# Patient Record
Sex: Female | Born: 1970 | Race: Black or African American | Hispanic: No | Marital: Married | Smoking: Current every day smoker
Health system: Southern US, Community
[De-identification: ages and names within clinical notes are randomized; demographics above are authoritative.]

## PROBLEM LIST (undated history)

## (undated) DIAGNOSIS — D219 Benign neoplasm of connective and other soft tissue, unspecified: Secondary | ICD-10-CM

## (undated) DIAGNOSIS — F419 Anxiety disorder, unspecified: Secondary | ICD-10-CM

## (undated) DIAGNOSIS — D573 Sickle-cell trait: Secondary | ICD-10-CM

## (undated) DIAGNOSIS — D649 Anemia, unspecified: Secondary | ICD-10-CM

## (undated) DIAGNOSIS — J45909 Unspecified asthma, uncomplicated: Secondary | ICD-10-CM

## (undated) DIAGNOSIS — I10 Essential (primary) hypertension: Secondary | ICD-10-CM

## (undated) DIAGNOSIS — E119 Type 2 diabetes mellitus without complications: Secondary | ICD-10-CM

## (undated) HISTORY — DX: Anemia, unspecified: D64.9

## (undated) HISTORY — PX: APPENDECTOMY: SHX54

## (undated) HISTORY — DX: Benign neoplasm of connective and other soft tissue, unspecified: D21.9

## (undated) HISTORY — DX: Sickle-cell trait: D57.3

## (undated) HISTORY — PX: TUBAL LIGATION: SHX77

---

## 2004-12-01 ENCOUNTER — Emergency Department (HOSPITAL_COMMUNITY): Admission: EM | Admit: 2004-12-01 | Discharge: 2004-12-01 | Payer: Self-pay | Admitting: Emergency Medicine

## 2005-04-18 ENCOUNTER — Ambulatory Visit (HOSPITAL_COMMUNITY): Admission: RE | Admit: 2005-04-18 | Discharge: 2005-04-18 | Payer: Self-pay | Admitting: Ophthalmology

## 2005-06-14 ENCOUNTER — Ambulatory Visit (HOSPITAL_COMMUNITY): Admission: RE | Admit: 2005-06-14 | Discharge: 2005-06-14 | Payer: Self-pay | Admitting: Ophthalmology

## 2005-10-19 ENCOUNTER — Ambulatory Visit: Payer: Self-pay | Admitting: Family Medicine

## 2006-01-19 ENCOUNTER — Emergency Department (HOSPITAL_COMMUNITY): Admission: EM | Admit: 2006-01-19 | Discharge: 2006-01-19 | Payer: Self-pay | Admitting: Emergency Medicine

## 2007-09-18 ENCOUNTER — Encounter: Payer: Self-pay | Admitting: Family Medicine

## 2008-01-01 ENCOUNTER — Other Ambulatory Visit: Admission: RE | Admit: 2008-01-01 | Discharge: 2008-01-01 | Payer: Self-pay | Admitting: Family Medicine

## 2008-01-01 ENCOUNTER — Encounter: Payer: Self-pay | Admitting: Family Medicine

## 2008-01-01 ENCOUNTER — Ambulatory Visit: Payer: Self-pay | Admitting: Family Medicine

## 2008-01-07 DIAGNOSIS — Z720 Tobacco use: Secondary | ICD-10-CM

## 2008-01-07 DIAGNOSIS — E669 Obesity, unspecified: Secondary | ICD-10-CM

## 2010-10-11 ENCOUNTER — Emergency Department (HOSPITAL_COMMUNITY)
Admission: EM | Admit: 2010-10-11 | Discharge: 2010-10-12 | Payer: Self-pay | Source: Home / Self Care | Admitting: Emergency Medicine

## 2010-10-12 LAB — HEPATITIS C ANTIBODY: HCV Ab: NEGATIVE

## 2010-10-12 LAB — HIV RAPID SCREEN (BLD OR BODY FLD EXPOSURE): Rapid HIV Screen: NONREACTIVE

## 2010-10-17 NOTE — Letter (Signed)
Summary: Historic Patient File  Historic Patient File   Imported By: Lind Guest 06/16/2010 16:11:50  _____________________________________________________________________  External Attachment:    Type:   Image     Comment:   External Document

## 2010-10-17 NOTE — Letter (Signed)
Summary: rpc chart  rpc chart   Imported By: Curtis Sites 04/17/2010 13:57:11  _____________________________________________________________________  External Attachment:    Type:   Image     Comment:   External Document

## 2011-02-02 NOTE — Op Note (Signed)
[name], [name] NO.:  [id number]   MEDICAL RECORD NO.:  [id number]          PATIENT TYPE:  AMB   LOCATION:  DAY                           FACILITY:  [facility]   PHYSICIAN:  Trish Fountain, MD    DATE OF BIRTH:  12-30-1970   DATE OF PROCEDURE:  04/18/2005  DATE OF DISCHARGE:  04/18/2005                                 OPERATIVE REPORT   /PREOPERATIVE DIAGNOSIS:  Cataract, left eye.   POSTOPERATIVE DIAGNOSIS:  Cataract, left eye.   SURGERY:  Kelman phacoemulsification, left eye, with posterior chamber  intraocular lens, left eye.   ANESTHESIA:  MAC with topical anesthesia of the left eye.   SURGEON:  Trish Fountain, MD   SPECIMENS:  None.   COMPLICATIONS:  None.   HISTORY:  This is a 40 year old female who has progressively decreased  vision in the left eye.   Lens model is AMO CLRFLXC 20.0 diopter lens, serial # [id number].   DESCRIPTION OF PROCEDURE:  In the preoperative area, the patient had  Cyclogyl and Neo-Synephrine drops in the left eye in order to dilate the eye  along with Tetracaine to help anesthetize the eye.  Once the patient's left  eye was dilated, the patient was taken to the operating room and prepped.  The left eye was prepped and draped in the usual sterile manner.  A lid  speculum was placed in the left eye, and 2% Xylocaine jelly was placed in  the left eye as well.  A paracentesis was made through clear cornea at the  limbus at approximately the 5 o'clock position of the left eye.  Nonpreserved Xylocaine 1% 1 cc was placed into the anterior chamber for one  minute.  Viscoat was then used to fill the anterior chamber.  Using a 2.75  mm blade at the 3 o'clock position, an incision into the anterior chamber  was made through clear cornea near the limbus.  Viscoat was again used to  reform the anterior chamber.  A 25 gauge bent capsulotomy needle was used to  begin the capsulorrhexis through the anterior capsule of the lens.   Utrata  forceps were used to make a 360 degree anterior capsulorrhexis.  A Chang 27  gauge irrigating cannula was used to hydrodissect and hydrodelineate the  nucleus.  Once hydrodissection and hydrodelineation was carried out, [facility]  phacoemulsification was used to make a deep groove in the lens nucleus.  The  nucleus was very soft and minimal phaco power and suction were used to  remove the remainder of the lens nucleus.  The irrigation/aspiration was  then used to remove the remainder of the cortex.  An olive-tipped capsule  polisher was used to clean the posterior capsule. Most of the posterior  capsular opacity was adherent to the posterior capsule and was unable to be  removed. The anterior chamber and posterior capsule was filled with Provisc,  and the 3 o'clock position incision was slightly widened, using the same  2.75 mm blade that was initially used to make the incision.  An intraocular  lens was placed in  the shooter, and this was placed in the eye, followed by  placement of the trailing haptic into the posterior capsule, using the  Kugelan.  Irrigation/aspiration was then used to remove Provisc from the  anterior chamber and the posterior capsule.  BSS on a syringe was then used  to hydrate the cornea at the 3 o'clock incision site.  The incision site was  then checked for water tightness, using a Weck-cel.  Half-strength Betadine  solution was placed, 1 drop, in the inner canthus, and 1 drop in the outer  canthus.  After one minute, this was rinsed from the eye.  Drops were placed  in the eye, Vigamox, followed by Nevanac followed by Econopred.  A shield  was placed over the patient's left eye, and the patient was sent to the  recovery room in satisfactory condition.       PVK/MEDQ  D:  04/19/2005  T:  04/20/2005  Job:  [id number]

## 2011-02-02 NOTE — Op Note (Signed)
[name], [name]   MEDICAL RECORD NO.:  [name]          PATIENT TYPE:  AMB   LOCATION:  DAY                           FACILITY:  [facility]   PHYSICIAN:  Trish Fountain, MD    DATE OF BIRTH:  Mar 28, 1971   DATE OF PROCEDURE:  06/14/2005  DATE OF DISCHARGE:                                 OPERATIVE REPORT   PREOPERATIVE DIAGNOSIS:  Cataract, right eye.   POSTOPERATIVE DIAGNOSIS:  Cataract, right eye.   SURGERY:  Kelman phacoemulsification, right eye, with posterior chamber  intraocular lens, right eye.   ANESTHESIA:  MAC with topical anesthesia of the right eye.   SURGEON:  Trish Fountain, MD   SPECIMENS:  None.   COMPLICATIONS:  None.   HISTORY:  This is a 40 year old female who has slowly progressive decrease  in vision in the right eye.   LENS MODEL:  AMO CLRFLXC 20.0 diopter lens, serial #[id number].   DESCRIPTION OF PROCEDURE:  In the preoperative area, the patient had  Cyclogyl and Neo-Synephrine drops in the right eye in order to dilate the  eye along with Tetracaine to help anesthetize the eye.  Once the patient's  right eye was dilated, the patient was taken to the operating room and  prepped.  The right eye was prepped and draped in the usual sterile manner.  A lid speculum was placed in the right eye, and 2% Xylocaine jelly was  placed in the right eye as well.  A paracentesis was made through clear  cornea at the limbus at approximately the 11 o'clock position of the right  eye.  Nonpreserved Xylocaine 1% 1 cc was placed into the anterior chamber  for one minute.  Viscoat was then used to fill the anterior chamber.  Using  a 2.75 mm blade at the 9 o'clock position, an incision into the anterior  chamber was made through clear cornea near the limbus.  Viscoat was again  used to reform the anterior chamber.  A 25 gauge bent capsulotomy needle was  used to begin the capsulorrhexis through the anterior capsule of the  lens.  Utrata forceps were used to make a 360 degree anterior capsulorrhexis.  A  Chang 27 gauge irrigating cannula was used to hydrodissect and  hydrodelineate the nucleus.  Once hydrodissection and hydrodelineation was  carried out, [facility] phacoemulsification was used to make a deep groove in  the lens nucleus.  The lens was rotated 360 degrees and divided into four  quadrants using deep grooves made by phacoemulsification with the [facility]  phacoemulsification tip.  The nucleus was then divided using the phaco tip  and the nucleus manipulator.  The nuclear quadrants were then removed using  phacoemulsification.  The irrigation aspiration was then used to remove the  remainder of the cortex.  A olive-tipped capsule polisher was used to clean  the posterior capsule.  Just as with her left eye, the posterior capsular  opacity remained and will have to be removed at a later date using the YAG  laser capsulotomy.  The anterior chamber and posterior capsule  was filled  with Provisc, and the 9 o'clock position incision was slightly widened,  using the same 2.75 mm blade that was initially used to make the incision.  An intraocular lens was placed in the shooter, and this was placed in the  eye, followed by placement of the trailing haptic into the posterior  capsule, using the Kugelan.  Irrigation/aspiration was then used to remove  Provisc from the anterior chamber and the posterior capsule.  BSS on a  syringe was then used to hydrate the cornea at the 9 o'clock incision site.  The incision site was then checked for water tightness, using a Weck-cel.  Half-strength Betadine solution was placed, 1 drop, in the inner canthus,  and 1 drop in the outer canthus.  After one minute, this was rinsed from the  eye.  Drops were placed in the eye, Vigamox, followed by Nevanac followed by  Econopred.  A shield was placed over the patient's right eye, and the  patient was sent to the recovery room in  satisfactory condition.      Trish Fountain, MD  Electronically Signed     PVK/MEDQ  D:  06/15/2005  T:  06/15/2005  Job:  [id number]

## 2013-05-12 ENCOUNTER — Emergency Department (HOSPITAL_COMMUNITY)
Admission: EM | Admit: 2013-05-12 | Discharge: 2013-05-12 | Disposition: A | Discharge status: Home / Self Care | Payer: Self-pay | Attending: Emergency Medicine | Admitting: Emergency Medicine

## 2013-05-12 ENCOUNTER — Emergency Department (HOSPITAL_COMMUNITY): Payer: Self-pay

## 2013-05-12 ENCOUNTER — Encounter (HOSPITAL_COMMUNITY): Payer: Self-pay | Admitting: *Deleted

## 2013-05-12 DIAGNOSIS — R509 Fever, unspecified: Secondary | ICD-10-CM | POA: Insufficient documentation

## 2013-05-12 DIAGNOSIS — I10 Essential (primary) hypertension: Secondary | ICD-10-CM | POA: Insufficient documentation

## 2013-05-12 DIAGNOSIS — F17200 Nicotine dependence, unspecified, uncomplicated: Secondary | ICD-10-CM | POA: Insufficient documentation

## 2013-05-12 DIAGNOSIS — R319 Hematuria, unspecified: Secondary | ICD-10-CM | POA: Insufficient documentation

## 2013-05-12 DIAGNOSIS — N938 Other specified abnormal uterine and vaginal bleeding: Secondary | ICD-10-CM

## 2013-05-12 DIAGNOSIS — Z7982 Long term (current) use of aspirin: Secondary | ICD-10-CM | POA: Insufficient documentation

## 2013-05-12 DIAGNOSIS — R42 Dizziness and giddiness: Secondary | ICD-10-CM | POA: Insufficient documentation

## 2013-05-12 DIAGNOSIS — N898 Other specified noninflammatory disorders of vagina: Secondary | ICD-10-CM | POA: Insufficient documentation

## 2013-05-12 DIAGNOSIS — N760 Acute vaginitis: Secondary | ICD-10-CM | POA: Insufficient documentation

## 2013-05-12 DIAGNOSIS — Z79899 Other long term (current) drug therapy: Secondary | ICD-10-CM | POA: Insufficient documentation

## 2013-05-12 DIAGNOSIS — A499 Bacterial infection, unspecified: Secondary | ICD-10-CM | POA: Insufficient documentation

## 2013-05-12 DIAGNOSIS — R0602 Shortness of breath: Secondary | ICD-10-CM | POA: Insufficient documentation

## 2013-05-12 DIAGNOSIS — B9689 Other specified bacterial agents as the cause of diseases classified elsewhere: Secondary | ICD-10-CM

## 2013-05-12 DIAGNOSIS — A599 Trichomoniasis, unspecified: Secondary | ICD-10-CM

## 2013-05-12 DIAGNOSIS — D259 Leiomyoma of uterus, unspecified: Secondary | ICD-10-CM

## 2013-05-12 DIAGNOSIS — D649 Anemia, unspecified: Secondary | ICD-10-CM

## 2013-05-12 DIAGNOSIS — R51 Headache: Secondary | ICD-10-CM | POA: Insufficient documentation

## 2013-05-12 DIAGNOSIS — N949 Unspecified condition associated with female genital organs and menstrual cycle: Secondary | ICD-10-CM | POA: Insufficient documentation

## 2013-05-12 HISTORY — DX: Essential (primary) hypertension: I10

## 2013-05-12 LAB — COMPREHENSIVE METABOLIC PANEL
Albumin: 3.4 g/dL — ABNORMAL LOW (ref 3.5–5.2)
Alkaline Phosphatase: 50 U/L (ref 39–117)
BUN: 10 mg/dL (ref 6–23)
Potassium: 3.9 mEq/L (ref 3.5–5.1)
Sodium: 141 mEq/L (ref 135–145)
Total Protein: 7.3 g/dL (ref 6.0–8.3)

## 2013-05-12 LAB — URINALYSIS, ROUTINE W REFLEX MICROSCOPIC
Bilirubin Urine: NEGATIVE
Ketones, ur: NEGATIVE mg/dL
Nitrite: NEGATIVE
Urobilinogen, UA: 0.2 mg/dL (ref 0.0–1.0)

## 2013-05-12 LAB — CBC WITH DIFFERENTIAL/PLATELET
Basophils Absolute: 0.1 10*3/uL (ref 0.0–0.1)
Basophils Relative: 1 % (ref 0–1)
Eosinophils Absolute: 0.3 10*3/uL (ref 0.0–0.7)
MCH: 20 pg — ABNORMAL LOW (ref 26.0–34.0)
MCHC: 31.4 g/dL (ref 30.0–36.0)
Monocytes Relative: 11 % (ref 3–12)
Neutrophils Relative %: 47 % (ref 43–77)
Platelets: 291 10*3/uL (ref 150–400)
RDW: 21.2 % — ABNORMAL HIGH (ref 11.5–15.5)

## 2013-05-12 LAB — WET PREP, GENITAL: Yeast Wet Prep HPF POC: NONE SEEN

## 2013-05-12 LAB — URINE MICROSCOPIC-ADD ON

## 2013-05-12 MED ORDER — FERROUS SULFATE 325 (65 FE) MG PO TABS: 325.0000 mg | ORAL_TABLET | Freq: Three times a day (TID) | ORAL | Status: DC

## 2013-05-12 MED ORDER — METRONIDAZOLE 500 MG PO TABS: 500.0000 mg | ORAL_TABLET | Freq: Three times a day (TID) | ORAL | Status: DC

## 2013-05-12 MED ORDER — KETOROLAC TROMETHAMINE 60 MG/2ML IM SOLN
60.0000 mg | Freq: Once | INTRAMUSCULAR | Status: AC
  Administered 2013-05-12: 60 mg via INTRAMUSCULAR
  Filled 2013-05-12: qty 2

## 2013-05-12 NOTE — ED Provider Notes (Addendum)
CSN: [id number]     Arrival date & time 05/12/13  0932 History  This chart was scribed for Ward Givens, MD,  by Ashley Jacobs, ED Scribe. The patient was seen in room APA04/APA04 and the patient's care was started at 10:07 AM    Chief Complaint  Patient presents with  . Abdominal Pain  . Hypertension   (Consider location/radiation/quality/duration/timing/severity/associated sxs/prior Treatment) Patient is a 41 y.o. female presenting with abdominal pain and hypertension. The history is provided by the patient and medical records. No language interpreter was used.  Abdominal Pain Pain quality: pressure and sharp   Onset quality:  Sudden Duration:  3 days Timing:  Constant Progression:  Worsening Chronicity:  New Relieved by:  Nothing Worsened by:  Urination, movement and position changes Associated symptoms: chills, fever, hematuria, shortness of breath and vaginal bleeding   Associated symptoms: no diarrhea, no dysuria, no nausea and no vomiting   Hypertension Associated symptoms include abdominal pain, headaches and shortness of breath.   HPI Comments: [name] is a 42 y.o. female who presents to the Emergency Department complaining of moderate RLQ abdominal pain and hypertension with onset of three days PTA . Pt describes the pain as pressure in intermittent intervals of 5 minutes and she explains that the pain is exacerbated upon lifting 10lbs spools of yarn at work or by anything she picks up or lifts. She reports associated symptoms of urgency, frequency, heavy vaginal bleeding but not dysuria. She had some hematuria yesterday.   She had ? Fever last night with chills. Pt has headaches for the past 3d that is described as pressure in left side of forehead in intermittent episodes that last only a few minutes and she states trying OTC pain medication with no improvement. Pt also reports yesterday feeling SOB and dizziness with sudden onset and nothing seems to relieve it. She  denies flank pain, nausea and vomiting. She denies hx of ovarian cysts. Pt's last menstrual cycles was one week ago. She does have a hx of tubal ligation and G4/P4/ Ab0. She has had some mild blood/discharge recently   She currently smokes 0 .5 pack of cigarettes a day and denies drinking alcohol.    Pt's goes to [facility].   Past Medical History  Diagnosis Date  . Hypertension    Past Surgical History  Procedure Laterality Date  . Tubal ligation     No family history on file. History  Substance Use Topics  . Smoking status: Current Some Day Smoker  . Smokeless tobacco: Not on file  . Alcohol Use: No  employed Lives at home Smokes 1/2 ppd   OB History   Grav Para Term Preterm Abortions TAB SAB Ect Mult Living                 Review of Systems  Constitutional: Positive for fever and chills.  Eyes: Negative for visual disturbance.  Respiratory: Positive for shortness of breath.   Gastrointestinal: Positive for abdominal pain. Negative for nausea, vomiting and diarrhea.  Genitourinary: Positive for urgency, hematuria and vaginal bleeding. Negative for dysuria and flank pain.  Neurological: Positive for dizziness and headaches. Negative for numbness.  All other systems reviewed and are negative.    Allergies  Review of patient's allergies indicates no known allergies.  Home Medications   Current Outpatient Rx  Name  Route  Sig  Dispense  Refill  . aspirin EC 81 MG tablet   Oral   Take 81  mg by mouth daily as needed for pain.         . Aspirin-Acetaminophen-Caffeine (GOODY HEADACHE PO)   Oral   Take 1 packet by mouth every 12 (twelve) hours as needed. Patient took yesterday for cramping         . Hyprom-Naphaz-Polysorb-Zn Sulf (CLEAR EYES COMPLETE) SOLN   Ophthalmic   Apply 1 drop to eye daily.         . ferrous sulfate 325 (65 FE) MG tablet   Oral   Take 1 tablet (325 mg total) by mouth 3 (three) times daily with meals.   100  tablet   0   . metroNIDAZOLE (FLAGYL) 500 MG tablet   Oral   Take 1 tablet (500 mg total) by mouth 3 (three) times daily.   21 tablet   0    BP 146/77  Pulse 63  Temp(Src) 98.5 F (36.9 C) (Oral)  Resp 20  Ht 5\' 3"  (1.6 m)  Wt 188 lb 2 oz (85.333 kg)  BMI 33.33 kg/m2  SpO2 100%  LMP 05/03/2013  Vital signs normal   Physical Exam  Nursing note and vitals reviewed. Constitutional: She is oriented to person, place, and time. She appears well-developed and well-nourished.  Non-toxic appearance. She does not appear ill. No distress.  HENT:  Head: Normocephalic and atraumatic.  Right Ear: External ear normal.  Left Ear: External ear normal.  Nose: Nose normal. No mucosal edema or rhinorrhea.  Mouth/Throat: Oropharynx is clear and moist and mucous membranes are normal. No dental abscesses or edematous.  Eyes: Conjunctivae and EOM are normal. Pupils are equal, round, and reactive to light.  Neck: Normal range of motion and full passive range of motion without pain. Neck supple.  Cardiovascular: Normal rate, regular rhythm and normal heart sounds.  Exam reveals no gallop and no friction rub.   No murmur heard. Pulmonary/Chest: Effort normal and breath sounds normal. No respiratory distress. She has no wheezes. She has no rhonchi. She has no rales. She exhibits no tenderness and no crepitus.  Abdominal: Soft. Normal appearance and bowel sounds are normal. She exhibits no distension. There is tenderness (RLQ). There is no rebound and no guarding.  Genitourinary:  Normal external genitalia, patient's uterus feels enlarged and is slightly knobby i to palpation, she is most tender over her uterus. She has mild discomfort to palpation over the right ovary, she is nontender over the left ovary, the ovaries are not easily felt. There is some white discharge.  Musculoskeletal: Normal range of motion. She exhibits no edema and no tenderness.  Moves all extremities well.  No CVA T bilaterally    Neurological: She is alert and oriented to person, place, and time. She has normal strength. No cranial nerve deficit.  Skin: Skin is warm, dry and intact. No rash noted. No erythema. No pallor.  Psychiatric: She has a normal mood and affect. Her speech is normal and behavior is normal. Her mood appears not anxious.    ED Course  Procedures (including critical care time) Medications  ketorolac (TORADOL) injection 60 mg (60 mg Intramuscular Given 05/12/13 1034)    DIAGNOSTIC STUDIES: Oxygen Saturation is 100% on room air, normal by my interpretation.    COORDINATION OF CARE: 10:16 AM Discussed course of care with pt which includes CT scan, EKG and ultrasound . Pt understands and agrees.  We discussed her test results, she reports heavy vaginal bleeding. She reports she bleeds through a tampon and 3 pads at one  time. She has BTL and states she has grandchildren and doesn't want any more children.   We discussed her anemia and her bleeding and lower abdominal discomfort most likely from her fibroids. She needs to discuss with GYN if she would benefit from hysterectomy.     Labs Review Results for orders placed during the hospital encounter of 05/12/13  WET PREP, GENITAL      Result Value Range   Yeast Wet Prep HPF POC NONE SEEN  NONE SEEN   Trich, Wet Prep MANY (*) NONE SEEN   Clue Cells Wet Prep HPF POC FEW (*) NONE SEEN   WBC, Wet Prep HPF POC FEW (*) NONE SEEN  URINALYSIS, ROUTINE W REFLEX MICROSCOPIC      Result Value Range   Color, Urine YELLOW  YELLOW   APPearance CLEAR  CLEAR   Specific Gravity, Urine 1.015  1.005 - 1.030   pH 7.0  5.0 - 8.0   Glucose, UA NEGATIVE  NEGATIVE mg/dL   Hgb urine dipstick NEGATIVE  NEGATIVE   Bilirubin Urine NEGATIVE  NEGATIVE   Ketones, ur NEGATIVE  NEGATIVE mg/dL   Protein, ur TRACE (*) NEGATIVE mg/dL   Urobilinogen, UA 0.2  0.0 - 1.0 mg/dL   Nitrite NEGATIVE  NEGATIVE   Leukocytes, UA NEGATIVE  NEGATIVE  CBC WITH DIFFERENTIAL       Result Value Range   WBC 6.1  4.0 - 10.5 K/uL   RBC 4.61  3.87 - 5.11 MIL/uL   Hemoglobin 9.2 (*) 12.0 - 15.0 g/dL   HCT 29.5 (*) 62.1 - 30.8 %   MCV 63.6 (*) 78.0 - 100.0 fL   MCH 20.0 (*) 26.0 - 34.0 pg   MCHC 31.4  30.0 - 36.0 g/dL   RDW 65.7 (*) 84.6 - 96.2 %   Platelets 291  150 - 400 K/uL   Neutrophils Relative % 47  43 - 77 %   Neutro Abs 2.9  1.7 - 7.7 K/uL   Lymphocytes Relative 36  12 - 46 %   Lymphs Abs 2.2  0.7 - 4.0 K/uL   Monocytes Relative 11  3 - 12 %   Monocytes Absolute 0.7  0.1 - 1.0 K/uL   Eosinophils Relative 6 (*) 0 - 5 %   Eosinophils Absolute 0.3  0.0 - 0.7 K/uL   Basophils Relative 1  0 - 1 %   Basophils Absolute 0.1  0.0 - 0.1 K/uL  COMPREHENSIVE METABOLIC PANEL      Result Value Range   Sodium 141  135 - 145 mEq/L   Potassium 3.9  3.5 - 5.1 mEq/L   Chloride 107  96 - 112 mEq/L   CO2 27  19 - 32 mEq/L   Glucose, Bld 93  70 - 99 mg/dL   BUN 10  6 - 23 mg/dL   Creatinine, Ser 9.52  0.50 - 1.10 mg/dL   Calcium 9.2  8.4 - 84.1 mg/dL   Total Protein 7.3  6.0 - 8.3 g/dL   Albumin 3.4 (*) 3.5 - 5.2 g/dL   AST 16  0 - 37 U/L   ALT 10  0 - 35 U/L   Alkaline Phosphatase 50  39 - 117 U/L   Total Bilirubin 0.2 (*) 0.3 - 1.2 mg/dL   GFR calc non Af Amer >90  >90 mL/min   GFR calc Af Amer >90  >90 mL/min  URINE MICROSCOPIC-ADD ON      Result Value Range   Squamous Epithelial /  LPF MANY (*) RARE   WBC, UA 0-2  <3 WBC/hpf   Bacteria, UA MANY (*) RARE   Laboratory interpretation all normal except Bacterial vaginosis, trichomoniasis, anemia   US Transvaginal Non-ob US Pelvis Complete  05/12/2013   *RADIOLOGY REPORT*  Clinical Data: History of right-sided pain. LMP 05/03/2013.  No hormonal therapy.  TRANSABDOMINAL AND TRANSVAGINAL ULTRASOUND OF PELVIS Technique:  Both transabdominal and transvaginal ultrasound examinations of the pelvis were performed. Transabdominal technique was performed for global imaging of the pelvis including uterus, ovaries, adnexal  regions, and pelvic cul-de-sac.  It was necessary to proceed with endovaginal exam following the transabdominal exam to visualize the  details of the endometrium and myometrium and to attempt to evaluate the ovaries.  Comparison:  None  Findings:  Uterus: There is mild uterine anteversion. The uterus measures 12.9 x 8.8 x 8.0 cm in diameter.  Multiple myometrial nodular areas are seen consistent with uterine leiomyomas which appear intramural and measure 5.7 x 5.5 x 4.9 cm, 5.2 x 5.0 x 4.2 cm, and 3.4 x 3.1 x 2.5 cm in diameter respectively.  Endometrium: Endometrial thickness measures 7.4 mm.  No endometrial mass or fluid collection is evident.  Right ovary:  Right ovary cannot be visualized.  No right adnexal lesion was identified.  Left ovary: Left ovary cannot be visualized.  No left adnexal lesion was identified.  Other findings: No free fluid was seen in the pelvis.  IMPRESSION: Uterus measures 12.9 x 8.8 x 8.0 cm.  Multiple myometrial nodular areas are seen consistent with uterine leiomyomas.  See above for measurements.  Ovaries cannot be visualized.  No adnexal masses are identified.   Original Report Authenticated By: Onalee Hua Call     Date: 05/12/2013  Rate: 51  Rhythm: sinus bradycardia  QRS Axis: normal  Intervals: normal  ST/T Wave abnormalities: normal  Conduction Disutrbances:none  Narrative Interpretation: PRWP  Old EKG Reviewed: none available      MDM   1. DUB (dysfunctional uterine bleeding)   2. Fibroid, uterine   3. BV (bacterial vaginosis)   4. Trichomoniasis   5. Anemia    Discharge Medication List as of 05/12/2013  2:04 PM    START taking these medications   Details  ferrous sulfate 325 (65 FE) MG tablet Take 1 tablet (325 mg total) by mouth 3 (three) times daily with meals., Starting 05/12/2013, Until Discontinued, Print    metroNIDAZOLE (FLAGYL) 500 MG tablet Take 1 tablet (500 mg total) by mouth 3 (three) times daily., Starting 05/12/2013, Until Discontinued,  Print        Plan discharge   Devoria Albe, MD, FACEP   I personally performed the services described in this documentation, which was scribed in my presence. The recorded information has been reviewed and considered.  Devoria Albe, MD, FACEP    Ward Givens, MD 05/12/13 1417  Ward Givens, MD 05/12/13 5409

## 2013-05-12 NOTE — ED Notes (Signed)
Pt given discharge instructions and verbalized understanding. NAD at this time.  

## 2013-05-12 NOTE — ED Notes (Addendum)
Pt presents to er with c/o dizzy spells for the past three days, right lower quad abd pain, blood in urine, increase in urine frequency for the past three days, nausea. Admits to having headache three days ago, denies any headache at present,

## 2013-05-14 LAB — GC/CHLAMYDIA PROBE AMP: CT Probe RNA: NEGATIVE

## 2013-06-09 ENCOUNTER — Ambulatory Visit (INDEPENDENT_AMBULATORY_CARE_PROVIDER_SITE_OTHER): Payer: BC Managed Care – PPO | Admitting: Obstetrics and Gynecology

## 2013-06-09 ENCOUNTER — Encounter: Payer: Self-pay | Admitting: Obstetrics and Gynecology

## 2013-06-09 VITALS — BP 150/80 | Ht 63.0 in | Wt 188.8 lb

## 2013-06-09 DIAGNOSIS — N938 Other specified abnormal uterine and vaginal bleeding: Secondary | ICD-10-CM

## 2013-06-09 DIAGNOSIS — N949 Unspecified condition associated with female genital organs and menstrual cycle: Secondary | ICD-10-CM

## 2013-06-09 DIAGNOSIS — D259 Leiomyoma of uterus, unspecified: Secondary | ICD-10-CM

## 2013-06-09 MED ORDER — MEGESTROL ACETATE 40 MG PO TABS: 40.0000 mg | ORAL_TABLET | Freq: Three times a day (TID) | ORAL | Status: DC | PRN

## 2013-06-09 NOTE — Patient Instructions (Signed)
Fibroids Fibroids are lumps (tumors) that can occur any place in a woman's body. These lumps are not cancerous. Fibroids vary in size, weight, and where they grow. HOME CARE  Do not take aspirin.  Write down the number of pads or tampons you use during your period. Tell your doctor. This can help determine the best treatment for you. GET HELP RIGHT AWAY IF:  You have pain in your lower belly (abdomen) that is not helped with medicine.  You have cramps that are not helped with medicine.  You have more bleeding between or during your period.  You feel lightheaded or pass out (faint).  Your lower belly pain gets worse. MAKE SURE YOU:  Understand these instructions.  Will watch your condition.  Will get help right away if you are not doing well or get worse. Document Released: 10/06/2010 Document Revised: 11/26/2011 Document Reviewed: 10/06/2010 [employer] Patient Information 2014 [employer].  

## 2013-06-17 DIAGNOSIS — D259 Leiomyoma of uterus, unspecified: Secondary | ICD-10-CM | POA: Insufficient documentation

## 2013-06-18 ENCOUNTER — Telehealth: Payer: Self-pay | Admitting: *Deleted

## 2013-07-08 NOTE — Progress Notes (Addendum)
Late entry: Patient was seen 06/09/2013 for dysfunctional uterine bleeding and uterine fibroids. Review of ultrasound shows approximately 400 g uterus, making medical efforts to control the irregular bleeding unlikely to be successful similarly endometrial ablation considered low success rate. Fibroids and ultrasound results reviewed with patient. She is return in 4 weeks after consideration of options  Patient was called and message left on her cell phone for patiet call the office. Do not see the 4 week visit scheduled as per discharge instructions at her last visit. The patient has called once earlier this month, and encouraged to call back

## 2013-07-09 ENCOUNTER — Telehealth: Payer: Self-pay

## 2013-07-09 NOTE — Telephone Encounter (Signed)
Per Dr. Emelda Fear pt need appt to be evaluated for symptoms. Call transferred to front staff for an appt to be made.

## 2013-07-14 ENCOUNTER — Ambulatory Visit: Payer: Self-pay | Admitting: Obstetrics and Gynecology

## 2013-07-20 ENCOUNTER — Encounter: Payer: Self-pay | Admitting: Obstetrics & Gynecology

## 2013-07-20 ENCOUNTER — Encounter (INDEPENDENT_AMBULATORY_CARE_PROVIDER_SITE_OTHER): Payer: Self-pay

## 2013-07-20 ENCOUNTER — Ambulatory Visit (INDEPENDENT_AMBULATORY_CARE_PROVIDER_SITE_OTHER): Payer: Self-pay | Admitting: Obstetrics & Gynecology

## 2013-07-20 VITALS — BP 160/90 | Ht 63.0 in | Wt 187.0 lb

## 2013-07-20 DIAGNOSIS — N946 Dysmenorrhea, unspecified: Secondary | ICD-10-CM

## 2013-07-20 DIAGNOSIS — N920 Excessive and frequent menstruation with regular cycle: Secondary | ICD-10-CM | POA: Insufficient documentation

## 2013-07-20 DIAGNOSIS — D649 Anemia, unspecified: Secondary | ICD-10-CM

## 2013-07-20 LAB — POCT HEMOGLOBIN: Hemoglobin: 9.1 g/dL — AB (ref 12.2–16.2)

## 2013-07-20 MED ORDER — HYDROCODONE-ACETAMINOPHEN 5-325 MG PO TABS: 1.0000 | ORAL_TABLET | Freq: Four times a day (QID) | ORAL | Status: DC | PRN

## 2013-07-20 NOTE — Progress Notes (Signed)
Patient ID: [name], female   DOB: 1970/10/02, 42 y.o.   MRN: [id number] I reviewed the sonogram images and none of the myomas are impinging or altering the endometrial cavity Pt would like to try and ablation even if it ultimately fails as an intermediate step in therapy Stay on megace Scheduled for 08/07/2013

## 2013-07-24 ENCOUNTER — Ambulatory Visit: Payer: Self-pay | Admitting: Obstetrics and Gynecology

## 2013-07-30 NOTE — Patient Instructions (Signed)
[name]  07/30/2013   Your procedure is scheduled on: 08/07/2013  Report to [facility] at  615  AM.  Call this number if you have problems the morning of surgery: [phone]   Remember:   Do not eat food or drink liquids after midnight.   Take these medicines the morning of surgery with A SIP OF WATER:  norco   Do not wear jewelry, make-up or nail polish.  Do not wear lotions, powders, or perfumes.   Do not shave 48 hours prior to surgery. Men may shave face and neck.  Do not bring valuables to the hospital.  [facility] is not responsible for any belongings or valuables.               Contacts, dentures or bridgework may not be worn into surgery.  Leave suitcase in the car. After surgery it may be brought to your room.  For patients admitted to the hospital, discharge time is determined by your treatment team.               Patients discharged the day of surgery will not be allowed to drive home.  Name and phone number of your driver: family  Special Instructions: Shower using CHG 2 nights before surgery and the night before surgery.  If you shower the day of surgery use CHG.  Use special wash - you have one bottle of CHG for all showers.  You should use approximately 1/3 of the bottle for each shower.   Please read over the following fact sheets that you were given: Pain Booklet, Coughing and Deep Breathing, Surgical Site Infection Prevention, Anesthesia Post-op Instructions and Care and Recovery After Surgery Hysteroscopy Hysteroscopy is a procedure used for looking inside the womb (uterus). It may be done for many different reasons, including:  To evaluate abnormal bleeding, fibroid (benign, noncancerous) tumors, polyps, scar tissue (adhesions), and possibly cancer of the uterus.  To look for lumps (tumors) and other uterine growths.  To look for causes of why a woman cannot get pregnant (infertility), causes of recurrent loss of pregnancy  (miscarriages), or a lost intrauterine device (IUD).  To perform a sterilization by blocking the fallopian tubes from inside the uterus. A hysteroscopy should be done right after a menstrual period to be sure you are not pregnant. LET YOUR CAREGIVER KNOW ABOUT:   Allergies.  Medicines taken, including herbs, eyedrops, over-the-counter medicines, and creams.  Use of steroids (by mouth or creams).  Previous problems with anesthetics or numbing medicines.  History of bleeding or blood problems.  History of blood clots.  Possibility of pregnancy, if this applies.  Previous surgery.  Other health problems. RISKS AND COMPLICATIONS   Putting a hole in the uterus.  Excessive bleeding.  Infection.  Damage to the cervix.  Injury to other organs.  Allergic reaction to medicines.  Too much fluid used in the uterus for the procedure. BEFORE THE PROCEDURE   Do not take aspirin or blood thinners for a week before the procedure, or as directed. It can cause bleeding.  Arrive at least 60 minutes before the procedure or as directed to read and sign the necessary forms.  Arrange for someone to take you home after the procedure.  If you smoke, do not smoke for 2 weeks before the procedure. PROCEDURE   Your caregiver may give you medicine to relax you. He or she may also give you a medicine that  numbs the area around the cervix (local anesthetic) or a medicine that makes you sleep (general anesthesia).  Sometimes, a medicine is placed in the cervix the day before the procedure. This medicine makes the cervix have a larger opening (dilate). This makes it easier for the instrument to be inserted into the uterus.  A small instrument (hysteroscope) is inserted through the vagina into the uterus. This instrument is similar to a pencil-sized telescope with a light.  During the procedure, air or a liquid is put into the uterus, which allows the surgeon to see better.  Sometimes, tissue  is gently scraped from inside the uterus. These tissue samples are sent to a specialist who looks at tissue samples (pathologist). The pathologist will give a report to your caregiver. This will help your caregiver decide if further treatment is necessary. The report will also help your caregiver decide on the best treatment if the test comes back abnormal. AFTER THE PROCEDURE   If you had a general anesthetic, you may be groggy for a couple hours after the procedure.  If you had a local anesthetic, you will be advised to rest at the surgical center or caregiver's office until you are stable and feel ready to go home.  You may have some cramping for a couple days.  You may have bleeding, which varies from light spotting for a few days to menstrual-like bleeding for up to 3 to 7 days. This is normal.  Have someone take you home. FINDING OUT THE RESULTS OF YOUR TEST Not all test results are available during your visit. If your test results are not back during the visit, make an appointment with your caregiver to find out the results. Do not assume everything is normal if you have not heard from your caregiver or the medical facility. It is important for you to follow up on all of your test results. HOME CARE INSTRUCTIONS   Do not drive for 24 hours or as instructed.  Only take over-the-counter or prescription medicines for pain, discomfort, or fever as directed by your caregiver.  Do not take aspirin. It can cause or aggravate bleeding.  Do not drive or drink alcohol while taking pain medicine.  You may resume your usual diet.  Do not use tampons, douche, or have sexual intercourse for 2 weeks, or as advised by your caregiver.  Rest and sleep for the first 24 to 48 hours.  Take your temperature twice a day for 4 to 5 days. Write it down. Give these temperatures to your caregiver if they are abnormal (above 98.6 F or 37.0 C).  Take medicines your caregiver has ordered as  directed.  Follow your caregiver's advice regarding diet, exercise, lifting, driving, and general activities.  Take showers instead of baths for 2 weeks, or as recommended by your caregiver.  If you develop constipation:  Take a mild laxative with the advice of your caregiver.  Eat bran foods.  Drink enough water and fluids to keep your urine clear or pale yellow.  Try to have someone with you or available to you for the first 24 to 48 hours, especially if you had a general anesthetic.  Make sure you and your family understand everything about your operation and recovery.  Follow your caregiver's advice regarding follow-up appointments and Pap smears. SEEK MEDICAL CARE IF:   You feel dizzy or lightheaded.  You feel sick to your stomach (nauseous).  You develop abnormal vaginal discharge.  You develop a rash.  You  have an abnormal reaction or allergy to your medicine.  You need stronger pain medicine. SEEK IMMEDIATE MEDICAL CARE IF:   Bleeding is heavier than a normal menstrual period or you have blood clots.  You have an oral temperature above 102 F (38.9 C), not controlled by medicine.  You have increasing cramps or pains not relieved with medicine.  You develop belly (abdominal) pain that does not seem to be related to the same area of earlier cramping and pain.  You pass out.  You develop pain in the tops of your shoulders (shoulder strap areas).  You develop shortness of breath. MAKE SURE YOU:   Understand these instructions.  Will watch your condition.  Will get help right away if you are not doing well or get worse. Document Released: 12/10/2000 Document Revised: 11/26/2011 Document Reviewed: 04/02/2013 [facility] Patient Information 2014 [place], [place]. Endometrial Ablation Endometrial ablation removes the lining of the uterus (endometrium). It is usually a same-day, outpatient treatment. Ablation helps avoid major surgery, such as surgery to remove  the cervix and uterus (hysterectomy). After endometrial ablation, you will have little or no menstrual bleeding and may not be able to have children. However, if you are premenopausal, you will need to use a reliable method of birth control following the procedure because of the small chance that pregnancy can occur. There are different reasons to have this procedure, which include:  Heavy periods.  Bleeding that is causing anemia.  Irregular bleeding.  Bleeding fibroids on the lining inside the uterus if they are smaller than 3 centimeters. This procedure should not be done if:  You want children in the future.  You have severe cramps with your menstrual period.  You have precancerous or cancerous cells in your uterus.  You were recently pregnant.  You have gone through menopause.  You have had major surgery on the uterus, such as a cesarean delivery. LET [facility] CARE PROVIDER KNOW ABOUT:  Any allergies you have.  All medicines you are taking, including vitamins, herbs, eye drops, creams, and over-the-counter medicines.  Previous problems you or members of your family have had with the use of anesthetics.  Any blood disorders you have.  Previous surgeries you have had.  Medical conditions you have. RISKS AND COMPLICATIONS  Generally, this is a safe procedure. However, as with any procedure, complications can occur. Possible complications include:  Perforation of the uterus.  Bleeding.  Infection of the uterus, bladder, or vagina.  Injury to surrounding organs.  An air bubble to the lung (air embolus).  Pregnancy following the procedure.  Failure of the procedure to help the problem, requiring hysterectomy.  Decreased ability to diagnose cancer in the lining of the uterus. BEFORE THE PROCEDURE  The lining of the uterus must be tested to make sure there is no pre-cancerous or cancer cells present.  An ultrasound may be performed to look at the size of the  uterus and to check for abnormalities.  Medicines may be given to thin the lining of the uterus. PROCEDURE  During the procedure, your health care provider will use a tool called a resectoscope to help see inside your uterus. There are different ways to remove the lining of your uterus.   Radiofrequency  This method uses a radiofrequency-alternating electric current to remove the lining of the uterus.  Cryotherapy This method uses extreme cold to freeze the lining of the uterus.  Heated-Free Liquid  This method uses heated salt (saline) solution to remove the lining of  the uterus.  Microwave This method uses high-energy microwaves to heat up the lining of the uterus to remove it.  Thermal balloon  This method involves inserting a catheter with a balloon tip into the uterus. The balloon tip is filled with heated fluid to remove the lining of the uterus. AFTER THE PROCEDURE  After your procedure, do not have sexual intercourse or insert anything into your vagina until permitted by your health care provider. After the procedure, you may experience:  Cramps.  Vaginal discharge.  Frequent urination. Document Released: 07/13/2004 Document Revised: 05/06/2013 Document Reviewed: 02/04/2013 [facility] Patient Information 2014 [place], [place]. Dilation and Curettage or Vacuum Curettage Dilation and curettage (D&C) and vacuum curettage are minor procedures. A D&C involves stretching (dilation) the cervix and scraping (curettage) the inside lining of the womb (uterus). During a D&C, tissue is gently scraped from the inside lining of the uterus. During a vacuum curettage, the lining and tissue in the uterus are removed with the use of gentle suction.  Curettage may be performed to either diagnose or treat a problem. As a diagnostic procedure, curettage is performed to examine tissues from the uterus. A diagnostic curettage may be performed for the following symptoms:   Irregular bleeding in the uterus.    Bleeding with the development of clots.   Spotting between menstrual periods.   Prolonged menstrual periods.   Bleeding after menopause.   No menstrual period (amenorrhea).   A change in size and shape of the uterus.  As a treatment procedure, curettage may be performed for the following reasons:   Removal of an IUD (intrauterine device).   Removal of retained placenta after giving birth. Retained placenta can cause an infection or bleeding severe enough to require transfusions.   Abortion.   Miscarriage.   Removal of polyps inside the uterus.   Removal of uncommon types of noncancerous lumps (fibroids).  LET [facility] CARE PROVIDER KNOW ABOUT:   Any allergies you have.   All medicines you are taking, including vitamins, herbs, eye drops, creams, and over-the-counter medicines.   Previous problems you or members of your family have had with the use of anesthetics.   Any blood disorders you have.   Previous surgeries you have had.   Medical conditions you have. RISKS AND COMPLICATIONS  Generally, this is a safe procedure. However, as with any procedure, complications can occur. Possible complications include:  Excessive bleeding.   Infection of the uterus.   Damage to the cervix.   Development of scar tissue (adhesions) inside the uterus, later causing abnormal amounts of menstrual bleeding.   Complications from the general anesthetic, if a general anesthetic is used.   Putting a hole (perforation) in the uterus. This is rare.  BEFORE THE PROCEDURE   Eat and drink before the procedure only as directed by your health care provider.   Arrange for someone to take you home.  PROCEDURE  This procedure usually takes about 15 30 minutes.  You will be given one of the following:  A medicine that numbs the area in and around the cervix (local anesthetic).   A medicine to make you sleep through the procedure (general  anesthetic).  You will lie on your back with your legs in stirrups.   A warm metal or plastic instrument (speculum) will be placed in your vagina to keep it open and to allow the health care provider to see the cervix.  There are two ways in which your cervix can be softened and  dilated. These include:   Taking a medicine.   Having thin rods (laminaria) inserted into your cervix.   A curved tool (curette) will be used to scrape cells from the inside lining of the uterus. In some cases, gentle suction is applied with the curette. The curette will then be removed.  AFTER THE PROCEDURE   You will rest in the recovery area until you are stable and are ready to go home.   You may feel sick to your stomach (nauseous) or throw up (vomit) if you were given a general anesthetic.   You may have a sore throat if a tube was placed in your throat during general anesthesia.   You may have light cramping and bleeding. This may last for 2 days to 2 weeks after the procedure.   Your uterus needs to make a new lining after the procedure. This may make your next period late. Document Released: 09/03/2005 Document Revised: 05/06/2013 Document Reviewed: 04/02/2013 [facility] Patient Information 2014 [place], [place]. PATIENT INSTRUCTIONS POST-ANESTHESIA  IMMEDIATELY FOLLOWING SURGERY:  Do not drive or operate machinery for the first twenty four hours after surgery.  Do not make any important decisions for twenty four hours after surgery or while taking narcotic pain medications or sedatives.  If you develop intractable nausea and vomiting or a severe headache please notify your doctor immediately.  FOLLOW-UP:  Please make an appointment with your surgeon as instructed. You do not need to follow up with anesthesia unless specifically instructed to do so.  WOUND CARE INSTRUCTIONS (if applicable):  Keep a dry clean dressing on the anesthesia/puncture wound site if there is drainage.  Once the wound  has quit draining you may leave it open to air.  Generally you should leave the bandage intact for twenty four hours unless there is drainage.  If the epidural site drains for more than 36-48 hours please call the anesthesia department.  QUESTIONS?:  Please feel free to call your physician or the hospital operator if you have any questions, and they will be happy to assist you.

## 2013-07-31 ENCOUNTER — Encounter (HOSPITAL_COMMUNITY)
Admission: RE | Admit: 2013-07-31 | Discharge: 2013-07-31 | Disposition: A | Discharge status: Home / Self Care | Payer: BC Managed Care – PPO | Source: Ambulatory Visit

## 2013-08-07 ENCOUNTER — Ambulatory Visit (HOSPITAL_COMMUNITY): Admission: RE | Admit: 2013-08-07 | Payer: Self-pay | Source: Ambulatory Visit | Admitting: Obstetrics & Gynecology

## 2013-08-07 ENCOUNTER — Encounter (HOSPITAL_COMMUNITY): Admission: RE | Payer: Self-pay | Source: Ambulatory Visit

## 2013-08-07 SURGERY — DILATATION & CURETTAGE/HYSTEROSCOPY WITH THERMACHOICE ABLATION
Anesthesia: General

## 2013-08-12 ENCOUNTER — Telehealth: Payer: Self-pay | Admitting: Obstetrics and Gynecology

## 2013-08-12 NOTE — Telephone Encounter (Signed)
Voice mailbox not set up @ 12:40. JSY

## 2013-08-17 ENCOUNTER — Telehealth: Payer: Self-pay | Admitting: Obstetrics & Gynecology

## 2013-08-17 ENCOUNTER — Encounter: Payer: BC Managed Care – PPO | Admitting: Obstetrics & Gynecology

## 2013-08-17 NOTE — Telephone Encounter (Signed)
Pt requesting pain medication and Megace. Pt states scheduled for surgery but had to cancel due to work. Pt states pain not as severe but still feels she needs hydrocodone. Pt informed will need an appt to discuss pain management with Dr. Emelda Fear or Dr. Despina Hidden. Call transferred to front staff for an appt to be made this week.

## 2013-08-18 ENCOUNTER — Other Ambulatory Visit: Payer: Self-pay | Admitting: *Deleted

## 2013-08-19 ENCOUNTER — Other Ambulatory Visit: Payer: Self-pay | Admitting: *Deleted

## 2013-08-19 MED ORDER — MEGESTROL ACETATE 40 MG PO TABS: 40.0000 mg | ORAL_TABLET | Freq: Three times a day (TID) | ORAL | Status: DC | PRN

## 2013-08-19 NOTE — Telephone Encounter (Signed)
Left message x 1. JSY 

## 2013-08-20 ENCOUNTER — Ambulatory Visit: Payer: Self-pay | Admitting: Adult Health

## 2013-08-20 ENCOUNTER — Encounter (INDEPENDENT_AMBULATORY_CARE_PROVIDER_SITE_OTHER): Payer: Self-pay

## 2013-08-21 NOTE — Telephone Encounter (Signed)
Left message at both numbers. JSY

## 2013-08-25 NOTE — Telephone Encounter (Signed)
Pt requesting refill on Hydrocodone for side pain.

## 2013-08-25 NOTE — Telephone Encounter (Signed)
Left message. JSY 

## 2013-08-26 MED ORDER — HYDROCODONE-ACETAMINOPHEN 5-325 MG PO TABS: 1.0000 | ORAL_TABLET | Freq: Four times a day (QID) | ORAL | Status: DC | PRN

## 2013-08-27 NOTE — Telephone Encounter (Signed)
Pt informed RX for Hydrocodone left at front desk for pt to pick up.

## 2014-01-27 ENCOUNTER — Emergency Department (HOSPITAL_COMMUNITY): Payer: BC Managed Care – PPO

## 2014-01-27 ENCOUNTER — Emergency Department (HOSPITAL_COMMUNITY)
Admission: EM | Admit: 2014-01-27 | Discharge: 2014-01-27 | Disposition: A | Discharge status: Home / Self Care | Payer: BC Managed Care – PPO | Attending: Emergency Medicine | Admitting: Emergency Medicine

## 2014-01-27 ENCOUNTER — Encounter (HOSPITAL_COMMUNITY): Payer: Self-pay | Admitting: Emergency Medicine

## 2014-01-27 DIAGNOSIS — F17200 Nicotine dependence, unspecified, uncomplicated: Secondary | ICD-10-CM | POA: Insufficient documentation

## 2014-01-27 DIAGNOSIS — Z79899 Other long term (current) drug therapy: Secondary | ICD-10-CM | POA: Insufficient documentation

## 2014-01-27 DIAGNOSIS — M7989 Other specified soft tissue disorders: Secondary | ICD-10-CM

## 2014-01-27 DIAGNOSIS — L03116 Cellulitis of left lower limb: Secondary | ICD-10-CM

## 2014-01-27 DIAGNOSIS — L02419 Cutaneous abscess of limb, unspecified: Secondary | ICD-10-CM | POA: Insufficient documentation

## 2014-01-27 DIAGNOSIS — D649 Anemia, unspecified: Secondary | ICD-10-CM | POA: Insufficient documentation

## 2014-01-27 DIAGNOSIS — Z792 Long term (current) use of antibiotics: Secondary | ICD-10-CM | POA: Insufficient documentation

## 2014-01-27 DIAGNOSIS — L03119 Cellulitis of unspecified part of limb: Principal | ICD-10-CM

## 2014-01-27 DIAGNOSIS — I10 Essential (primary) hypertension: Secondary | ICD-10-CM | POA: Insufficient documentation

## 2014-01-27 MED ORDER — DOXYCYCLINE HYCLATE 100 MG PO TABS: 100.0000 mg | ORAL_TABLET | Freq: Two times a day (BID) | ORAL | Status: DC

## 2014-01-27 NOTE — ED Provider Notes (Signed)
CSN: [id number]     Arrival date & time 01/27/14  0730 History   First MD Initiated Contact with Patient 01/27/14 [phone]     Chief Complaint  Patient presents with  . Leg Swelling      HPI Pt was seen at 0740. Per pt, c/o gradual onset and persistence of constant LLE "swelling" and "soreness" since yesterday. Pt states her job entails "working on my feet," but her RLE is not swollen. Denies injury, no joints pain, no fevers, no SOB, no abd pain, no back pain.    Past Medical History  Diagnosis Date  . Hypertension   . Anemia   . Sickle cell trait    Past Surgical History  Procedure Laterality Date  . Tubal ligation     Family History  Problem Relation Age of Onset  . Diabetes Father    History  Substance Use Topics  . Smoking status: Current Some Day Smoker -- 4.00 packs/day    Types: Cigarettes  . Smokeless tobacco: Not on file  . Alcohol Use: No    Review of Systems ROS: Statement: All systems negative except as marked or noted in the HPI; Constitutional: Negative for fever and chills. ; ; Eyes: Negative for eye pain, redness and discharge. ; ; ENMT: Negative for ear pain, hoarseness, nasal congestion, sinus pressure and sore throat. ; ; Cardiovascular: Negative for chest pain, palpitations, diaphoresis, dyspnea and peripheral edema. ; ; Respiratory: Negative for cough, wheezing and stridor. ; ; Gastrointestinal: Negative for nausea, vomiting, diarrhea, abdominal pain, blood in stool, hematemesis, jaundice and rectal bleeding. . ; ; Genitourinary: Negative for dysuria, flank pain and hematuria. ; ; Musculoskeletal: +LLE edema. Negative for back pain and neck pain. Negative for trauma.; ; Skin: Negative for pruritus, rash, abrasions, blisters, bruising and skin lesion.; ; Neuro: Negative for headache, lightheadedness and neck stiffness. Negative for weakness, altered level of consciousness , altered mental status, extremity weakness, paresthesias, involuntary movement, seizure and  syncope.      Allergies  Review of patient's allergies indicates no known allergies.  Home Medications   Prior to Admission medications   Medication Sig Start Date End Date Taking? Authorizing Provider  Aspirin-Acetaminophen-Caffeine (GOODY HEADACHE PO) Take 1 packet by mouth every 12 (twelve) hours as needed. Patient took yesterday for cramping    Historical Provider, MD  ferrous sulfate 325 (65 FE) MG tablet Take 1 tablet (325 mg total) by mouth 3 (three) times daily with meals. 05/12/13   Janice Norrie, MD  HYDROcodone-acetaminophen (NORCO/VICODIN) 5-325 MG per tablet Take 1 tablet by mouth every 6 (six) hours as needed. 08/26/13   Florian Buff, MD  Hyprom-Naphaz-Polysorb-Zn Sulf (CLEAR EYES COMPLETE) SOLN Apply 1 drop to eye daily.    Historical Provider, MD  megestrol (MEGACE) 40 MG tablet Take 1 tablet (40 mg total) by mouth 3 (three) times daily as needed. Take three times daily til bleeding stops then once daily until return visit 08/18/13   Jonnie Kind, MD  metroNIDAZOLE (FLAGYL) 500 MG tablet Take 1 tablet (500 mg total) by mouth 3 (three) times daily. 05/12/13   Janice Norrie, MD   BP 162/89  Pulse 74  Temp(Src) 97.5 F (36.4 C) (Oral)  Resp 16  Ht 5\' 4"  (1.626 m)  Wt 210 lb (95.255 kg)  BMI 36.03 kg/m2  SpO2 100%  LMP 01/22/2014 Physical Exam 0745: Physical examination:  Nursing notes reviewed; Vital signs and O2 SAT reviewed;  Constitutional: Well developed, Well nourished, Well  hydrated, In no acute distress; Head:  Normocephalic, atraumatic; Eyes: EOMI, PERRL, No scleral icterus; ENMT: Mouth and pharynx normal, Mucous membranes moist; Neck: Supple, Full range of motion, No lymphadenopathy; Cardiovascular: Regular rate and rhythm, No murmur, rub, or gallop; Respiratory: Breath sounds clear & equal bilaterally, No rales, rhonchi, wheezes.  Speaking full sentences with ease, Normal respiratory effort/excursion; Chest: Nontender, Movement normal; Abdomen: Soft, Nontender,  Nondistended, Normal bowel sounds; Genitourinary: No CVA tenderness; Extremities: Pulses normal, No tenderness, No deformity, no rash. NT left hip/knee/ankle/foot. +1 pedal edema left foot to knee. +left calf edema with asymmetry compared to right. +small area of mild erythema to left lower medial leg just proximal to ankle, no soft tissue crepitus, no ecchymosis, no fluctuance.; Neuro: AA&Ox3, Major CN grossly intact.  Speech clear. No gross focal motor or sensory deficits in extremities. Climbs on and off stretcher easily by herself. Gait steady.; Skin: Color normal, Warm, Dry.; Psych:  Anxious.    ED Course  Procedures     EKG Interpretation None      MDM  MDM Reviewed: previous chart, nursing note and vitals Interpretation: x-ray and ultrasound     Dg Ankle Complete Left 01/27/2014   CLINICAL DATA:  Left foot and ankle pain and swelling and erythema for 24 hr without known trauma  EXAM: LEFT ANKLE COMPLETE - 3+ VIEW  COMPARISON:  DG FOOT COMPLETE*L* dated 01/27/2014  FINDINGS: There is moderate diffuse soft tissue swelling of the ankle. The ankle joint mortise is preserved. The talar dome is intact. No acute fracture or dislocation is demonstrated. No soft tissue gas collections are demonstrated. No radiopaque foreign bodies are present. There is a plantar calcaneal spur.  IMPRESSION: No acute bony abnormality is demonstrated. No soft tissue foreign bodies are demonstrated. Soft tissue swelling may reflect cellulitis in the appropriate clinical setting.   Electronically Signed   By: David  Martinique   On: 01/27/2014 08:13   US Venous Img Lower Unilateral Left 01/27/2014   CLINICAL DATA:  Left lower extremity pain and swelling and erythema  EXAM: Left LOWER EXTREMITY VENOUS DOPPLER ULTRASOUND  TECHNIQUE: Gray-scale sonography with graded compression, as well as color Doppler and duplex ultrasound were performed to evaluate the lower extremity deep venous systems from the level of the common  femoral vein and including the common femoral, femoral, profunda femoral, popliteal and calf veins including the posterior tibial, peroneal and gastrocnemius veins when visible. The superficial great saphenous vein was also interrogated. Spectral Doppler was utilized to evaluate flow at rest and with distal augmentation maneuvers in the common femoral, femoral and popliteal veins.  COMPARISON:  None.  FINDINGS: Common Femoral Vein: No evidence of thrombus. Normal compressibility, respiratory phasicity and response to augmentation.  Saphenofemoral Junction: No evidence of thrombus. Normal compressibility and flow on color Doppler imaging.  Profunda Femoral Vein: No evidence of thrombus. Normal compressibility and flow on color Doppler imaging.  Femoral Vein: No evidence of thrombus. Normal compressibility, respiratory phasicity and response to augmentation.  Popliteal Vein: No evidence of thrombus. Normal compressibility, respiratory phasicity and response to augmentation.  Calf Veins: No evidence of thrombus. Normal compressibility and flow on color Doppler imaging.  Superficial Great Saphenous Vein: No evidence of thrombus. Normal compressibility and flow on color Doppler imaging.  Venous Reflux:  None.  Other Findings:  There is edema in the soft tissues of the calf.  IMPRESSION: No evidence of superficial or deep venous thrombosis is demonstrated on today's study.   Electronically Signed  By: David  Martinique   On: 01/27/2014 08:43   Dg Foot Complete Left 01/27/2014   CLINICAL DATA:  Left foot and ankle pain and swelling with cutaneous erythema for 24 hr without known injury.  EXAM: LEFT FOOT - COMPLETE 3+ VIEW  COMPARISON:  None.  FINDINGS: Three views of the left foot reveal the bones to be adequately mineralized. There is no evidence of an acute or old fracture. No significant degenerative changes are demonstrated. There is a plantar calcaneal spur. The soft tissues demonstrate no significant swelling. No soft  tissue gas or radiopaque foreign bodies are demonstrated.  IMPRESSION: There is no acute bony abnormality of the left foot. No soft tissue gas collections or foreign bodies are demonstrated either.   Electronically Signed   By: David  [place]ique   On: 01/27/2014 08:12    1000:  Pt has had her LLE elevated for most of her ED visit. LLE edema starting to improve on re-eval. Workup reassuring. Will tx for mild cellulitis. Dx and testing d/w pt and family.  Questions answered.  Verb understanding, agreeable to d/c home with outpt f/u.    Alfonzo Feller, DO 01/29/14 2153

## 2014-01-27 NOTE — Discharge Instructions (Signed)
°Emergency Department Resource Guide °1) Find a Doctor and Pay Out of Pocket °Although you won't have to find out who is covered by your insurance plan, it is a good idea to ask around and get recommendations. You will then need to call the office and see if the doctor you have chosen will accept you as a new patient and what types of options they offer for patients who are self-pay. Some doctors offer discounts or will set up payment plans for their patients who do not have insurance, but you will need to ask so you aren't surprised when you get to your appointment. ° °2) Contact Your Local Health Department °Not all health departments have doctors that can see patients for sick visits, but many do, so it is worth a call to see if yours does. If you don't know where your local health department is, you can check in your phone book. The CDC also has a tool to help you locate your state's health department, and many state websites also have listings of all of their local health departments. ° °3) Find a Walk-in Clinic °If your illness is not likely to be very severe or complicated, you may want to try a walk in clinic. These are popping up all over the country in pharmacies, drugstores, and shopping centers. They're usually staffed by nurse practitioners or physician assistants that have been trained to treat common illnesses and complaints. They're usually fairly quick and inexpensive. However, if you have serious medical issues or chronic medical problems, these are probably not your best option. ° °No Primary Care Doctor: °- Call [facility] at  832-8000 - they can help you locate a primary care doctor that  accepts your insurance, provides certain services, etc. °- Physician Referral Service- 1-800-533-3463 ° °Chronic Pain Problems: °Organization         Address  Phone   Notes  °[facility]  ([phone]) [phone] Patients need to be referred by their primary care doctor.  ° °Medication  Assistance: °Organization         Address  Phone   Notes  °[place] [facility] [address] °[place], [place] [address] (336) 641-8030 --Must be a resident of [place] °-- Must have NO insurance coverage whatsoever (no Medicaid/ Medicare, etc.) °-- The pt. MUST have a primary care doctor that directs their care regularly and follows them in the community °  °[facility]  (866) 331-1348   °[employer]  (888) 892-1162   ° °Agencies that provide inexpensive medical care: °Organization         Address  Phone   Notes  °[facility]  (336) 832-8035   °[facility]    (336) 832-7272   °[facility] [address] °[place], [place] [address] (336) 832-4777   °[facility] [address], °[place] (336) 271-4999   °[facility]    (336) 373-0678   °[facility]    (336) 272-1050   °[facility] ° [address], [place] Phone:  (336) 832-4444, Fax:  (336) 832-4440 Hours of Operation:  9 am - 6 pm, M-F.  Also accepts Medicaid/Medicare and self-pay.  °[facility] ° [address], [place] Phone: (336) 832-3150, Fax: (336) [phone]. Hours of Operation:  8:30 am - 5:30 pm, M-F.  Also accepts Medicaid and self-pay.  °[facility] [place] [address], [place] Phone: (336) 878-6027   °[facility] [address], [place], [place] (336)723-1848, Ext. 123 Mondays & Thursdays: 7-9 AM.  First 15 patients are seen on a first come, first serve basis. °  ° °Medicaid-accepting [place] Providers: ° °Organization         Address  Phone   Notes  °[facility] [address], [place] (336) 641-2100 Also accepts self-pay patients.  °[facility] [address], [place] ° (336) [phone]   °[facility] [address], [place]  (336) 288-8857   °[facility] [address] [place] [address], [place] (336) 299-7000   °Veita Bland [address], [place]  ° (336) [phone] Only accepts [place] Access Medicaid patients after they have their name applied to their card.  ° °Self-Pay (no insurance) in [place]: ° °Organization         Address  Phone   Notes  °Sickle Cell Patients, [facility] [address], [place] (336) 832-1970   °[facility] [address], [place] (336) 832-4400   °[facility] ° [address], [place] (336) 992-4800   °[facility]/Dr. Osei-Bonsu ° [address] [place] [address], [place] or [address], [place] (336) [phone] Phone number for both [place] and [place] locations is the same.  °[facility] [address], [place] (336) 299-0000   °[facility] [address] [place] [address], [place] or [address] (336) 852-7530 °(336) 878-2260   °[facility] [address], [place] (336) 350-1642, phone; (336) 294-5005, fax Sees patients 1st and 3rd Saturday of every month.  Must not qualify for public or private insurance (i.e. Medicaid, Medicare, [place] Health Choice, Veterans' Benefits) • Household income should be no more than 200% of the poverty level •The clinic cannot treat you if you are pregnant or think you are pregnant • Sexually transmitted diseases are not treated at the clinic.  ° ° °Dental Care: °Organization         Address  Phone  Notes  °[place] [facility] [address], [place] (336) 641-6152 Accepts children up to age 21 who are enrolled in Medicaid or [place] Health Choice; pregnant women with a Medicaid card; and children who have applied for Medicaid or [place] Health Choice, but were declined, whose parents can pay a reduced fee at time of service.  °[place]  [facility] [place]  [address], [place] (336) 641-7733 Accepts children up to age 21 who are enrolled in Medicaid or [place] Health Choice; pregnant women with a Medicaid card; and children who have applied for Medicaid or  Health Choice, but were declined, whose parents can pay a reduced fee at time of service.  °[facility] ° [address], [place] (336) 641-4533 Patients are seen by appointment only. Walk-ins are not accepted. [facility] will see patients 18 years of age and older. °Monday - Tuesday (8am-5pm) °Most Wednesdays (8:30-5pm) °$30 per visit, cash only  °[facility] ° [address], [place] (336) [phone] Patients are seen by appointment only. Walk-ins are not accepted. [facility] will see patients 18 years of age and older. °One   Wednesday Evening (Monthly: Volunteer Based).  $30 per visit, cash only  °[facility]  (919) 537-3737 for adults; Children under age 4, call Graduate Pediatric Dentistry at ([phone]) [phone]. Children aged 4-14, please call ([phone]) [phone] to request a pediatric application. ° Dental services are provided in all areas of dental care including fillings, crowns and bridges, complete and partial dentures, implants, gum treatment, root canals, and extractions. Preventive care is also provided. Treatment is provided to both adults and children. °Patients are selected via a lottery and there is often a waiting list. °  °[facility] [address], °[place] ° (336) 763-8833 [email or web address] °  °[facility] [address], [place], [place] (336)723-1848, Ext. 123 Second and Fourth Thursday of each month, opens at 6:30 AM; Clinic ends at 9 AM.  Patients are seen on a first-come first-served basis, and a limited number are seen during each clinic.  ° °[facility] ° [address], [place], [place] (336) [phone]    Eligibility Requirements °You must have lived in [place], [place], or [place] counties for at least the last three months. °  You cannot be eligible for state or federal sponsored healthcare insurance, including Veterans Administration, Medicaid, or Medicare. °  You generally cannot be eligible for healthcare insurance through your employer.  °  How to apply: °Eligibility screenings are held every Tuesday and Wednesday afternoon from 1:00 pm until 4:00 pm. You do not need an appointment for the interview!  °[facility] [address], [place], [place] 336-631-2330   °[facility]  336-342-8273   °[place]  336-703-3100   °[facility]  336-570-6415   ° °Behavioral Health Resources in the Community: °Intensive Outpatient Programs °Organization         Address  Phone  Notes  °[facility] [address], [place], [place] 336-878-6098   °[facility] [address], [place], [place] 336-832-9800   °[facility] [address], [place], [place] ° 336-882-2125   °[facility] [address],  °[place], [place] 1-800-853-5163 or 336-641-4981   °Substance Abuse Resources °Organization         Address  Phone  Notes  °[facility]  336-882-2125   °[facility]  336-784-9470   °[facility]  336-285-9073   °[facility]  336-845-3988   °Residential & Outpatient Substance Abuse Program  1-800-659-3381   °Psychological Services °Organization         Address  Phone  Notes  ° Health  336- 832-9600   °[facility]  336- 378-7881   °[facility] [address], [place] 1-800-853-5163 or 336-641-4981   ° °Mobile Crisis Teams °Organization         Address  Phone  Notes  °[facility], Mobile Crisis Care Unit  1-877-626-1772   °Assertive °[facility] ° [address]  [place], [place] 336-834-9664   °Sharon DeEsch [address] °[place] 336-554-5454   ° °Self-Help/Support Groups °Organization         Address  Phone             Notes  °[facility] [place] - variety of support groups  336- 373-1402 Call for more information  °Narcotics Anonymous (NA), [facility] [address], °[place]  2 meetings at this location  ° °  Residential Treatment Programs Organization         Address  Phone  Notes  [facility] [address],    [place]  1-(707)611-7499   [facility]  [address] [place] [address], [place], [place]   [place], [place] (631) 778-3902 Admissions: 8am-3pm M-F  [facility] [address]    [place], [place] 621-308-6578   [facility] [address] [place], [place], [place]   [facility] [address],  [place], [place]   [facility] Intensive Outpatient [address], [place], [place]   [facility] [address]  [place], [place] 1-949-490-7543 or 470-079-1905   [facility] [address] [place], [place] Accepts Medicaid  [facility] [address],  [place] 1-312-158-1981 Substance Abuse/Addiction Treatment   [facility]         Address  Phone  Notes  [facility]  805-573-0999   Domenic Schwab, PhD [address] [place], [place]   216-837-9407 or 4700766687   [facility] [place], [place] [phone]   [facility] [address] [place] [address], [place], [place] 934-694-2786 Insurance/Medicaid/sponsorship through [facility] [address] [place]                                    [place], [place] [phone] [place] [address], [place] (228) [phone]    Dr. Adele Schilder  (438)[phone]   [facility] Dept. 1) [address] [place] 2) [place] 3)  [address] [address], [place] [phone] [phone]  [phone]   [place] [phone] or [phone] (After Hours)       Take the prescription as directed. Elevate your legs as much as possible. Call your regular medical doctor today to schedule a follow up appointment within the next 2 days.  Return to the Emergency Department immediately sooner if worsening.

## 2014-01-27 NOTE — ED Notes (Signed)
Swelling to left lower leg first noticed yesterday.

## 2014-03-23 ENCOUNTER — Ambulatory Visit: Payer: BC Managed Care – PPO | Admitting: Family Medicine

## 2014-04-12 ENCOUNTER — Other Ambulatory Visit (HOSPITAL_COMMUNITY): Payer: Self-pay | Admitting: Family Medicine

## 2014-04-12 DIAGNOSIS — Z139 Encounter for screening, unspecified: Secondary | ICD-10-CM

## 2014-04-16 ENCOUNTER — Ambulatory Visit (HOSPITAL_COMMUNITY): Payer: BC Managed Care – PPO

## 2015-01-07 ENCOUNTER — Emergency Department (HOSPITAL_COMMUNITY)
Admission: EM | Admit: 2015-01-07 | Discharge: 2015-01-07 | Disposition: A | Discharge status: Home / Self Care | Payer: Self-pay | Attending: Emergency Medicine | Admitting: Emergency Medicine

## 2015-01-07 ENCOUNTER — Encounter (HOSPITAL_COMMUNITY): Payer: Self-pay | Admitting: Emergency Medicine

## 2015-01-07 ENCOUNTER — Emergency Department (HOSPITAL_COMMUNITY): Payer: Self-pay

## 2015-01-07 DIAGNOSIS — Z79899 Other long term (current) drug therapy: Secondary | ICD-10-CM | POA: Insufficient documentation

## 2015-01-07 DIAGNOSIS — R079 Chest pain, unspecified: Secondary | ICD-10-CM | POA: Insufficient documentation

## 2015-01-07 DIAGNOSIS — D573 Sickle-cell trait: Secondary | ICD-10-CM | POA: Insufficient documentation

## 2015-01-07 DIAGNOSIS — Z720 Tobacco use: Secondary | ICD-10-CM | POA: Insufficient documentation

## 2015-01-07 DIAGNOSIS — R200 Anesthesia of skin: Secondary | ICD-10-CM | POA: Insufficient documentation

## 2015-01-07 DIAGNOSIS — I10 Essential (primary) hypertension: Secondary | ICD-10-CM | POA: Insufficient documentation

## 2015-01-07 DIAGNOSIS — E119 Type 2 diabetes mellitus without complications: Secondary | ICD-10-CM | POA: Insufficient documentation

## 2015-01-07 HISTORY — DX: Type 2 diabetes mellitus without complications: E11.9

## 2015-01-07 LAB — COMPREHENSIVE METABOLIC PANEL
ALT: 10 U/L (ref 0–35)
AST: 15 U/L (ref 0–37)
Albumin: 4 g/dL (ref 3.5–5.2)
Alkaline Phosphatase: 51 U/L (ref 39–117)
Anion gap: 8 (ref 5–15)
BUN: 17 mg/dL (ref 6–23)
CO2: 26 mmol/L (ref 19–32)
Calcium: 9.2 mg/dL (ref 8.4–10.5)
Chloride: 102 mmol/L (ref 96–112)
Creatinine, Ser: 0.66 mg/dL (ref 0.50–1.10)
GFR calc Af Amer: >90 mL/min (ref >90)
GLUCOSE: 100 mg/dL — AB (ref 70–99)
Potassium: 3.3 mmol/L — ABNORMAL LOW (ref 3.5–5.1)
Sodium: 136 mmol/L (ref 135–145)
TOTAL PROTEIN: 7.7 g/dL (ref 6.0–8.3)
Total Bilirubin: 0.3 mg/dL (ref 0.3–1.2)

## 2015-01-07 LAB — CBC WITH DIFFERENTIAL/PLATELET
BASOS PCT: 1 % (ref 0–1)
Basophils Absolute: 0.1 10*3/uL (ref 0.0–0.1)
EOS ABS: 0.3 10*3/uL (ref 0.0–0.7)
Eosinophils Relative: 4 % (ref 0–5)
HCT: 31.7 % — ABNORMAL LOW (ref 36.0–46.0)
HEMOGLOBIN: 9.9 g/dL — AB (ref 12.0–15.0)
Lymphocytes Relative: 31 % (ref 12–46)
Lymphs Abs: 2.4 10*3/uL (ref 0.7–4.0)
MCH: 20.6 pg — ABNORMAL LOW (ref 26.0–34.0)
MCHC: 31.2 g/dL (ref 30.0–36.0)
MCV: 65.9 fL — AB (ref 78.0–100.0)
MONO ABS: 1.2 10*3/uL — AB (ref 0.1–1.0)
MONOS PCT: 15 % — AB (ref 3–12)
NEUTROS ABS: 3.8 10*3/uL (ref 1.7–7.7)
Neutrophils Relative %: 49 % (ref 43–77)
PLATELETS: 343 10*3/uL (ref 150–400)
RBC: 4.81 MIL/uL (ref 3.87–5.11)
RDW: 21.4 % — ABNORMAL HIGH (ref 11.5–15.5)
WBC: 7.7 10*3/uL (ref 4.0–10.5)

## 2015-01-07 LAB — TROPONIN I

## 2015-01-07 MED ORDER — TRAMADOL HCL 50 MG PO TABS: 50.0000 mg | ORAL_TABLET | Freq: Four times a day (QID) | ORAL | Status: DC | PRN

## 2015-01-07 NOTE — Discharge Instructions (Signed)
Follow up with a family md or [facility] in 1-2 weeks

## 2015-01-07 NOTE — ED Notes (Signed)
Patient reports chest pain x 2 months worsened today. States sharp pain in left chest that radiates across chest and into left arm. Also reports numbness and tingling to left arm and shortness of breath.

## 2015-01-07 NOTE — ED Provider Notes (Signed)
CSN: [id number]     Arrival date & time 01/07/15  1921 History  This chart was scribed for Marie Ferguson, MD by Irene Pap, ED Scribe. This patient was seen in room APA14/APA14 and patient care was started at 10:33 PM.   Chief Complaint  Patient presents with  . Chest Pain   Patient is a 44 y.o. female presenting with chest pain.  Chest Pain Pain location:  L chest Pain quality: stabbing   Pain radiates to:  Does not radiate Pain radiates to the back: no   Pain severity:  Moderate Onset quality:  Gradual Duration:  1 day Timing:  Constant Progression:  Waxing and waning Chronicity:  New Associated symptoms: numbness   Associated symptoms: no abdominal pain, no back pain, no cough, no fatigue, no headache and no nausea     HPI Comments: [name] is a 44 y.o. female who presents to the Emergency Department complaining of stabbing chest pain onset earlier today. She reports that she has had this problem in the past, but has worsened today. She reports the pain stays centralized under the left breast. She states that each episode lasts about 5-10 minutes. She states that she feels numbness in her left index finger. She denies nausea. [facility].    Past Medical History  Diagnosis Date  . Hypertension   . Anemia   . Sickle cell trait   . Diabetes mellitus without complication    Past Surgical History  Procedure Laterality Date  . Tubal ligation     Family History  Problem Relation Age of Onset  . Diabetes Father    History  Substance Use Topics  . Smoking status: Current Some Day Smoker -- 4.00 packs/day    Types: Cigarettes  . Smokeless tobacco: Not on file  . Alcohol Use: No   OB History    No data available     Review of Systems  Constitutional: Negative for appetite change and fatigue.  HENT: Negative for congestion, ear discharge and sinus pressure.   Eyes: Negative for discharge.   Respiratory: Negative for cough.   Cardiovascular: Negative for chest pain.  Gastrointestinal: Negative for nausea, abdominal pain and diarrhea.  Genitourinary: Negative for frequency and hematuria.  Musculoskeletal: Negative for back pain.  Skin: Negative for rash.  Neurological: Positive for numbness. Negative for seizures and headaches.  Psychiatric/Behavioral: Negative for hallucinations.   Allergies  Review of patient's allergies indicates no known allergies.  Home Medications   Prior to Admission medications   Medication Sig Start Date End Date Taking? Authorizing Provider  ferrous sulfate 325 (65 FE) MG tablet Take 325 mg by mouth daily with breakfast. 05/12/13  Yes Rolland Porter, MD  lisinopril-hydrochlorothiazide (PRINZIDE,ZESTORETIC) 10-12.5 MG per tablet Take 1 tablet by mouth daily.   Yes Historical Provider, MD  doxycycline (VIBRA-TABS) 100 MG tablet Take 1 tablet (100 mg total) by mouth 2 (two) times daily. Patient not taking: Reported on 01/07/2015 01/27/14   Francine Graven, DO   BP 133/92 mmHg  Pulse 70  Temp(Src) 98.7 F (37.1 C) (Oral)  Resp 20  Ht 5\' 3"  (1.6 m)  Wt 210 lb (95.255 kg)  BMI 37.21 kg/m2  SpO2 100%  LMP 12/27/2014   Physical Exam  Constitutional: She is oriented to person, place, and time. She appears well-developed.  HENT:  Head: Normocephalic.  Eyes: Conjunctivae and EOM are normal. No scleral icterus.  Neck: Neck  supple. No thyromegaly present.  Cardiovascular: Normal rate and regular rhythm.  Exam reveals no gallop and no friction rub.   No murmur heard. Pulmonary/Chest: No stridor. She has no wheezes. She has no rales. She exhibits no tenderness.  Abdominal: She exhibits no distension. There is no tenderness. There is no rebound.  Musculoskeletal: Normal range of motion. She exhibits no edema.  Lymphadenopathy:    She has no cervical adenopathy.  Neurological: She is oriented to person, place, and time. She exhibits normal muscle tone.  Coordination normal.  Skin: No rash noted. No erythema.  Psychiatric: She has a normal mood and affect. Her behavior is normal.    ED Course  Procedures (including critical care time) DIAGNOSTIC STUDIES: Oxygen Saturation is 100% on room air, normal by my interpretation.    COORDINATION OF CARE: 10:37 PM-Discussed treatment plan which includes labs and x-ray with pt at bedside and pt agreed to plan.   Labs Review Labs Reviewed  CBC WITH DIFFERENTIAL/PLATELET - Abnormal; Notable for the following:    Hemoglobin 9.9 (*)    HCT 31.7 (*)    MCV 65.9 (*)    MCH 20.6 (*)    RDW 21.4 (*)    Monocytes Relative 15 (*)    Monocytes Absolute 1.2 (*)    All other components within normal limits  COMPREHENSIVE METABOLIC PANEL - Abnormal; Notable for the following:    Potassium 3.3 (*)    Glucose, Bld 100 (*)    All other components within normal limits  TROPONIN I    Imaging Review Dg Chest 2 View  01/07/2015   CLINICAL DATA:  Centralized chest pain radiating to the upper abdomen for 1 month.  EXAM: CHEST  2 VIEW  COMPARISON:  None.  FINDINGS: The heart size is at upper limits of normal with central vascular prominence. Both lungs are clear but volumes are low with crowding of the bronchovascular markings. The visualized skeletal structures are unremarkable.  IMPRESSION: Borderline cardiomegaly without focal acute finding.   Electronically Signed   By: Conchita Paris M.D.   On: 01/07/2015 20:14     EKG Interpretation   Date/Time:  Friday January 07 2015 19:30:58 EDT Ventricular Rate:  68 PR Interval:  158 QRS Duration: 90 QT Interval:  392 QTC Calculation: 416 R Axis:   65 Text Interpretation:  Normal sinus rhythm Normal ECG No significant change  was found Confirmed by Wyvonnia Dusky  MD, STEPHEN [phone]) on 01/07/2015 7:53:52  PM Also confirmed by Shauni Henner  MD, Broadus John [phone])  on 01/07/2015 10:32:18 PM      MDM   Final diagnoses:  None   Non cardiac chest pain with htn.  Pt given  ultram and will follow up with pcp or cards   The chart was scribed for me under my direct supervision.  I personally performed the history, physical, and medical decision making and all procedures in the evaluation of this patient.Marie Ferguson, MD 01/07/15 [phone]

## 2015-05-16 ENCOUNTER — Other Ambulatory Visit (HOSPITAL_COMMUNITY): Payer: Self-pay | Admitting: Family Medicine

## 2015-05-16 DIAGNOSIS — Z139 Encounter for screening, unspecified: Secondary | ICD-10-CM

## 2015-05-25 ENCOUNTER — Ambulatory Visit (HOSPITAL_COMMUNITY): Payer: Self-pay

## 2015-06-16 ENCOUNTER — Other Ambulatory Visit: Payer: Self-pay | Admitting: Obstetrics and Gynecology

## 2015-06-16 DIAGNOSIS — R928 Other abnormal and inconclusive findings on diagnostic imaging of breast: Secondary | ICD-10-CM

## 2015-06-17 ENCOUNTER — Other Ambulatory Visit: Payer: Self-pay | Admitting: Obstetrics and Gynecology

## 2015-06-17 DIAGNOSIS — R928 Other abnormal and inconclusive findings on diagnostic imaging of breast: Secondary | ICD-10-CM

## 2015-06-23 ENCOUNTER — Other Ambulatory Visit (HOSPITAL_COMMUNITY)
Admission: RE | Admit: 2015-06-23 | Discharge: 2015-06-23 | Disposition: A | Discharge status: Home / Self Care | Payer: BLUE CROSS/BLUE SHIELD | Source: Ambulatory Visit | Attending: Obstetrics and Gynecology | Admitting: Obstetrics and Gynecology

## 2015-06-23 ENCOUNTER — Ambulatory Visit (INDEPENDENT_AMBULATORY_CARE_PROVIDER_SITE_OTHER): Payer: BLUE CROSS/BLUE SHIELD | Admitting: Obstetrics and Gynecology

## 2015-06-23 ENCOUNTER — Encounter: Payer: Self-pay | Admitting: Obstetrics and Gynecology

## 2015-06-23 VITALS — BP 140/80 | Ht 64.0 in | Wt 210.5 lb

## 2015-06-23 DIAGNOSIS — D259 Leiomyoma of uterus, unspecified: Secondary | ICD-10-CM

## 2015-06-23 DIAGNOSIS — Z1151 Encounter for screening for human papillomavirus (HPV): Secondary | ICD-10-CM | POA: Diagnosis not present

## 2015-06-23 DIAGNOSIS — Z113 Encounter for screening for infections with a predominantly sexual mode of transmission: Secondary | ICD-10-CM | POA: Insufficient documentation

## 2015-06-23 DIAGNOSIS — Z124 Encounter for screening for malignant neoplasm of cervix: Secondary | ICD-10-CM

## 2015-06-23 DIAGNOSIS — Z01419 Encounter for gynecological examination (general) (routine) without abnormal findings: Secondary | ICD-10-CM | POA: Insufficient documentation

## 2015-06-23 MED ORDER — LEUPROLIDE ACETATE (3 MONTH) 11.25 MG IM KIT: 11.2500 mg | PACK | INTRAMUSCULAR | Status: DC

## 2015-06-23 NOTE — Progress Notes (Signed)
Patient ID: [name], female   DOB: April 09, 1971, 44 y.o.   MRN: [id number] Pt here today for follow up on fibroids. Pt states that she has been having heavy bleeding for about 4-5 years. Pt states that she has pain also.

## 2015-06-23 NOTE — Progress Notes (Signed)
   [facility] Visit  Patient name: [name] MRN [id number]  Date of birth: [birth year]  CC & HPI:  [name] is a 44 y.o. female presenting today for follow up on uterine fibroids that began 4 years ago. She states she has been trying to stave off surgery for as long as possible. Patient reports daily vaginal bleeding and states that in a day, she goes through several tampons and pads.  Patient states she has been taking iron supplements, but she states still feels intermittently light-headed and near-syncopal. [facility] [place] that has suggested that she will need surgery rather than ablative therapy for the fibroids  ROS:  A complete review of systems was obtained and all systems are negative except as noted in the HPI and PMH.    Pertinent History Reviewed:   Reviewed: Significant for tubal ligation, fibroids Medical         Past Medical History  Diagnosis Date  . Hypertension   . Anemia   . Sickle cell trait ([place])   . Diabetes mellitus without complication ([place])   . Fibroids                               Surgical Hx:    Past Surgical History  Procedure Laterality Date  . Tubal ligation     Medications: Reviewed & Updated - see associated section                       Current outpatient prescriptions:  .  Aspirin-Acetaminophen-Caffeine (GOODYS EXTRA STRENGTH) 520-260-32.5 MG PACK, Take 1 packet by mouth as needed., Disp: , Rfl:  .  ferrous sulfate 325 (65 FE) MG tablet, Take 325 mg by mouth daily with breakfast., Disp: , Rfl:    Social History: Reviewed -  reports that she has been smoking Cigarettes.  She has a 28 pack-year smoking history. She has never used smokeless tobacco.  Objective Findings:  Vitals: Blood pressure 140/80, height 5\' 4"  (1.626 m), weight 210 lb 8 oz (95.482 kg), last menstrual period 06/16/2015.  Physical Examination: General appearance - alert, well appearing, and in no distress, oriented to  person, place, and time and overweight Pelvic - normal external genitalia, vulva, vagina, cervix, uterus and adnexa, VULVA: normal appearing vulva with no masses, tenderness or lesions,  VAGINA: normal appearing vagina with normal color and discharge, no lesions,  CERVIX: normal appearing cervix without discharge or lesions, nonpurulent  UTERUS: 10-12 wk size fibroid, good support ADNEXA: normal adnexa in size, nontender and no masses   Assessment & Plan:   A:  1. Uterine fibroids. 2. GC/CHL and pap smear collected.   P:  1. Will perform endometrial bx in 1 week. 2. Subsequent Lupron treatment ordered, to be injected if bx results are negative.  3 cbc at recheck 3. Will decide on hysterectomyin December, surgery 3 months after Lupron treatment.    By signing my name below, I, Stephania Fragmin, attest that this documentation has been prepared under the direction and in the presence of Jonnie Kind, MD. Electronically Signed: Stephania Fragmin, ED Scribe. 06/23/2015. 10:01 AM.   I personally performed the services described in this documentation, which was SCRIBED in my presence. The recorded information has been reviewed and considered accurate. It has been edited as necessary during review. Jonnie Kind, MD

## 2015-06-24 ENCOUNTER — Other Ambulatory Visit: Payer: Self-pay

## 2015-06-24 ENCOUNTER — Inpatient Hospital Stay: Admission: RE | Admit: 2015-06-24 | Payer: Self-pay | Source: Ambulatory Visit

## 2015-06-24 LAB — CYTOLOGY - PAP

## 2015-06-29 ENCOUNTER — Other Ambulatory Visit (HOSPITAL_COMMUNITY): Payer: Self-pay | Admitting: Obstetrics and Gynecology

## 2015-06-29 DIAGNOSIS — R928 Other abnormal and inconclusive findings on diagnostic imaging of breast: Secondary | ICD-10-CM

## 2015-07-01 ENCOUNTER — Ambulatory Visit (INDEPENDENT_AMBULATORY_CARE_PROVIDER_SITE_OTHER): Payer: BLUE CROSS/BLUE SHIELD | Admitting: Obstetrics and Gynecology

## 2015-07-01 ENCOUNTER — Encounter: Payer: Self-pay | Admitting: Obstetrics and Gynecology

## 2015-07-01 ENCOUNTER — Other Ambulatory Visit: Payer: Self-pay | Admitting: Obstetrics and Gynecology

## 2015-07-01 VITALS — BP 150/96 | Ht 63.0 in | Wt 210.0 lb

## 2015-07-01 DIAGNOSIS — Z01818 Encounter for other preprocedural examination: Secondary | ICD-10-CM

## 2015-07-01 DIAGNOSIS — D259 Leiomyoma of uterus, unspecified: Secondary | ICD-10-CM | POA: Diagnosis not present

## 2015-07-01 DIAGNOSIS — Z3200 Encounter for pregnancy test, result unknown: Secondary | ICD-10-CM

## 2015-07-01 DIAGNOSIS — N938 Other specified abnormal uterine and vaginal bleeding: Secondary | ICD-10-CM | POA: Diagnosis not present

## 2015-07-01 DIAGNOSIS — N946 Dysmenorrhea, unspecified: Secondary | ICD-10-CM

## 2015-07-01 DIAGNOSIS — D251 Intramural leiomyoma of uterus: Secondary | ICD-10-CM

## 2015-07-01 LAB — POCT URINE PREGNANCY: Preg Test, Ur: NEGATIVE

## 2015-07-01 NOTE — Progress Notes (Signed)
Patient ID: [name], female   DOB: 05-24-1971, 44 y.o.   MRN: [id number] Pt here today for endometrial biopsy.

## 2015-07-01 NOTE — Progress Notes (Signed)
Patient ID: [name], female   DOB: 1971-01-20, 44 y.o.   MRN: [id number]  She is here for endometrial biopsy due to vaginal bleeding from her uterine fibroids  Endometrial Biopsy: Patient given informed consent, signed copy in the chart, time out was performed. Time out taken. The patient was placed in the lithotomy position and the cervix brought into view with sterile speculum.  Portio of cervix cleansed x 2 with betadine swabs.  A tenaculum was placed in the anterior lip of the cervix. The uterus was sounded for depth of 9 cm Milex uterine Explora 3 mm was introduced to into the uterus, suction created,  and an endometrial sample was obtained. All equipment was removed and accounted for.   The patient tolerated the procedure well.    Patient given post procedure instructions.  Followup: in 1 week for results and Lupron shot  By signing my name below, I, Erling Conte, attest that this documentation has been prepared under the direction and in the presence of Jonnie Kind, MD. Electronically Signed: Erling Conte, ED Scribe. 07/01/2015. 12:46 PM.  I personally performed the services described in this documentation, which was SCRIBED in my presence. The recorded information has been reviewed and considered accurate. It has been edited as necessary during review. Jonnie Kind, MD

## 2015-07-04 ENCOUNTER — Telehealth: Payer: Self-pay | Admitting: Obstetrics and Gynecology

## 2015-07-04 NOTE — Telephone Encounter (Signed)
[name] called requesting the name of the injection medication that pt will be getting for insurance purposes. Explained to [name], he was not listed on the pt HIPAA and was unable to release any information to him. He verbalized understanding and stated he would get the pt to call our office back.

## 2015-07-04 NOTE — Telephone Encounter (Signed)
Pt states that we have to call the insurance company and discuss the injection with them. Pt states that the normal pharmacies can't fill the Rx for the Lupron. Pt states that her husband called and the insurance company told him that we had to call them and order the medication. [phone].

## 2015-07-04 NOTE — Telephone Encounter (Signed)
Apothecary, i suspect

## 2015-07-05 MED ORDER — LEUPROLIDE ACETATE (3 MONTH) 11.25 MG IM KIT: 11.2500 mg | PACK | INTRAMUSCULAR | Status: DC

## 2015-07-05 NOTE — Telephone Encounter (Signed)
Pt aware that we have sent the Rx to [facility] and they will be able to order her medication. Pt is also aware that her insurance may require a prior authorization for this medication and that we would get that from the pharmacy once they received the Rx. Pt verbalized understanding.

## 2015-07-08 ENCOUNTER — Telehealth: Payer: Self-pay | Admitting: Obstetrics and Gynecology

## 2015-07-08 ENCOUNTER — Ambulatory Visit: Payer: BLUE CROSS/BLUE SHIELD | Admitting: Obstetrics and Gynecology

## 2015-07-08 MED ORDER — LEUPROLIDE ACETATE (3 MONTH) 11.25 MG IM KIT: 11.2500 mg | PACK | INTRAMUSCULAR | Status: DC

## 2015-07-08 NOTE — Telephone Encounter (Signed)
Pt gave a different number to a new pharmacy to try to get the Lupron injection covered. Phone [phone], fax [phone]  I advised the pt that this injection can be pretty costly even with insurance. I advised the pt that I would send the pharmacy the Rx and see what they say.

## 2015-07-12 ENCOUNTER — Other Ambulatory Visit: Payer: Self-pay

## 2015-07-12 ENCOUNTER — Ambulatory Visit (HOSPITAL_COMMUNITY)
Admission: RE | Admit: 2015-07-12 | Discharge: 2015-07-12 | Disposition: A | Discharge status: Home / Self Care | Payer: BLUE CROSS/BLUE SHIELD | Source: Ambulatory Visit | Attending: Obstetrics and Gynecology | Admitting: Obstetrics and Gynecology

## 2015-07-12 DIAGNOSIS — R928 Other abnormal and inconclusive findings on diagnostic imaging of breast: Secondary | ICD-10-CM | POA: Diagnosis present

## 2015-07-12 DIAGNOSIS — N6489 Other specified disorders of breast: Secondary | ICD-10-CM | POA: Insufficient documentation

## 2015-07-12 DIAGNOSIS — N63 Unspecified lump in breast: Secondary | ICD-10-CM | POA: Diagnosis not present

## 2015-07-15 ENCOUNTER — Ambulatory Visit: Payer: BLUE CROSS/BLUE SHIELD | Admitting: Obstetrics and Gynecology

## 2015-07-16 ENCOUNTER — Other Ambulatory Visit: Payer: Self-pay | Admitting: Obstetrics and Gynecology

## 2015-07-16 MED ORDER — MEDROXYPROGESTERONE ACETATE 2.5 MG PO TABS: 2.5000 mg | ORAL_TABLET | Freq: Every day | ORAL | Status: DC

## 2015-07-16 MED ORDER — MEDROXYPROGESTERONE ACETATE 10 MG PO TABS: 10.0000 mg | ORAL_TABLET | Freq: Every day | ORAL | Status: DC

## 2015-07-18 ENCOUNTER — Telehealth: Payer: Self-pay | Admitting: *Deleted

## 2015-07-18 MED ORDER — MEDROXYPROGESTERONE ACETATE 2.5 MG PO TABS: 2.5000 mg | ORAL_TABLET | Freq: Every day | ORAL | Status: DC

## 2015-07-18 MED ORDER — MEDROXYPROGESTERONE ACETATE 10 MG PO TABS: 10.0000 mg | ORAL_TABLET | Freq: Every day | ORAL | Status: DC

## 2015-07-18 NOTE — Telephone Encounter (Signed)
Per Dr. Glo Herring explain things and discuss why Lupron is taking so long, see if pt wants to proceed with surgery. LMOM x 1 AMT

## 2015-07-18 NOTE — Telephone Encounter (Signed)
Pt aware of change in plans and aware that Rx's will be at her pharmacy. Pt verbalized understanding. Pt is aware how to take medications as well.

## 2015-07-18 NOTE — Progress Notes (Signed)
Pt aware of changes and aware how to take medications.

## 2015-07-21 ENCOUNTER — Telehealth: Payer: Self-pay | Admitting: Obstetrics and Gynecology

## 2015-07-21 NOTE — Telephone Encounter (Signed)
Pt called wanting to know how she was supposed to take the medication that Dr.Ferguson prescribed her. I advised the pt of how Dr. Glo Herring wanted her to take the medication and she verbalized understanding.

## 2015-07-25 ENCOUNTER — Telehealth: Payer: Self-pay | Admitting: Obstetrics and Gynecology

## 2015-07-26 NOTE — Telephone Encounter (Signed)
Pt questions anwered earlier by Rn.

## 2015-08-08 ENCOUNTER — Telehealth: Payer: Self-pay | Admitting: Obstetrics and Gynecology

## 2015-08-08 NOTE — Telephone Encounter (Signed)
Pt states that she is bleeding bad. Pt states that she bleeds through her clothes. Pt states that she has been taking the 2.5mg  for about 4-5 days and it has made things worse. Pt is going to bring by FMLA papers to be filled out. Pt states that she needs a note for work stating that she can not pick up heavy things. Pt advised that I would send this message to Dr. Glo Herring so he could give her a note and that i could switch the call up front and get her scheduled for her pre op appointment. Pt verbalized understanding and appointment was made.

## 2015-08-08 NOTE — Telephone Encounter (Addendum)
Will d/c provera, and add megace 40 mg tid Telephone call to pt who needs to be seen . Left message on pt phone

## 2015-08-09 ENCOUNTER — Other Ambulatory Visit: Payer: Self-pay | Admitting: Obstetrics and Gynecology

## 2015-08-09 DIAGNOSIS — N920 Excessive and frequent menstruation with regular cycle: Secondary | ICD-10-CM

## 2015-08-09 DIAGNOSIS — D250 Submucous leiomyoma of uterus: Secondary | ICD-10-CM

## 2015-08-09 MED ORDER — MEGESTROL ACETATE 40 MG PO TABS: 40.0000 mg | ORAL_TABLET | Freq: Three times a day (TID) | ORAL | Status: DC

## 2015-08-09 NOTE — Telephone Encounter (Signed)
Prescription for Megace called in patient is aware and will pick up

## 2015-08-10 DIAGNOSIS — Z029 Encounter for administrative examinations, unspecified: Secondary | ICD-10-CM

## 2015-08-17 ENCOUNTER — Ambulatory Visit (INDEPENDENT_AMBULATORY_CARE_PROVIDER_SITE_OTHER): Payer: BLUE CROSS/BLUE SHIELD | Admitting: Obstetrics and Gynecology

## 2015-08-17 ENCOUNTER — Encounter: Payer: Self-pay | Admitting: Obstetrics and Gynecology

## 2015-08-17 VITALS — BP 128/82 | Ht 63.0 in | Wt 213.0 lb

## 2015-08-17 DIAGNOSIS — D500 Iron deficiency anemia secondary to blood loss (chronic): Secondary | ICD-10-CM

## 2015-08-17 DIAGNOSIS — N938 Other specified abnormal uterine and vaginal bleeding: Secondary | ICD-10-CM

## 2015-08-17 DIAGNOSIS — Z01818 Encounter for other preprocedural examination: Secondary | ICD-10-CM

## 2015-08-17 DIAGNOSIS — D259 Leiomyoma of uterus, unspecified: Secondary | ICD-10-CM

## 2015-08-17 LAB — POCT HEMOGLOBIN: Hemoglobin: 6.9 g/dL — AB (ref 12.2–16.2)

## 2015-08-17 NOTE — Progress Notes (Signed)
Patient ID: [name], female   DOB: [birth year], 44 y.o.   MRN: [id number]  Preoperative History and Physical  [name] is a 44 y.o. No obstetric history on file. here for surgical management of uterine fibroids   No significant preoperative concerns. She has been taking Provera. She has been feeling fatigued and has concerns for anemia. She is NOT taking iron as she dislikes taking it (?). Proposed surgery: Abdominal hysterectomy and b/l salpingectomy   Past Medical History  Diagnosis Date  . Hypertension   . Anemia   . Sickle cell trait ([place])   . Diabetes mellitus without complication ([place])   . Fibroids    Past Surgical History  Procedure Laterality Date  . Tubal ligation     OB History  No data available  Patient denies any other pertinent gynecologic issues.   Current Outpatient Prescriptions on File Prior to Visit  Medication Sig Dispense Refill  . medroxyPROGESTERone (PROVERA) 2.5 MG tablet Take 1 tablet (2.5 mg total) by mouth daily. beginning5 days after completing provera 10 mg Rx. 30 tablet 1  . megestrol (MEGACE) 40 MG tablet Take 1 tablet (40 mg total) by mouth 3 (three) times daily. (Patient not taking: Reported on 08/17/2015) 45 tablet 2   No current facility-administered medications on file prior to visit.   No Known Allergies  Social History:   reports that she has been smoking Cigarettes.  She has a 28 pack-year smoking history. She has never used smokeless tobacco. She reports that she does not drink alcohol or use illicit drugs.  Family History  Problem Relation Age of Onset  . Diabetes Father     Review of Systems: Noncontributory  PHYSICAL EXAM: Blood pressure 128/82, height 5\' 3"  (1.6 m), weight 213 lb (96.616 kg). General appearance - alert, well appearing, and in no distress Chest - clear to auscultation, no wheezes, rales or rhonchi, symmetric air entry Heart - normal rate and regular rhythm Abdomen - soft, nontender, nondistended, no  masses or organomegaly Pelvic - good support; anterior uterus about 12-14 weeks size Extremities - peripheral pulses normal, no pedal edema, no clubbing or cyanosis  Labs: Results for orders placed or performed in visit on 08/17/15 (from the past 336 hour(s))  POCT hemoglobin   Collection Time: 08/17/15  4:17 PM  Result Value Ref Range   Hemoglobin 6.9 (A) 12.2 - 16.2 g/dL    Imaging Studies: No results found.  Assessment: Patient Active Problem List   Diagnosis Date Noted  . Excessive or frequent menstruation 07/20/2013  . Dysmenorrhea 07/20/2013  . Fibroid, uterine 06/17/2013  . OBESITY 01/07/2008  . 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 Schedule for IV Iron to be scheduled by [facility] ASAP 2. Resubmit application for Lupron 3. Will f/u in 3 weeks, earlier if Lupron approved 4. If supression effective, pt hopes to avoid surgery til January.  .mec 08/17/2015 4:16 PM   By signing my name below, I, Erling Conte, attest that this documentation has been prepared under the direction and in the presence of Jonnie Kind, MD. Electronically Signed: Erling Conte, ED Scribe. 08/17/2015. 4:16 PM.  I personally performed the services described in this documentation, which was SCRIBED in my presence. The recorded information has been reviewed and considered accurate. It has been edited as necessary during review. Jonnie Kind, MD

## 2015-08-17 NOTE — Progress Notes (Signed)
Patient ID: [name], female   DOB: [birth year], 44 y.o.   MRN: [id number] Pre op exam

## 2015-08-18 LAB — GC/CHLAMYDIA PROBE AMP
Chlamydia trachomatis, NAA: NEGATIVE
NEISSERIA GONORRHOEAE BY PCR: NEGATIVE

## 2015-08-22 ENCOUNTER — Telehealth: Payer: Self-pay | Admitting: Obstetrics and Gynecology

## 2015-08-22 ENCOUNTER — Other Ambulatory Visit: Payer: Self-pay | Admitting: Obstetrics and Gynecology

## 2015-08-22 MED ORDER — FERUMOXYTOL INJECTION 510 MG/17 ML: 510.0000 mg | Freq: Once | INTRAVENOUS | Status: DC

## 2015-08-24 ENCOUNTER — Other Ambulatory Visit: Payer: Self-pay | Admitting: Obstetrics and Gynecology

## 2015-08-24 NOTE — Telephone Encounter (Signed)
Will arrange Feraheme infusion and address prior auth for Lupron tomorrow.

## 2015-08-31 ENCOUNTER — Telehealth: Payer: Self-pay | Admitting: *Deleted

## 2015-08-31 NOTE — Telephone Encounter (Signed)
Pt states she still has received a call back in regards to Fe injection or Lupron PA. Is needing to know when she will be having surgery due to employee making staffing arrangements. Please advise.

## 2015-09-01 NOTE — Telephone Encounter (Signed)
Pt states that she needs to know something about her surgery before next week, and she needs to know when iron infusions were scheduled. Pt states that she is still waiting on approval for Lupron injection.   I advised the pt that I would need to discuss these things with Dr. Glo Herring and find out exactly what all the plans are and then call her back.

## 2015-09-06 ENCOUNTER — Telehealth: Payer: Self-pay | Admitting: Obstetrics and Gynecology

## 2015-09-06 ENCOUNTER — Other Ambulatory Visit: Payer: Self-pay | Admitting: Obstetrics and Gynecology

## 2015-09-06 MED ORDER — LEUPROLIDE ACETATE 3.75 MG IM KIT: 3.7500 mg | PACK | Freq: Once | INTRAMUSCULAR | Status: DC

## 2015-09-06 NOTE — Telephone Encounter (Signed)
Pt to receive fera heme tomorrow 12/22 at [facility].

## 2015-09-06 NOTE — Telephone Encounter (Signed)
Pt was given Rx for Provera for continuous supression since Lupron not approved.  Message left for pt to inform me if provera is controlling menses.

## 2015-09-06 NOTE — Progress Notes (Signed)
I will reorder the Lupron and once denial received, try to achieve prior authorization

## 2015-09-07 ENCOUNTER — Ambulatory Visit: Payer: BLUE CROSS/BLUE SHIELD | Admitting: Obstetrics and Gynecology

## 2015-09-08 ENCOUNTER — Encounter (HOSPITAL_COMMUNITY)
Admission: RE | Admit: 2015-09-08 | Discharge: 2015-09-08 | Disposition: A | Discharge status: Home / Self Care | Payer: BLUE CROSS/BLUE SHIELD | Source: Ambulatory Visit | Attending: Cardiology | Admitting: Cardiology

## 2015-09-08 DIAGNOSIS — D500 Iron deficiency anemia secondary to blood loss (chronic): Secondary | ICD-10-CM | POA: Diagnosis present

## 2015-09-08 DIAGNOSIS — N925 Other specified irregular menstruation: Secondary | ICD-10-CM | POA: Diagnosis not present

## 2015-09-08 MED ORDER — SODIUM CHLORIDE 0.9 % IV SOLN
Freq: Once | INTRAVENOUS | Status: AC
  Administered 2015-09-08: 250 mL via INTRAVENOUS

## 2015-09-08 MED ORDER — SODIUM CHLORIDE 0.9 % IV SOLN
510.0000 mg | Freq: Once | INTRAVENOUS | Status: AC
  Administered 2015-09-08: 510 mg via INTRAVENOUS
  Filled 2015-09-08: qty 17

## 2015-09-08 NOTE — Progress Notes (Signed)
BP 184/104 post feraheme infusion. Pt states will follow up with primary care physician.

## 2015-09-09 ENCOUNTER — Telehealth (HOSPITAL_COMMUNITY): Payer: Self-pay | Admitting: *Deleted

## 2015-09-09 ENCOUNTER — Telehealth: Payer: Self-pay | Admitting: *Deleted

## 2015-09-09 NOTE — Telephone Encounter (Signed)
[name] [facility], states pt had an appt yesterday and received IV iron x 1. Pt states Dr. Glo Herring told her she would need iron infusion x 3. Pt Hgb 6.9 on 08/17/2015. Does the pt need to receive more IV iron?  Becky informed Dr. Glo Herring was on vacation would route message to Dr. Elonda Husky.

## 2015-09-09 NOTE — Progress Notes (Signed)
09/09/15-F/U from 09/08/15 appt. Pt states that Dr Glo Herring stated that she would need 3 doses of feraheme. Order is for only one dose. Talked with Chrystal at [facility] and she checked with Dr Elonda Husky, Dr Glo Herring on vacation, and he stated that she would need additional doses 2 weeks apart. She will check with Dr Glo Herring when he returns next week. #1 dose given 09/08/15. chrystal will notify us next week with the order.

## 2015-09-09 NOTE — Telephone Encounter (Signed)
She will probably need additional but they have to be at least 2 weeks apart

## 2015-09-13 ENCOUNTER — Telehealth: Payer: Self-pay | Admitting: Obstetrics and Gynecology

## 2015-09-13 NOTE — Telephone Encounter (Signed)
Pt informed per Dr. Elonda Husky IV iron usually scheduled 2 weeks apart. Will send message to Dr. Glo Herring and see if he wants to scheduled more IV iron infusion since pt HGB so low. Pt also informed have not received the Lupron injection. Pt will be contacted about the Lupron and IV infusion follow up appt.

## 2015-09-13 NOTE — Telephone Encounter (Signed)
Duplicate message. 

## 2015-09-15 ENCOUNTER — Telehealth: Payer: Self-pay | Admitting: *Deleted

## 2015-09-15 NOTE — Telephone Encounter (Signed)
Pt states the Megace is not helping with her vaginal bleeding. Pt states waiting on Lupron injection and having iron infusion due to anemia. Please advise.

## 2015-09-18 NOTE — Telephone Encounter (Signed)
Pt called , advised to see me by appt asap for scheduling of surgery. Pt seems ready.

## 2015-09-18 NOTE — Telephone Encounter (Signed)
Pt has appt offered.

## 2015-09-20 ENCOUNTER — Ambulatory Visit: Payer: BLUE CROSS/BLUE SHIELD | Admitting: Allergy and Immunology

## 2015-09-20 ENCOUNTER — Telehealth: Payer: Self-pay | Admitting: Obstetrics and Gynecology

## 2015-09-20 NOTE — Telephone Encounter (Signed)
Pt states that the medication Dr. Glo Herring called in for her will need a prior auth. Please contact pt

## 2015-09-20 NOTE — Telephone Encounter (Signed)
Pt has an appt for 09/21/2015.

## 2015-09-21 ENCOUNTER — Encounter: Payer: Self-pay | Admitting: Obstetrics and Gynecology

## 2015-09-21 ENCOUNTER — Ambulatory Visit (INDEPENDENT_AMBULATORY_CARE_PROVIDER_SITE_OTHER): Payer: BLUE CROSS/BLUE SHIELD | Admitting: Obstetrics and Gynecology

## 2015-09-21 VITALS — BP 156/100 | Ht 63.0 in | Wt 216.0 lb

## 2015-09-21 DIAGNOSIS — D251 Intramural leiomyoma of uterus: Secondary | ICD-10-CM

## 2015-09-21 DIAGNOSIS — N920 Excessive and frequent menstruation with regular cycle: Secondary | ICD-10-CM

## 2015-09-21 DIAGNOSIS — N946 Dysmenorrhea, unspecified: Secondary | ICD-10-CM | POA: Diagnosis not present

## 2015-09-21 DIAGNOSIS — D6489 Other specified anemias: Secondary | ICD-10-CM | POA: Diagnosis not present

## 2015-09-21 LAB — POCT HEMOGLOBIN: Hemoglobin: 11.1 g/dL — AB (ref 12.2–16.2)

## 2015-09-21 NOTE — Progress Notes (Signed)
Patient ID: [name], female   DOB: 02/26/1971, 45 y.o.   MRN: [id number] Pt here today to discuss whether surgery or lupron injection is the next step.

## 2015-09-21 NOTE — Progress Notes (Signed)
Patient ID: [name], female   DOB: 1970/12/27, 45 y.o.   MRN: [id number]  Preoperative History and Physical  [name] is a 45 y.o. No obstetric history on file. here for surgical management of uterine fibroids, anemia, DUB due to fibroids..   No significant preoperative concerns.  Proposed surgery : total Abdominal Hysterectomy , bilateral salpingectomy,  Past Medical History  Diagnosis Date  . Hypertension   . Anemia   . Sickle cell trait ([place])   . Diabetes mellitus without complication ([place])   . Fibroids    Past Surgical History  Procedure Laterality Date  . Tubal ligation     OB History  No data available  Patient denies any other pertinent gynecologic issues.   Current Outpatient Prescriptions on File Prior to Visit  Medication Sig Dispense Refill  . megestrol (MEGACE) 40 MG tablet Take 1 tablet (40 mg total) by mouth 3 (three) times daily. 45 tablet 2  . ferumoxytol (FERAHEME) 510 MG/17ML SOLN injection Inject 17 mLs (510 mg total) into the vein once. (Patient not taking: Reported on 09/21/2015) 15.08 mL 0  . leuprolide (LUPRON) 3.75 MG injection Inject 3.75 mg into the muscle once. (Patient not taking: Reported on 09/21/2015) 1 kit 1   No current facility-administered medications on file prior to visit.   No Known Allergies  Social History:   reports that she has been smoking Cigarettes.  She has a 28 pack-year smoking history. She has never used smokeless tobacco. She reports that she does not drink alcohol or use illicit drugs.  Family History  Problem Relation Age of Onset  . Diabetes Father     Review of Systems: Noncontributory  PHYSICAL EXAM: Blood pressure 156/100, height '5\' 3"'$  (1.6 m), weight 216 lb (97.977 kg). General appearance - alert, well appearing, and in no distress Chest - clear to auscultation, no wheezes, rales or rhonchi, symmetric air entry Heart - normal rate and regular rhythm Abdomen - soft, nontender, nondistended, no masses or  organomegaly Pelvic well supported cervix and uterus, with uterine size 14-16 wk. Extremities - peripheral pulses normal, no pedal edema, no clubbing or cyanosis  Labs: Results for orders placed or performed in visit on 09/21/15 (from the past 336 hour(s))  POCT hemoglobin   Collection Time: 09/21/15  2:44 PM  Result Value Ref Range   Hemoglobin 11.1 (A) 12.2 - 16.2 g/dL   Hemoglobin   Endometrial biopsy: benign secretory, 06/2015   Imaging Studies: No results found.  Assessment:  Patient Active Problem List   Diagnosis Date Noted  . Excessive or frequent menstruation 07/20/2013  . Dysmenorrhea 07/20/2013  . Fibroid, uterine 06/17/2013  . OBESITY 01/07/2008  . NICOTINE ADDICTION 01/07/2008    Plan: Patient will undergo surgical management with abdominal hysterectomy, with removal of cervix and fallopian tubes.  Ovaries to be preserved .     .mec 09/21/2015 4:03 PM

## 2015-09-21 NOTE — Patient Instructions (Signed)
To be scheduled for hysterectomy  Abdominal Hysterectomy, Care After Refer to this sheet in the next few weeks. These instructions provide you with information on caring for yourself after your procedure. Your health care provider may also give you more specific instructions. Your treatment has been planned according to current medical practices, but problems sometimes occur. Call your health care provider if you have any problems or questions after your procedure.  WHAT TO EXPECT AFTER THE PROCEDURE After your procedure, it is typical to have the following:  Pain.  Feeling tired.  Poor appetite.  Less interest in sex. It takes 4-6 weeks to recover from this surgery.  HOME CARE INSTRUCTIONS   Take pain medicines only as directed by your health care provider. Do not take over-the-counter pain medicines without checking with your health care provider first.  Change your bandage as directed by your health care provider.  Return to your health care provider to have your sutures taken out.  Take showers instead of baths for 2-3 weeks. Ask your health care provider when it is safe to start showering.  Do not douche, use tampons, or have sexual intercourse for at least 6 weeks or until your health care provider says you can.   Follow your health care provider's advice about exercise, lifting, driving, and general activities.  Get plenty of rest and sleep.   Do not lift anything heavier than a gallon of milk (about 10 lb [4.5 kg]) for the first month after surgery.  You can resume your normal diet if your health care provider says it is okay.   Do not drink alcohol until your health care provider says you can.   If you are constipated, ask your health care provider if you can take a mild laxative.  Eating foods high in fiber may also help with constipation. Eat plenty of raw fruits and vegetables, whole grains, and beans.  Drink enough fluids to keep your urine clear or pale  yellow.   Try to have someone at home with you for the first 1-2 weeks to help around the house.  Keep all follow-up appointments. SEEK MEDICAL CARE IF:   You have chills or fever.  You have swelling, redness, or pain in the area of your incision that is getting worse.   You have pus coming from the incision.   You notice a bad smell coming from the incision or bandage.   Your incision breaks open.   You feel dizzy or light-headed.   You have pain or bleeding when you urinate.   You have persistent diarrhea.   You have persistent nausea and vomiting.   You have abnormal vaginal discharge.   You have a rash.   You have any type of abnormal reaction or develop an allergy to your medicine.   Your pain medicine is not helping.  SEEK IMMEDIATE MEDICAL CARE IF:   You have a fever and your symptoms suddenly get worse.  You have severe abdominal pain.  You have chest pain.  You have shortness of breath.  You faint.  You have pain, swelling, or redness of your leg.  You have heavy vaginal bleeding with blood clots. MAKE SURE YOU:  Understand these instructions.  Will watch your condition.  Will get help right away if you are not doing well or get worse.   This information is not intended to replace advice given to you by your health care provider. Make sure you discuss any questions you have with your  health care provider.   Document Released: 03/23/2005 Document Revised: 09/24/2014 Document Reviewed: 06/26/2013 Elsevier Interactive Patient Education [employer].

## 2015-09-22 NOTE — Patient Instructions (Signed)
[name]  09/22/2015     @PREFPERIOPPHARMACY @   Your procedure is scheduled on 09/27/2015.  Report to [facility] at 9:00 A.M.  Call this number if you have problems the morning of surgery:  [phone]   Remember:  Do not eat food or drink liquids after midnight.  Take these medicines the morning of surgery with A SIP OF WATER NA   Do not wear jewelry, make-up or nail polish.  Do not wear lotions, powders, or perfumes.  You may wear deodorant.  Do not shave 48 hours prior to surgery.  Men may shave face and neck.  Do not bring valuables to the hospital.  [facility] is not responsible for any belongings or valuables.  Contacts, dentures or bridgework may not be worn into surgery.  Leave your suitcase in the car.  After surgery it may be brought to your room.  For patients admitted to the hospital, discharge time will be determined by your treatment team.  Patients discharged the day of surgery will not be allowed to drive home.    Please read over the following fact sheets that you were given. Surgical Site Infection Prevention and Anesthesia Post-op Instructions     PATIENT INSTRUCTIONS POST-ANESTHESIA  IMMEDIATELY FOLLOWING SURGERY:  Do not drive or operate machinery for the first twenty four hours after surgery.  Do not make any important decisions for twenty four hours after surgery or while taking narcotic pain medications or sedatives.  If you develop intractable nausea and vomiting or a severe headache please notify your doctor immediately.  FOLLOW-UP:  Please make an appointment with your surgeon as instructed. You do not need to follow up with anesthesia unless specifically instructed to do so.  WOUND CARE INSTRUCTIONS (if applicable):  Keep a dry clean dressing on the anesthesia/puncture wound site if there is drainage.  Once the wound has quit draining you may leave it open to air.  Generally you should leave the bandage intact for twenty four hours unless  there is drainage.  If the epidural site drains for more than 36-48 hours please call the anesthesia department.  QUESTIONS?:  Please feel free to call your physician or the hospital operator if you have any questions, and they will be happy to assist you.      Salpingectomy Salpingectomy, also called tubectomy, is the surgical removal of one of the fallopian tubes. The fallopian tubes are tubes that are connected to the uterus. These tubes transport the egg from the ovary to the uterus. A salpingectomy may be done for various reasons, including:   A tubal (ectopic) pregnancy. This is especially true if the tube ruptures.  An infected fallopian tube.  The need to remove the fallopian tube when removing an ovary with a cyst or tumor.  The need to remove the fallopian tube when removing the uterus.  Cancer of the fallopian tube or nearby organs. Removing one fallopian tube does not prevent you from becoming pregnant. It also does not cause problems with your menstrual periods.  LET [facility] CARE PROVIDER KNOW ABOUT:  Any allergies you have.  All medicines you are taking, including vitamins, herbs, eye drops, creams, and over-the-counter medicines.  Previous problems you or members of your family have had with the use of anesthetics.  Any blood disorders you have.  Previous surgeries you have had.  Medical conditions you have. RISKS AND COMPLICATIONS  Generally, this is a safe procedure. However, as with any procedure, complications can  occur. Possible complications include:  Injury to surrounding organs.  Bleeding.  Infection.  Problems related to anesthesia. BEFORE THE PROCEDURE  Ask your health care provider about changing or stopping your regular medicines. You may need to stop taking certain medicines, such as aspirin or blood thinners, at least 1 week before the surgery.  Do not eat or drink anything for at least 8 hours before the surgery.  If you smoke, do not  smoke for at least 2 weeks before the surgery.  Make plans to have someone drive you home after the procedure or after your hospital stay. Also arrange for someone to help you with activities during recovery. PROCEDURE   You will be given medicine to help you relax before the procedure (sedative). You will then be given medicine to make you sleep through the procedure (general anesthetic). These medicines will be given through an IV access tube that is put into one of your veins.  Once you are asleep, your lower abdomen will be shaved and cleaned. A thin, flexible tube (catheter) will be placed in your bladder.  The surgeon may use a laparoscopic, robotic, or open technique for this surgery:  In the laparoscopic technique, the surgery is done through two small cuts (incisions) in the abdomen. A thin, lighted tube with a tiny camera on the end (laparoscope) is inserted into one of the incisions. The tools needed for the procedure are put through the other incision.  A robotic technique may be chosen to perform complex surgery in a small space. In the robotic technique, small incisions will be made. A camera and surgical instruments are passed through the incisions. Surgical instruments will be controlled with the help of a robotic arm.  In the open technique, the surgery is done through one large incision in the abdomen.  Using any of these techniques, the surgeon removes the fallopian tube from where it attaches to the uterus. The blood vessels will be clamped and tied.  The surgeon then uses staples or stitches to close the incision or incisions. AFTER THE PROCEDURE   You will be taken to a recovery area where your progress will be monitored for 1-3 hours.  If the laparoscopic technique was used, you may be allowed to go home after several hours. You may have some shoulder pain after the laparoscopic procedure. This is normal and usually goes away in a day or two.  If the open technique  was used, you will be admitted to the hospital for a couple of days.  You will be given pain medicine if needed.  The IV access tube and catheter will be removed before you are discharged.   This information is not intended to replace advice given to you by your health care provider. Make sure you discuss any questions you have with your health care provider.   Document Released: 01/20/2009 Document Revised: 09/24/2014 Document Reviewed: 02/25/2013 Elsevier Interactive Patient Education 2016 [place]. Abdominal Hysterectomy Abdominal hysterectomy is a surgical procedure to remove your womb (uterus). Your uterus is the muscular organ that contains a developing baby. This surgery is done for many reasons. You may need an abdominal hysterectomy if you have cancer, growths (tumors), long-term pain, or bleeding. You may also have this procedure if your uterus has slipped down into your vagina (uterine prolapse). Depending on why you need an abdominal hysterectomy, you may also have other reproductive organs removed. These could include the part of your vagina that connects with your uterus (cervix), the  organs that make eggs (ovaries), and the tubes that connect the ovaries to the uterus (fallopian tubes). LET [facility] CARE PROVIDER KNOW ABOUT:   Any allergies you have.  All medicines you are taking, including vitamins, herbs, eye drops, creams, and over-the-counter medicines.  Previous problems you or members of your family have had with the use of anesthetics.  Any blood disorders you have.  Previous surgeries you have had.  Medical conditions you have. RISKS AND COMPLICATIONS Generally, this is a safe procedure. However, as with any procedure, problems can occur. Infection is the most common problem after an abdominal hysterectomy. Other possible problems include:  Bleeding.  Formation of blood clots that may break free and travel to your lungs.  Injury to other organs near  your uterus.  Nerve injury causing nerve pain.  Decreased interest in sex or pain during sexual intercourse. BEFORE THE PROCEDURE  Abdominal hysterectomy is a major surgical procedure. It can affect the way you feel about yourself. Talk to your health care provider about the physical and emotional changes hysterectomy may cause.  You may need to have blood work and X-rays done before surgery.  Quit smoking if you smoke. Ask your health care provider for help if you are struggling to quit.  Stop taking medicines that thin your blood as directed by your health care provider.  You may be instructed to take antibiotic medicines or laxatives before surgery.  Do not eat or drink anything for 6-8 hours before surgery.  Take your regular medicines with a small sip of water.  Bathe or shower the night or morning before surgery. PROCEDURE  Abdominal hysterectomy is done in the operating room at the hospital.  In most cases, you will be given a medicine that makes you go to sleep (general anesthetic).  The surgeon will make a cut (incision) through the skin in your lower belly.  The incision may be about 5-7 inches long. It may go side-to-side or up-and-down.  The surgeon will move aside the body tissue that covers your uterus. The surgeon will then carefully take out your uterus along with any of your other reproductive organs that need to be removed.  Bleeding will be controlled with clamps or sutures.  The surgeon will close your incision with sutures or metal clips. AFTER THE PROCEDURE  You will have some pain immediately after the procedure.  You will be given pain medicine in the recovery room.  You will be taken to your hospital room when you have recovered from the anesthesia.  You may need to stay in the hospital for 2-5 days.  You will be given instructions for recovery at home.   This information is not intended to replace advice given to you by your health care  provider. Make sure you discuss any questions you have with your health care provider.   Document Released: 09/08/2013 Document Reviewed: 09/08/2013 Elsevier Interactive Patient Education [employer].

## 2015-09-23 ENCOUNTER — Encounter (HOSPITAL_COMMUNITY): Payer: Self-pay

## 2015-09-23 ENCOUNTER — Encounter (HOSPITAL_COMMUNITY)
Admission: RE | Admit: 2015-09-23 | Discharge: 2015-09-23 | Disposition: A | Discharge status: Home / Self Care | Payer: BLUE CROSS/BLUE SHIELD | Source: Ambulatory Visit | Attending: Obstetrics and Gynecology | Admitting: Obstetrics and Gynecology

## 2015-09-23 ENCOUNTER — Other Ambulatory Visit: Payer: Self-pay | Admitting: Obstetrics and Gynecology

## 2015-09-23 DIAGNOSIS — Z01818 Encounter for other preprocedural examination: Secondary | ICD-10-CM | POA: Insufficient documentation

## 2015-09-23 DIAGNOSIS — D259 Leiomyoma of uterus, unspecified: Secondary | ICD-10-CM | POA: Diagnosis not present

## 2015-09-23 LAB — COMPREHENSIVE METABOLIC PANEL
ALK PHOS: 38 U/L (ref 38–126)
ALT: 16 U/L (ref 14–54)
AST: 22 U/L (ref 15–41)
Albumin: 4.3 g/dL (ref 3.5–5.0)
Anion gap: 8 (ref 5–15)
BUN: 13 mg/dL (ref 6–20)
CALCIUM: 9.8 mg/dL (ref 8.9–10.3)
CO2: 28 mmol/L (ref 22–32)
CREATININE: 0.61 mg/dL (ref 0.44–1.00)
Chloride: 104 mmol/L (ref 101–111)
Glucose, Bld: 88 mg/dL (ref 65–99)
Potassium: 4.2 mmol/L (ref 3.5–5.1)
SODIUM: 140 mmol/L (ref 135–145)
Total Bilirubin: 0.6 mg/dL (ref 0.3–1.2)
Total Protein: 7.5 g/dL (ref 6.5–8.1)

## 2015-09-23 LAB — CBC
HCT: 36.9 % (ref 36.0–46.0)
HEMOGLOBIN: 11.7 g/dL — AB (ref 12.0–15.0)
MCH: 23.6 pg — AB (ref 26.0–34.0)
MCHC: 31.7 g/dL (ref 30.0–36.0)
MCV: 74.4 fL — AB (ref 78.0–100.0)
Platelets: 356 10*3/uL (ref 150–400)
RBC: 4.96 MIL/uL (ref 3.87–5.11)
RDW: 29.8 % — ABNORMAL HIGH (ref 11.5–15.5)
WBC: 8.7 10*3/uL (ref 4.0–10.5)

## 2015-09-23 LAB — URINALYSIS, ROUTINE W REFLEX MICROSCOPIC
Bilirubin Urine: NEGATIVE
GLUCOSE, UA: NEGATIVE mg/dL
KETONES UR: NEGATIVE mg/dL
LEUKOCYTES UA: NEGATIVE
NITRITE: NEGATIVE
PROTEIN: 30 mg/dL — AB
Specific Gravity, Urine: 1.02 (ref 1.005–1.030)
pH: 6 (ref 5.0–8.0)

## 2015-09-23 LAB — URINE MICROSCOPIC-ADD ON
Bacteria, UA: NONE SEEN
WBC, UA: NONE SEEN WBC/hpf (ref 0–5)

## 2015-09-23 LAB — HCG, SERUM, QUALITATIVE: PREG SERUM: NEGATIVE

## 2015-09-23 NOTE — Pre-Procedure Instructions (Signed)
Patient given information to sign up for my chart at home. 

## 2015-09-24 LAB — RPR: RPR Ser Ql: NONREACTIVE

## 2015-09-26 NOTE — H&P (Signed)
  Expand All Collapse All   Patient ID: [name], female DOB: 12-Jun-1971, 45 y.o. MRN: [id number]  Preoperative History and Physical  [name] is a 45 y.o. No obstetric history on file. here for surgical management of uterine fibroids, anemia, DUB due to fibroids.. No significant preoperative concerns.  Proposed surgery : total Abdominal Hysterectomy , bilateral salpingectomy,  Past Medical History  Diagnosis Date  . Hypertension   . Anemia   . Sickle cell trait ([place])   . Diabetes mellitus without complication ([place])   . Fibroids    Past Surgical History  Procedure Laterality Date  . Tubal ligation     OB History  No data available  Patient denies any other pertinent gynecologic issues.   Current Outpatient Prescriptions on File Prior to Visit  Medication Sig Dispense Refill  . megestrol (MEGACE) 40 MG tablet Take 1 tablet (40 mg total) by mouth 3 (three) times daily. 45 tablet 2  . ferumoxytol (FERAHEME) 510 MG/17ML SOLN injection Inject 17 mLs (510 mg total) into the vein once. (Patient not taking: Reported on 09/21/2015) 15.08 mL 0  . leuprolide (LUPRON) 3.75 MG injection Inject 3.75 mg into the muscle once. (Patient not taking: Reported on 09/21/2015) 1 kit 1   No current facility-administered medications on file prior to visit.   No Known Allergies  Social History:  reports that she has been smoking Cigarettes. She has a 28 pack-year smoking history. She has never used smokeless tobacco. She reports that she does not drink alcohol or use illicit drugs.  Family History  Problem Relation Age of Onset  . Diabetes Father     Review of Systems: Noncontributory  PHYSICAL EXAM: Blood pressure 156/100, height '5\' 3"'$  (1.6 m), weight 216 lb (97.977 kg). General appearance - alert, well appearing, and in no distress Chest - clear to auscultation, no wheezes, rales or rhonchi, symmetric air  entry Heart - normal rate and regular rhythm Abdomen - soft, nontender, nondistended, no masses or organomegaly Pelvic well supported cervix and uterus, with uterine size 14-16 wk. Extremities - peripheral pulses normal, no pedal edema, no clubbing or cyanosis  Labs: Results for orders placed or performed in visit on 09/21/15 (from the past 336 hour(s))  POCT hemoglobin   Collection Time: 09/21/15 2:44 PM  Result Value Ref Range   Hemoglobin 11.1 (A) 12.2 - 16.2 g/dL   Hemoglobin   Endometrial biopsy: benign secretory, 06/2015   Imaging Studies:  Imaging Results    No results found.    Assessment:  Patient Active Problem List   Diagnosis Date Noted  . Excessive or frequent menstruation 07/20/2013  . Dysmenorrhea 07/20/2013  . Fibroid, uterine 06/17/2013  . OBESITY 01/07/2008  . NICOTINE ADDICTION 01/07/2008    Plan: Patient will undergo surgical management with abdominal hysterectomy, with removal of cervix and fallopian tubes.  Ovaries to be preserved .     .mec 09/21/2015 4:03 PM

## 2015-09-27 ENCOUNTER — Telehealth: Payer: Self-pay | Admitting: Obstetrics and Gynecology

## 2015-09-27 ENCOUNTER — Telehealth: Payer: Self-pay | Admitting: *Deleted

## 2015-09-27 NOTE — Telephone Encounter (Signed)
Pt requesting a note to return to work today. Note given to pt.

## 2015-09-27 NOTE — Telephone Encounter (Signed)
Pt states that she went to the nurse and spoke with [employer] states that she needs a note that states that she does not need to return to work until after surgery and the reason why. [phone] Attention Rosanne.

## 2015-09-27 NOTE — Telephone Encounter (Signed)
Lupron will not be able to be given, Prior Authorization not cleared.  Pt scheduled for surgery as hgb has recovered while on Megace.

## 2015-09-27 NOTE — Telephone Encounter (Signed)
Pt informed will talk with Dr. Glo Herring about date to reschedule surgery and see about getting a work note starting leave today. Pt verbalized understanding.

## 2015-09-27 NOTE — Telephone Encounter (Signed)
Note completed and given to pt for her employer stating pt surgery not performed 09/26/2014 and will be rescheduled on a later date.

## 2015-09-27 NOTE — Telephone Encounter (Signed)
Pt called stating that she would like a call back from Either [place] or [place]. Please contact pt

## 2015-09-27 NOTE — Telephone Encounter (Signed)
Message left for pt to notify her of physician illness this morning preventing surgery. I will work with office to reschedule tomorrow upon return to office.

## 2015-09-28 NOTE — Telephone Encounter (Addendum)
Per Dr. Glo Herring reschedule pt surgery to 10/04/2015 and pt can get an excuse for work starting on 09/27/2015. Note completed and faxed to [phone], Att Rosanne. Pt will need to go to [facility] on 09/30/2015 at 2:15 pm.

## 2015-09-28 NOTE — Telephone Encounter (Signed)
Pt informed surgery rescheduled to 10/04/2015 with Dr. Glo Herring, Pre op [facility] 09/30/2015 @ 2:15 pm. Work noted faxed per her request.

## 2015-09-30 ENCOUNTER — Encounter (HOSPITAL_COMMUNITY): Admission: RE | Admit: 2015-09-30 | Payer: BLUE CROSS/BLUE SHIELD | Source: Ambulatory Visit

## 2015-09-30 ENCOUNTER — Other Ambulatory Visit (HOSPITAL_COMMUNITY): Payer: BLUE CROSS/BLUE SHIELD

## 2015-10-03 NOTE — Progress Notes (Signed)
Verified with lab tech, Ed, that Type and Screen is still good until 10/07/15.

## 2015-10-04 ENCOUNTER — Encounter (HOSPITAL_COMMUNITY): Payer: Self-pay | Admitting: *Deleted

## 2015-10-04 ENCOUNTER — Inpatient Hospital Stay (HOSPITAL_COMMUNITY)
Admission: RE | Admit: 2015-10-04 | Discharge: 2015-10-06 | DRG: 743 | Disposition: A | Discharge status: Home / Self Care | Payer: BLUE CROSS/BLUE SHIELD | Source: Ambulatory Visit | Attending: Obstetrics and Gynecology | Admitting: Obstetrics and Gynecology

## 2015-10-04 ENCOUNTER — Encounter (HOSPITAL_COMMUNITY)
Admission: RE | Disposition: A | Discharge status: Home / Self Care | Payer: Self-pay | Source: Ambulatory Visit | Attending: Obstetrics and Gynecology

## 2015-10-04 ENCOUNTER — Inpatient Hospital Stay (HOSPITAL_COMMUNITY): Payer: BLUE CROSS/BLUE SHIELD | Admitting: Anesthesiology

## 2015-10-04 DIAGNOSIS — Z23 Encounter for immunization: Secondary | ICD-10-CM | POA: Diagnosis not present

## 2015-10-04 DIAGNOSIS — R0789 Other chest pain: Secondary | ICD-10-CM | POA: Diagnosis present

## 2015-10-04 DIAGNOSIS — F419 Anxiety disorder, unspecified: Secondary | ICD-10-CM | POA: Diagnosis present

## 2015-10-04 DIAGNOSIS — D259 Leiomyoma of uterus, unspecified: Secondary | ICD-10-CM | POA: Diagnosis present

## 2015-10-04 DIAGNOSIS — D252 Subserosal leiomyoma of uterus: Secondary | ICD-10-CM | POA: Diagnosis not present

## 2015-10-04 DIAGNOSIS — D573 Sickle-cell trait: Secondary | ICD-10-CM | POA: Diagnosis present

## 2015-10-04 DIAGNOSIS — Z833 Family history of diabetes mellitus: Secondary | ICD-10-CM

## 2015-10-04 DIAGNOSIS — N920 Excessive and frequent menstruation with regular cycle: Secondary | ICD-10-CM

## 2015-10-04 DIAGNOSIS — I10 Essential (primary) hypertension: Secondary | ICD-10-CM | POA: Diagnosis present

## 2015-10-04 DIAGNOSIS — D251 Intramural leiomyoma of uterus: Secondary | ICD-10-CM | POA: Diagnosis not present

## 2015-10-04 DIAGNOSIS — Z90710 Acquired absence of both cervix and uterus: Secondary | ICD-10-CM | POA: Diagnosis not present

## 2015-10-04 DIAGNOSIS — E119 Type 2 diabetes mellitus without complications: Secondary | ICD-10-CM | POA: Diagnosis present

## 2015-10-04 DIAGNOSIS — F411 Generalized anxiety disorder: Secondary | ICD-10-CM | POA: Diagnosis not present

## 2015-10-04 DIAGNOSIS — R079 Chest pain, unspecified: Secondary | ICD-10-CM

## 2015-10-04 DIAGNOSIS — D500 Iron deficiency anemia secondary to blood loss (chronic): Secondary | ICD-10-CM | POA: Diagnosis not present

## 2015-10-04 DIAGNOSIS — F17210 Nicotine dependence, cigarettes, uncomplicated: Secondary | ICD-10-CM | POA: Diagnosis present

## 2015-10-04 DIAGNOSIS — N946 Dysmenorrhea, unspecified: Secondary | ICD-10-CM | POA: Diagnosis present

## 2015-10-04 HISTORY — PX: BILATERAL SALPINGECTOMY: SHX5743

## 2015-10-04 HISTORY — PX: ABDOMINAL HYSTERECTOMY: SHX81

## 2015-10-04 LAB — GLUCOSE, CAPILLARY
Glucose-Capillary: 107 mg/dL — ABNORMAL HIGH (ref 65–99)
Glucose-Capillary: 136 mg/dL — ABNORMAL HIGH (ref 65–99)
Glucose-Capillary: 134 mg/dL — ABNORMAL HIGH (ref 65–99)

## 2015-10-04 LAB — PREPARE RBC (CROSSMATCH)

## 2015-10-04 LAB — ABO/RH: ABO/RH(D): A POS

## 2015-10-04 SURGERY — HYSTERECTOMY, ABDOMINAL
Anesthesia: General | Site: Abdomen

## 2015-10-04 MED ORDER — IBUPROFEN 600 MG PO TABS: 600.0000 mg | ORAL_TABLET | Freq: Four times a day (QID) | ORAL | Status: DC | PRN

## 2015-10-04 MED ORDER — LIDOCAINE HCL (CARDIAC) 20 MG/ML IV SOLN
INTRAVENOUS | Status: DC | PRN
  Administered 2015-10-04: 40 mg via INTRATRACHEAL

## 2015-10-04 MED ORDER — 0.9 % SODIUM CHLORIDE (POUR BTL) OPTIME
TOPICAL | Status: DC | PRN
  Administered 2015-10-04 (×2): 1000 mL

## 2015-10-04 MED ORDER — INFLUENZA VAC SPLIT QUAD 0.5 ML IM SUSY: 0.5000 mL | PREFILLED_SYRINGE | INTRAMUSCULAR | Status: DC

## 2015-10-04 MED ORDER — MIDAZOLAM HCL 2 MG/2ML IJ SOLN
1.0000 mg | INTRAMUSCULAR | Status: DC | PRN
  Administered 2015-10-04: 2 mg via INTRAVENOUS

## 2015-10-04 MED ORDER — LABETALOL HCL 5 MG/ML IV SOLN
INTRAVENOUS | Status: AC
  Filled 2015-10-04: qty 4

## 2015-10-04 MED ORDER — SODIUM CHLORIDE 0.9 % IJ SOLN
INTRAMUSCULAR | Status: AC
  Filled 2015-10-04: qty 10

## 2015-10-04 MED ORDER — ONDANSETRON HCL 4 MG/2ML IJ SOLN
4.0000 mg | Freq: Four times a day (QID) | INTRAMUSCULAR | Status: DC | PRN
  Administered 2015-10-05: 4 mg via INTRAVENOUS
  Filled 2015-10-04: qty 2

## 2015-10-04 MED ORDER — SUCCINYLCHOLINE CHLORIDE 20 MG/ML IJ SOLN
INTRAMUSCULAR | Status: AC
  Filled 2015-10-04: qty 1

## 2015-10-04 MED ORDER — AMLODIPINE BESYLATE 5 MG PO TABS
10.0000 mg | ORAL_TABLET | Freq: Every day | ORAL | Status: DC
  Administered 2015-10-04 – 2015-10-06 (×3): 10 mg via ORAL
  Filled 2015-10-04 (×3): qty 2

## 2015-10-04 MED ORDER — NEOSTIGMINE METHYLSULFATE 10 MG/10ML IV SOLN
INTRAVENOUS | Status: DC | PRN
  Administered 2015-10-04: 3 mg via INTRAVENOUS

## 2015-10-04 MED ORDER — SODIUM CHLORIDE 0.9 % IV SOLN
Freq: Once | INTRAVENOUS | Status: AC
  Administered 2015-10-04: 12:00:00 via INTRAVENOUS

## 2015-10-04 MED ORDER — HYDROMORPHONE HCL 1 MG/ML IJ SOLN
0.5000 mg | INTRAMUSCULAR | Status: AC | PRN
  Administered 2015-10-04 (×4): 0.5 mg via INTRAVENOUS
  Filled 2015-10-04: qty 1

## 2015-10-04 MED ORDER — ONDANSETRON HCL 4 MG/2ML IJ SOLN
4.0000 mg | Freq: Once | INTRAMUSCULAR | Status: AC
  Administered 2015-10-04: 4 mg via INTRAVENOUS

## 2015-10-04 MED ORDER — HYDROMORPHONE 1 MG/ML IV SOLN
INTRAVENOUS | Status: DC
  Administered 2015-10-04: 13:00:00 via INTRAVENOUS
  Administered 2015-10-05: 3.3 mg via INTRAVENOUS
  Administered 2015-10-05: 2.1 mg via INTRAVENOUS
  Administered 2015-10-05: 1.5 mg via INTRAVENOUS
  Administered 2015-10-05: 2.1 mg via INTRAVENOUS
  Administered 2015-10-06: 1.8 mg via INTRAVENOUS
  Administered 2015-10-06: 05:00:00 via INTRAVENOUS
  Filled 2015-10-04 (×2): qty 25

## 2015-10-04 MED ORDER — NALOXONE HCL 0.4 MG/ML IJ SOLN: 0.4000 mg | INTRAMUSCULAR | Status: DC | PRN

## 2015-10-04 MED ORDER — ONDANSETRON HCL 4 MG/2ML IJ SOLN
INTRAMUSCULAR | Status: AC
  Filled 2015-10-04: qty 2

## 2015-10-04 MED ORDER — ONDANSETRON HCL 4 MG/2ML IJ SOLN: 4.0000 mg | Freq: Four times a day (QID) | INTRAMUSCULAR | Status: DC | PRN

## 2015-10-04 MED ORDER — FENTANYL CITRATE (PF) 250 MCG/5ML IJ SOLN
INTRAMUSCULAR | Status: AC
  Filled 2015-10-04: qty 5

## 2015-10-04 MED ORDER — OXYCODONE-ACETAMINOPHEN 5-325 MG PO TABS
1.0000 | ORAL_TABLET | ORAL | Status: DC | PRN
  Administered 2015-10-06: 1 via ORAL
  Filled 2015-10-04: qty 1

## 2015-10-04 MED ORDER — LABETALOL HCL 5 MG/ML IV SOLN
INTRAVENOUS | Status: DC | PRN
  Administered 2015-10-04 (×2): 2.5 mg via INTRAVENOUS
  Administered 2015-10-04: 5 mg via INTRAVENOUS
  Administered 2015-10-04: 2.5 mg via INTRAVENOUS

## 2015-10-04 MED ORDER — GLYCOPYRROLATE 0.2 MG/ML IJ SOLN
INTRAMUSCULAR | Status: AC
  Filled 2015-10-04: qty 3

## 2015-10-04 MED ORDER — LACTATED RINGERS IV SOLN
INTRAVENOUS | Status: DC
  Administered 2015-10-04 (×2): via INTRAVENOUS
  Administered 2015-10-04: 1000 mL via INTRAVENOUS

## 2015-10-04 MED ORDER — SUCCINYLCHOLINE CHLORIDE 20 MG/ML IJ SOLN
INTRAMUSCULAR | Status: DC | PRN
  Administered 2015-10-04: 100 mg via INTRAVENOUS

## 2015-10-04 MED ORDER — PNEUMOCOCCAL VAC POLYVALENT 25 MCG/0.5ML IJ INJ
0.5000 mL | INJECTION | INTRAMUSCULAR | Status: DC
  Filled 2015-10-04: qty 0.5

## 2015-10-04 MED ORDER — MIDAZOLAM HCL 2 MG/2ML IJ SOLN
INTRAMUSCULAR | Status: AC
  Filled 2015-10-04: qty 2

## 2015-10-04 MED ORDER — HYDROMORPHONE HCL 1 MG/ML IJ SOLN
INTRAMUSCULAR | Status: AC
  Filled 2015-10-04: qty 1

## 2015-10-04 MED ORDER — ROCURONIUM BROMIDE 50 MG/5ML IV SOLN
INTRAVENOUS | Status: AC
  Filled 2015-10-04: qty 1

## 2015-10-04 MED ORDER — KETOROLAC TROMETHAMINE 30 MG/ML IJ SOLN
30.0000 mg | Freq: Once | INTRAMUSCULAR | Status: AC
  Administered 2015-10-04: 30 mg via INTRAVENOUS
  Filled 2015-10-04: qty 1

## 2015-10-04 MED ORDER — LIDOCAINE HCL (PF) 1 % IJ SOLN
INTRAMUSCULAR | Status: AC
  Filled 2015-10-04: qty 5

## 2015-10-04 MED ORDER — PROPOFOL 10 MG/ML IV BOLUS
INTRAVENOUS | Status: DC | PRN
  Administered 2015-10-04: 150 mg via INTRAVENOUS

## 2015-10-04 MED ORDER — FENTANYL CITRATE (PF) 100 MCG/2ML IJ SOLN
INTRAMUSCULAR | Status: AC
  Filled 2015-10-04: qty 2

## 2015-10-04 MED ORDER — GLYCOPYRROLATE 0.2 MG/ML IJ SOLN
INTRAMUSCULAR | Status: DC | PRN
  Administered 2015-10-04: 0.6 mg via INTRAVENOUS

## 2015-10-04 MED ORDER — FENTANYL CITRATE (PF) 100 MCG/2ML IJ SOLN
25.0000 ug | INTRAMUSCULAR | Status: DC | PRN
  Administered 2015-10-04 (×4): 50 ug via INTRAVENOUS
  Filled 2015-10-04 (×2): qty 2

## 2015-10-04 MED ORDER — ONDANSETRON HCL 4 MG/2ML IJ SOLN: 4.0000 mg | Freq: Once | INTRAMUSCULAR | Status: DC | PRN

## 2015-10-04 MED ORDER — INSULIN ASPART 100 UNIT/ML ~~LOC~~ SOLN
0.0000 [IU] | Freq: Three times a day (TID) | SUBCUTANEOUS | Status: DC
  Administered 2015-10-05: 1 [IU] via SUBCUTANEOUS
  Administered 2015-10-05: 2 [IU] via SUBCUTANEOUS

## 2015-10-04 MED ORDER — SODIUM CHLORIDE 0.9 % IV SOLN
INTRAVENOUS | Status: DC
  Administered 2015-10-04 – 2015-10-06 (×4): via INTRAVENOUS

## 2015-10-04 MED ORDER — BUPIVACAINE HCL (PF) 0.5 % IJ SOLN
INTRAMUSCULAR | Status: DC | PRN
  Administered 2015-10-04: 10 mL

## 2015-10-04 MED ORDER — ROCURONIUM BROMIDE 100 MG/10ML IV SOLN
INTRAVENOUS | Status: DC | PRN
  Administered 2015-10-04: 35 mg via INTRAVENOUS
  Administered 2015-10-04 (×2): 5 mg via INTRAVENOUS
  Administered 2015-10-04: 10 mg via INTRAVENOUS

## 2015-10-04 MED ORDER — PANTOPRAZOLE SODIUM 40 MG PO TBEC
40.0000 mg | DELAYED_RELEASE_TABLET | Freq: Every day | ORAL | Status: DC
  Administered 2015-10-05: 40 mg via ORAL
  Filled 2015-10-04: qty 1

## 2015-10-04 MED ORDER — DIPHENHYDRAMINE HCL 50 MG/ML IJ SOLN: 12.5000 mg | Freq: Four times a day (QID) | INTRAMUSCULAR | Status: DC | PRN

## 2015-10-04 MED ORDER — SODIUM CHLORIDE 0.9 % IJ SOLN: 9.0000 mL | INTRAMUSCULAR | Status: DC | PRN

## 2015-10-04 MED ORDER — FENTANYL CITRATE (PF) 250 MCG/5ML IJ SOLN
INTRAMUSCULAR | Status: DC | PRN
  Administered 2015-10-04: 25 ug via INTRAVENOUS
  Administered 2015-10-04 (×4): 50 ug via INTRAVENOUS
  Administered 2015-10-04: 25 ug via INTRAVENOUS
  Administered 2015-10-04 (×2): 50 ug via INTRAVENOUS
  Administered 2015-10-04: 100 ug via INTRAVENOUS
  Administered 2015-10-04: 50 ug via INTRAVENOUS

## 2015-10-04 MED ORDER — ONDANSETRON HCL 4 MG PO TABS
4.0000 mg | ORAL_TABLET | Freq: Four times a day (QID) | ORAL | Status: DC | PRN
  Administered 2015-10-05: 4 mg via ORAL
  Filled 2015-10-04: qty 1

## 2015-10-04 MED ORDER — BUPIVACAINE HCL (PF) 0.5 % IJ SOLN
INTRAMUSCULAR | Status: AC
  Filled 2015-10-04: qty 30

## 2015-10-04 MED ORDER — DEXAMETHASONE SODIUM PHOSPHATE 4 MG/ML IJ SOLN
INTRAMUSCULAR | Status: AC
  Filled 2015-10-04: qty 1

## 2015-10-04 MED ORDER — KETOROLAC TROMETHAMINE 30 MG/ML IJ SOLN: 30.0000 mg | Freq: Four times a day (QID) | INTRAMUSCULAR | Status: DC

## 2015-10-04 MED ORDER — DEXAMETHASONE SODIUM PHOSPHATE 4 MG/ML IJ SOLN
4.0000 mg | Freq: Once | INTRAMUSCULAR | Status: AC
  Administered 2015-10-04: 4 mg via INTRAVENOUS

## 2015-10-04 MED ORDER — ALUM & MAG HYDROXIDE-SIMETH 200-200-20 MG/5ML PO SUSP
30.0000 mL | ORAL | Status: DC | PRN
  Administered 2015-10-04 – 2015-10-05 (×2): 30 mL via ORAL
  Filled 2015-10-04 (×2): qty 30

## 2015-10-04 MED ORDER — EPHEDRINE SULFATE 50 MG/ML IJ SOLN
INTRAMUSCULAR | Status: AC
  Filled 2015-10-04: qty 1

## 2015-10-04 MED ORDER — KETOROLAC TROMETHAMINE 30 MG/ML IJ SOLN
30.0000 mg | Freq: Four times a day (QID) | INTRAMUSCULAR | Status: DC
  Administered 2015-10-04 – 2015-10-06 (×6): 30 mg via INTRAVENOUS
  Filled 2015-10-04 (×6): qty 1

## 2015-10-04 MED ORDER — DIPHENHYDRAMINE HCL 12.5 MG/5ML PO ELIX: 12.5000 mg | ORAL_SOLUTION | Freq: Four times a day (QID) | ORAL | Status: DC | PRN

## 2015-10-04 MED ORDER — CEFAZOLIN SODIUM-DEXTROSE 2-3 GM-% IV SOLR
2.0000 g | INTRAVENOUS | Status: AC
  Administered 2015-10-04: 2 g via INTRAVENOUS
  Filled 2015-10-04: qty 50

## 2015-10-04 MED ORDER — ALUM HYDROXIDE-MAG TRISILICATE 80-20 MG PO CHEW
2.0000 | CHEWABLE_TABLET | ORAL | Status: DC | PRN
  Filled 2015-10-04: qty 2

## 2015-10-04 MED ORDER — NEOSTIGMINE METHYLSULFATE 10 MG/10ML IV SOLN
INTRAVENOUS | Status: AC
  Filled 2015-10-04: qty 1

## 2015-10-04 MED ORDER — NICOTINE 21 MG/24HR TD PT24
21.0000 mg | MEDICATED_PATCH | Freq: Every day | TRANSDERMAL | Status: DC
  Administered 2015-10-04 – 2015-10-06 (×3): 21 mg via TRANSDERMAL
  Filled 2015-10-04 (×3): qty 1

## 2015-10-04 MED ORDER — PROPOFOL 10 MG/ML IV BOLUS
INTRAVENOUS | Status: AC
  Filled 2015-10-04: qty 20

## 2015-10-04 SURGICAL SUPPLY — 59 items
APPLIER CLIP 11 MED OPEN (CLIP)
APPLIER CLIP 13 LRG OPEN (CLIP)
BAG HAMPER (MISCELLANEOUS) ×4 IMPLANT
BENZOIN TINCTURE PRP APPL 2/3 (GAUZE/BANDAGES/DRESSINGS) ×4 IMPLANT
CELLS DAT CNTRL 66122 CELL SVR (MISCELLANEOUS) IMPLANT
CLIP APPLIE 11 MED OPEN (CLIP) IMPLANT
CLIP APPLIE 13 LRG OPEN (CLIP) IMPLANT
CLOSURE WOUND 1/2 X4 (GAUZE/BANDAGES/DRESSINGS) ×2
CLOTH BEACON ORANGE TIMEOUT ST (SAFETY) ×4 IMPLANT
COVER LIGHT HANDLE STERIS (MISCELLANEOUS) ×8 IMPLANT
DRAPE WARM FLUID 44X44 (DRAPE) ×4 IMPLANT
DRSG OPSITE POSTOP 4X10 (GAUZE/BANDAGES/DRESSINGS) ×4 IMPLANT
DRSG OPSITE POSTOP 4X8 (GAUZE/BANDAGES/DRESSINGS) IMPLANT
DURAPREP 26ML APPLICATOR (WOUND CARE) ×4 IMPLANT
ELECT REM PT RETURN 9FT ADLT (ELECTROSURGICAL) ×4
ELECTRODE REM PT RTRN 9FT ADLT (ELECTROSURGICAL) ×2 IMPLANT
EVACUATOR DRAINAGE 10X20 100CC (DRAIN) IMPLANT
EVACUATOR SILICONE 100CC (DRAIN)
FORMALIN 10 PREFIL 480ML (MISCELLANEOUS) IMPLANT
GLOVE BIOGEL M 7.0 STRL (GLOVE) ×8 IMPLANT
GLOVE BIOGEL PI IND STRL 7.0 (GLOVE) ×4 IMPLANT
GLOVE BIOGEL PI IND STRL 9 (GLOVE) ×4 IMPLANT
GLOVE BIOGEL PI INDICATOR 7.0 (GLOVE) ×4
GLOVE BIOGEL PI INDICATOR 9 (GLOVE) ×4
GLOVE ECLIPSE 9.0 STRL (GLOVE) ×12 IMPLANT
GLOVE EXAM NITRILE MD LF STRL (GLOVE) ×4 IMPLANT
GOWN SPEC L3 XXLG W/TWL (GOWN DISPOSABLE) ×4 IMPLANT
GOWN STRL REUS W/TWL LRG LVL3 (GOWN DISPOSABLE) ×12 IMPLANT
INST SET MAJOR GENERAL (KITS) ×4 IMPLANT
KIT ROOM TURNOVER APOR (KITS) ×4 IMPLANT
MANIFOLD NEPTUNE II (INSTRUMENTS) ×4 IMPLANT
NEEDLE HYPO 18GX1.5 BLUNT FILL (NEEDLE) IMPLANT
NEEDLE HYPO 25X1 1.5 SAFETY (NEEDLE) ×4 IMPLANT
NS IRRIG 1000ML POUR BTL (IV SOLUTION) ×8 IMPLANT
PACK ABDOMINAL MAJOR (CUSTOM PROCEDURE TRAY) ×4 IMPLANT
RETRACTOR WND ALEXIS 25 LRG (MISCELLANEOUS) IMPLANT
RTRCTR WOUND ALEXIS 18CM MED (MISCELLANEOUS)
RTRCTR WOUND ALEXIS 25CM LRG (MISCELLANEOUS)
SET BASIN LINEN APH (SET/KITS/TRAYS/PACK) ×4 IMPLANT
SPONGE DRAIN TRACH 4X4 STRL 2S (GAUZE/BANDAGES/DRESSINGS) IMPLANT
SPONGE LAP 18X18 X RAY DECT (DISPOSABLE) IMPLANT
STRIP CLOSURE SKIN 1/2X4 (GAUZE/BANDAGES/DRESSINGS) ×6 IMPLANT
SUT CHROMIC 0 CT 1 (SUTURE) ×76 IMPLANT
SUT CHROMIC 2 0 CT 1 (SUTURE) ×4 IMPLANT
SUT CHROMIC GUT BROWN 0 54 (SUTURE) IMPLANT
SUT CHROMIC GUT BROWN 0 54IN (SUTURE)
SUT ETHILON 3 0 FSL (SUTURE) IMPLANT
SUT PDS AB CT VIOLET #0 27IN (SUTURE) IMPLANT
SUT PLAIN CT 1/2CIR 2-0 27IN (SUTURE) ×8 IMPLANT
SUT PROLENE 0 CT 1 30 (SUTURE) ×4 IMPLANT
SUT VIC AB 0 CT1 27 (SUTURE)
SUT VIC AB 0 CT1 27XBRD ANTBC (SUTURE) IMPLANT
SUT VIC AB 2-0 CT1 27 (SUTURE)
SUT VIC AB 2-0 CT1 TAPERPNT 27 (SUTURE) IMPLANT
SUT VICRYL 4 0 KS 27 (SUTURE) ×4 IMPLANT
SYR 20CC LL (SYRINGE) IMPLANT
SYR CONTROL 10ML LL (SYRINGE) ×4 IMPLANT
TOWEL BLUE STERILE X RAY DET (MISCELLANEOUS) ×4 IMPLANT
TRAY FOLEY CATH SILVER 16FR (SET/KITS/TRAYS/PACK) ×4 IMPLANT

## 2015-10-04 NOTE — Interval H&P Note (Signed)
History and Physical Interval Note:  10/04/2015 7:10 AM  [name]  has presented today for surgery, with the diagnosis of uterine fibroids  The various methods of treatment have been discussed with the patient and family. After consideration of risks, benefits and other options for treatment, the patient has consented to  Procedure(s): HYSTERECTOMY ABDOMINAL (N/A) BILATERAL SALPINGECTOMY (Bilateral) as a surgical intervention .  The patient's history has been reviewed, patient examined, no change in status, stable for surgery.  I have reviewed the patient's chart and labs.  Questions were answered to the patient's satisfaction.    Hgb 11.7. [pt with minimal bleeding recently, less than a pad/day. Took bowel prep. No GI , respiratory, or cardiac changes in overall status.  [name]

## 2015-10-04 NOTE — Anesthesia Postprocedure Evaluation (Signed)
Anesthesia Post Note  Patient: [name]  Procedure(s) Performed: Procedure(s) (LRB): HYSTERECTOMY ABDOMINAL (N/A) BILATERAL SALPINGECTOMY (Bilateral)  Patient location during evaluation: PACU Anesthesia Type: General Level of consciousness: awake and alert and oriented Pain control: still working on pain control, starting dilaudid. Vital Signs Assessment: post-procedure vital signs reviewed and stable Respiratory status: spontaneous breathing Cardiovascular status: blood pressure returned to baseline Postop Assessment: no signs of nausea or vomiting Anesthetic complications: no    Last Vitals:  Filed Vitals:   10/04/15 1100 10/04/15 1115  BP: 166/95 156/98  Pulse: 67 70  Temp:    Resp: 14 20    Last Pain:  Filed Vitals:   10/04/15 1127  PainSc: 5                  Tighe Gitto

## 2015-10-04 NOTE — Anesthesia Preprocedure Evaluation (Addendum)
Anesthesia Evaluation  Patient identified by MRN, date of birth, ID band  Reviewed: Allergy & Precautions, NPO status , Patient's Chart, lab work & pertinent test results  Airway Mallampati: III  TM Distance: >3 FB     Dental  (+) Teeth Intact, Dental Advisory Given   Pulmonary Current Smoker,    breath sounds clear to auscultation       Cardiovascular negative cardio ROS   Rhythm:Regular Rate:Normal     Neuro/Psych    GI/Hepatic   Endo/Other  diabetes (no meds), Type 2  Renal/GU      Musculoskeletal   Abdominal   Peds  Hematology   Anesthesia Other Findings   Reproductive/Obstetrics                            Anesthesia Physical Anesthesia Plan  ASA: II  Anesthesia Plan: General   Post-op Pain Management:    Induction: Intravenous  Airway Management Planned: Oral ETT  Additional Equipment:   Intra-op Plan:   Post-operative Plan: Extubation in OR  Informed Consent: I have reviewed the patients History and Physical, chart, labs and discussed the procedure including the risks, benefits and alternatives for the proposed anesthesia with the patient or authorized representative who has indicated his/her understanding and acceptance.     Plan Discussed with:   Anesthesia Plan Comments:         Anesthesia Quick Evaluation

## 2015-10-04 NOTE — Anesthesia Procedure Notes (Signed)
Procedure Name: Intubation Date/Time: 10/04/2015 7:41 AM Performed by: Tressie Stalker E Pre-anesthesia Checklist: Patient identified, Patient being monitored, Timeout performed, Emergency Drugs available and Suction available Patient Re-evaluated:Patient Re-evaluated prior to inductionOxygen Delivery Method: Circle System Utilized Preoxygenation: Pre-oxygenation with 100% oxygen Intubation Type: IV induction Ventilation: Mask ventilation without difficulty Laryngoscope Size: Mac and 3 Grade View: Grade II Tube type: Oral Tube size: 7.0 mm Number of attempts: 1 Airway Equipment and Method: Stylet Placement Confirmation: ETT inserted through vocal cords under direct vision,  positive ETCO2 and breath sounds checked- equal and bilateral Secured at: 21 cm Tube secured with: Tape Dental Injury: Teeth and Oropharynx as per pre-operative assessment

## 2015-10-04 NOTE — Brief Op Note (Signed)
10/04/2015  10:06 AM  PATIENT:  [name]  45 y.o. female  PRE-OPERATIVE DIAGNOSIS:  uterine fibroids 14-16 week size menorrhagia, anemia  POST-OPERATIVE DIAGNOSIS:  uterine fibroids 16 weeks, menorrhagia, anemia  PROCEDURE:  Procedure(s): HYSTERECTOMY ABDOMINAL (N/A) BILATERAL SALPINGECTOMY (Bilateral)  SURGEON:  Surgeon(s) and Role:    * Jonnie Kind, MD - Primary  PHYSICIAN ASSISTANT:   ASSISTANTS: Simonne Maffucci RNFA   ANESTHESIA:   local and general  EBL:  Total I/O In: 2200 [I.V.:2200] Out: 450 [Urine:200; Blood:250]  BLOOD ADMINISTERED:none  DRAINS: Urinary Catheter (Foley)   LOCAL MEDICATIONS USED:  MARCAINE    and Amount: 20 ml  SPECIMEN:  Source of Specimen:  Uterus, cervix bilateral fallopian tubes  DISPOSITION OF SPECIMEN:  PATHOLOGY  COUNTS:  YES  TOURNIQUET:  * No tourniquets in log *  DICTATION: .Dragon Dictation  PLAN OF CARE: Admit to inpatient   PATIENT DISPOSITION:  PACU - hemodynamically stable.   Delay start of Pharmacological VTE agent (>24hrs) due to surgical blood loss or risk of bleeding: not applicable

## 2015-10-04 NOTE — Progress Notes (Signed)
Day of Surgery Procedure(s) (LRB):day0 HYSTERECTOMY ABDOMINAL (N/A) BILATERAL SALPINGECTOMY (Bilateral)  Subjective: Patient reports nicotine craving.  Pt is not on BP meds or diabetic meds, but lists self as diabetic, also BP's high since surgery.   Objective: I have reviewed patient's vital signs, intake and output and medications. Temp:  [97.8 F (36.6 C)-98.9 F (37.2 C)] 97.8 F (36.6 C) (01/17 1642) Pulse Rate:  [64-80] 65 (01/17 1642) Resp:  [14-45] 22 (01/17 1642) BP: (137-171)/(71-105) 145/71 mmHg (01/17 1642) SpO2:  [98 %-100 %] 99 % (01/17 1642)  Intake/Output Summary (Last 24 hours) at 10/04/15 1811 Last data filed at 10/04/15 1500  Gross per 24 hour  Intake   2540 ml  Output    450 ml  Net   2090 ml    General: alert and no distress GI: soft, non-tender; bowel sounds normal; no masses,  no organomegaly and incision: intact and dressing 80% saturated but it is a honeycomb with minimal absorption capacity. no active bleeding noted. Vaginal Bleeding: none  Assessment: s/p Procedure(s): HYSTERECTOMY ABDOMINAL (N/A) BILATERAL SALPINGECTOMY (Bilateral): stable, not tolerating diet and mildly hypertensive, and need to monitor cbg's  Plan: Encourage ambulation add norvasc, and nicoderm   Check AIC in am, and cbg's qid while in hosp.  LOS: 0 days    [name] V 10/04/2015, 6:09 PM

## 2015-10-04 NOTE — Transfer of Care (Signed)
Immediate Anesthesia Transfer of Care Note  Patient: [name]  Procedure(s) Performed: Procedure(s): HYSTERECTOMY ABDOMINAL (N/A) BILATERAL SALPINGECTOMY (Bilateral)  Patient Location: PACU  Anesthesia Type:General  Level of Consciousness: awake and alert   Airway & Oxygen Therapy: Patient Spontanous Breathing and Patient connected to face mask oxygen  Post-op Assessment: Report given to RN  Post vital signs: Reviewed and stable  Last Vitals:  Filed Vitals:   10/04/15 0715 10/04/15 0730  BP: 148/90 148/83  Pulse:    Temp:    Resp: 45 24    Complications: No apparent anesthesia complications

## 2015-10-04 NOTE — Op Note (Signed)
10/04/2015  10:06 AM  PATIENT:  [name]  45 y.o. female  PRE-OPERATIVE DIAGNOSIS:  uterine fibroids 14-16 week size menorrhagia, anemia  POST-OPERATIVE DIAGNOSIS:  uterine fibroids 16 weeks, menorrhagia, anemia  PROCEDURE:  Procedure(s): HYSTERECTOMY ABDOMINAL (N/A) BILATERAL SALPINGECTOMY (Bilateral)  SURGEON:  Surgeon(s) and Role:    * Jonnie Kind, MD - Primary  PHYSICIAN ASSISTANT:   ASSISTANTS: Simonne Maffucci RNFA   ANESTHESIA:   local and general  EBL:  Total I/O In: 2200 [I.V.:2200] Out: 450 [Urine:200; Blood:250]  BLOOD ADMINISTERED:none  DRAINS: Urinary Catheter (Foley)   LOCAL MEDICATIONS USED:  MARCAINE    and Amount: 20 ml  SPECIMEN:  Source of Specimen:  Uterus, cervix bilateral fallopian tubes  DISPOSITION OF SPECIMEN:  PATHOLOGY  COUNTS:  YES  TOURNIQUET:  * No tourniquets in log *  DICTATION: .Dragon Dictation  PLAN OF CARE: Admit to inpatient   PATIENT DISPOSITION:  PACU - hemodynamically stable.   Delay start of Pharmacological VTE agent (>24hrs) due to surgical blood loss or risk of bleeding: not applicable Indications 45 year old female with a known uterine fibroids 14-16 week size with significant anemia due to the irregular bleeding. Endometrial biopsy and Pap smears were normal. Patient had been suppressed with Megace until hemoglobin and hematocrit  were improved from its low of 6.9  Details of procedure. Patient was taken operating room prepped and draped for lower abdominal surgery. Type and cross was activated prior to procedure as precaution with the patient asleep abdominal palpation showed that the fibroid uterus reached at least 16 weeks size so decision was made with a midline vertical incision. A 20 centimeter incision was performed. With careful entry of the abdominal cavity. Large fibroid uterus was mobile. The left ovary was normal with some adhesions to the tubal area as well as to epiploic fat from the rectosigmoid and  lysis of adhesions freed up the anatomy. Bowel was packed away with moistened laparotomy tapes and a moistened laparotomy towel, and attention to the uterus was begun by taking down the round ligaments. Beginning on the left side. Round ligament was taken down doubly ligated and transected. The left utero-ovarian ligament and adjacent vessels were isolated doubly clamped with Kelly clamps, transected and ligated bladder flap was developed anteriorly. The left mesosalpinx and broad ligament were dissected down such that the left uterine vessels could be accessed. The left uterine vessels doubly clamped along the lower uterine segment with curved Heaney clamps with Kelly clamp place for backbleeding. Transection and double suture ligature on the left uterine vessels was performed using 0 chromic suture. The uterine clamping was taken off after suturing to control backbleeding with figure-of-eight sutures of 0 chromic straight Heaney clamp was used to cross clamp the vessels along the upper lower uterine segment, with sharp dissection and 0 chromic suture ligature. The right side was then treated in a similar fashion. At this time malleable retractor could be placed behind the uterus to protect the bowel, laparotomy tape position in place and the uterus amputated off the lower uterine segment. This allowed for dramatic improvement visibility and access to the pelvic structures. Coker clamps were placed on the upper aspects of the uterine stump, and the lower uterine segment clamped cut and suture ligated on each side using straight Heaney clamps knife dissection and 0 chromic suture ligature. Uterosacral ligament ligaments could be identified. The anterior lip of the cervix was quite large and descended well into the vagina. A right angle clamp was placed through  the endocervical canal to help manage identify the anterior cervicovaginal fornix. The cervix was amputated off of the cuff using heavy Mayo scissors, with a  small remnant of the cervix left in place initially. This was trimmed out using Mayo scissors. Aldridge stitches were placed at each lateral vaginal angle to incorporate the lateral vaginal cuff into the lower cardinal ligament laterally. The cuff was closed front to back using a series of figure-of-eight sutures of 0 chromic. This was copiously irrigated pedicles were inspected for hemostasis. There was one small area of bleeding on the backside of the bladder dome were a superficial horizontal mattress suture to achieve hemostasis. This was inspected and considered hemostatic. bilateral salpingectomy was then performed by dissecting the adhesions freed from the ovary and then crossclamping the mesosalpinx beneath the tube, ligating was 2-0 chromic and amputating off the tube and fimbria hemostasis was confirmed. Pedicles were inspected and confirmed as hemostatic. Pelvis was inspected and considered hemostatic. Irrigated 1 more time. Equipment was removed and sponge counts were correct. The anterior peritoneum was closed using running 2-0 chromic. The fascial layer was closed with continuous running 0 PDS. Subcutaneous test tissues were irrigated confirmed as hemostatic, reapproximated with interrupted 20 plain suture and then subcuticular 4-0 Vicryl closure of the skin completed the procedure Steri-Strips and benzoin were applied. Patient to recovery room in stable condition with 250 cc EBL and 200 cc urine output.

## 2015-10-05 ENCOUNTER — Telehealth: Payer: Self-pay | Admitting: *Deleted

## 2015-10-05 ENCOUNTER — Encounter (HOSPITAL_COMMUNITY): Payer: Self-pay | Admitting: Obstetrics and Gynecology

## 2015-10-05 ENCOUNTER — Inpatient Hospital Stay (HOSPITAL_COMMUNITY): Payer: BLUE CROSS/BLUE SHIELD

## 2015-10-05 ENCOUNTER — Encounter: Payer: BLUE CROSS/BLUE SHIELD | Admitting: Obstetrics and Gynecology

## 2015-10-05 DIAGNOSIS — R0789 Other chest pain: Secondary | ICD-10-CM

## 2015-10-05 DIAGNOSIS — Z90710 Acquired absence of both cervix and uterus: Secondary | ICD-10-CM

## 2015-10-05 DIAGNOSIS — F411 Generalized anxiety disorder: Secondary | ICD-10-CM

## 2015-10-05 LAB — TROPONIN I: Troponin I: <0.03 ng/mL (ref <0.031)

## 2015-10-05 LAB — GLUCOSE, CAPILLARY
Glucose-Capillary: 161 mg/dL — ABNORMAL HIGH (ref 65–99)
Glucose-Capillary: 133 mg/dL — ABNORMAL HIGH (ref 65–99)
Glucose-Capillary: 154 mg/dL — ABNORMAL HIGH (ref 65–99)
Glucose-Capillary: 113 mg/dL — ABNORMAL HIGH (ref 65–99)
Glucose-Capillary: 125 mg/dL — ABNORMAL HIGH (ref 65–99)

## 2015-10-05 LAB — BASIC METABOLIC PANEL
Anion gap: 8 (ref 5–15)
BUN: 11 mg/dL (ref 6–20)
CO2: 25 mmol/L (ref 22–32)
Calcium: 8.6 mg/dL — ABNORMAL LOW (ref 8.9–10.3)
Chloride: 104 mmol/L (ref 101–111)
Creatinine, Ser: 0.46 mg/dL (ref 0.44–1.00)
GFR calc Af Amer: >60 mL/min (ref >60)
GLUCOSE: 123 mg/dL — AB (ref 65–99)
POTASSIUM: 3.6 mmol/L (ref 3.5–5.1)
Sodium: 137 mmol/L (ref 135–145)

## 2015-10-05 LAB — CBC
HCT: 32 % — ABNORMAL LOW (ref 36.0–46.0)
Hemoglobin: 10.4 g/dL — ABNORMAL LOW (ref 12.0–15.0)
MCH: 24.6 pg — AB (ref 26.0–34.0)
MCHC: 32.5 g/dL (ref 30.0–36.0)
MCV: 75.8 fL — AB (ref 78.0–100.0)
PLATELETS: 244 10*3/uL (ref 150–400)
RBC: 4.22 MIL/uL (ref 3.87–5.11)
RDW: 26.8 % — AB (ref 11.5–15.5)
WBC: 10.7 10*3/uL — ABNORMAL HIGH (ref 4.0–10.5)

## 2015-10-05 MED ORDER — PANTOPRAZOLE SODIUM 40 MG IV SOLR
40.0000 mg | Freq: Two times a day (BID) | INTRAVENOUS | Status: DC
  Administered 2015-10-05 – 2015-10-06 (×2): 40 mg via INTRAVENOUS
  Filled 2015-10-05 (×2): qty 40

## 2015-10-05 MED ORDER — ENOXAPARIN SODIUM 40 MG/0.4ML ~~LOC~~ SOLN
40.0000 mg | SUBCUTANEOUS | Status: DC
  Administered 2015-10-05 – 2015-10-06 (×2): 40 mg via SUBCUTANEOUS
  Filled 2015-10-05 (×2): qty 0.4

## 2015-10-05 MED ORDER — ASPIRIN 325 MG PO TABS
325.0000 mg | ORAL_TABLET | Freq: Every day | ORAL | Status: DC
  Administered 2015-10-05 – 2015-10-06 (×2): 325 mg via ORAL
  Filled 2015-10-05 (×2): qty 1

## 2015-10-05 MED ORDER — GI COCKTAIL ~~LOC~~: 30.0000 mL | Freq: Three times a day (TID) | ORAL | Status: DC | PRN

## 2015-10-05 MED ORDER — IOHEXOL 350 MG/ML SOLN
100.0000 mL | Freq: Once | INTRAVENOUS | Status: AC | PRN
  Administered 2015-10-05: 100 mL via INTRAVENOUS

## 2015-10-05 NOTE — Anesthesia Postprocedure Evaluation (Signed)
Anesthesia Post Note  Patient: [name]  Procedure(s) Performed: Procedure(s) (LRB): HYSTERECTOMY ABDOMINAL (N/A) BILATERAL SALPINGECTOMY (Bilateral)  Patient location during evaluation: Nursing Unit Anesthesia Type: General Level of consciousness: awake and alert and patient cooperative Pain management: pain level controlled Vital Signs Assessment: post-procedure vital signs reviewed and stable Respiratory status: spontaneous breathing, nonlabored ventilation and patient connected to nasal cannula oxygen Cardiovascular status: blood pressure returned to baseline and stable Postop Assessment: no signs of nausea or vomiting and adequate PO intake Anesthetic complications: no    Last Vitals:  Filed Vitals:   10/05/15 1029 10/05/15 1200  BP: 169/79   Pulse:    Temp: 36.9 C   Resp: 20 14    Last Pain:  Filed Vitals:   10/05/15 1231  PainSc: 7                  [name] J

## 2015-10-05 NOTE — Addendum Note (Signed)
Addendum  created 10/05/15 1351 by Charmaine Downs, CRNA   Modules edited: Clinical Notes   Clinical Notes:  File: [id number]

## 2015-10-05 NOTE — Consult Note (Signed)
[facility] History and Physical  [name] Toor [id number] DOB: 1971/07/10    PCP:   No PCP Per Patient   Physician requesting consult:  Dr Glo Herring of OB-GYN, Reason for medical consultation:  Chest pain.   HPI: [name] is an 45 y.o. female with hx of sickle cell trait, DM, anemia, fibroid, s/p hysterectomy POD 1, woke up with retrosternal CP, non radiating, and having mild SOB.  Pain was not pleuritic.  She has some relief with antacid, but pain persisted.  EKG showed no acute ST T changes, and serology was remarkable for Hb of 10 g per dL, and normal renal Fx tests.  She denied black or bloody stool, nausea, vomiting, diaphoresis, fever or chills.  She has no prior hx of PUD, and no hx of prior PE.  Hospitalist was asked to evaluate her for the chest pain.   Rewiew of Systems:  Constitutional: Negative for malaise, fever and chills. No significant weight loss or weight gain Eyes: Negative for eye pain, redness and discharge, diplopia, visual changes, or flashes of light. ENMT: Negative for ear pain, hoarseness, nasal congestion, sinus pressure and sore throat. No headaches; tinnitus, drooling, or problem swallowing. Cardiovascular: Negative for  palpitations, diaphoresis, and peripheral edema. ; No orthopnea, PND Respiratory: Negative for cough, hemoptysis, wheezing and stridor. No pleuritic chestpain. Gastrointestinal: Negative for nausea, vomiting, diarrhea, constipation, abdominal pain, melena, blood in stool, hematemesis, jaundice and rectal bleeding.    Genitourinary: Negative for frequency, dysuria, incontinence,flank pain and hematuria; Musculoskeletal: Negative for back pain and neck pain. Negative for swelling and trauma.;  Skin: . Negative for pruritus, rash, abrasions, bruising and skin lesion.; ulcerations Neuro: Negative for headache, lightheadedness and neck stiffness. Negative for weakness, altered level of consciousness , altered mental status, extremity  weakness, burning feet, involuntary movement, seizure and syncope.  Psych: negative for depression, insomnia, tearfulness, panic attacks, hallucinations, paranoia, suicidal or homicidal ideation  Past Medical History  Diagnosis Date  . Anemia   . Sickle cell trait ([place])   . Diabetes mellitus without complication ([place])   . Fibroids     Past Surgical History  Procedure Laterality Date  . Tubal ligation    . Abdominal hysterectomy N/A 10/04/2015    Procedure: HYSTERECTOMY ABDOMINAL;  Surgeon: Jonnie Kind, MD;  Location: [facility];  Service: Gynecology;  Laterality: N/A;  . Bilateral salpingectomy Bilateral 10/04/2015    Procedure: BILATERAL SALPINGECTOMY;  Surgeon: Jonnie Kind, MD;  Location: [facility];  Service: Gynecology;  Laterality: Bilateral;    Medications:  HOME MEDS: Prior to Admission medications   Medication Sig Start Date End Date Taking? Authorizing Provider  megestrol (MEGACE) 40 MG tablet Take 1 tablet (40 mg total) by mouth 3 (three) times daily. 08/09/15  Yes Jonnie Kind, MD  ferumoxytol [facility]) 510 MG/17ML SOLN injection Inject 17 mLs (510 mg total) into the vein once. Patient not taking: Reported on 09/21/2015 08/22/15   Jonnie Kind, MD  leuprolide (LUPRON) 3.75 MG injection Inject 3.75 mg into the muscle once. Patient not taking: Reported on 09/21/2015 09/06/15   Jonnie Kind, MD     Allergies:  No Known Allergies  Social History:   reports that she has been smoking Cigarettes.  She has a 28 pack-year smoking history. She has never used smokeless tobacco. She reports that she drinks alcohol. She reports that she does not use illicit drugs.  Family History: Family History  Problem Relation Age of Onset  . Diabetes Father  Physical Exam: Filed Vitals:   10/05/15 0223 10/05/15 0400 10/05/15 0601 10/05/15 0800  BP: 149/74  137/79   Pulse: 65  71   Temp: 98 F (36.7 C)  98.1 F (36.7 C)   TempSrc: Oral  Oral   Resp: 15 18 17 20   SpO2:  100% 96% 100% 100%   Blood pressure 137/79, pulse 71, temperature 98.1 F (36.7 C), temperature source Oral, resp. rate 20, SpO2 100 %.  GEN:  Pleasant  patient lying in the stretcher in no acute distress; cooperative with exam. PSYCH:  alert and oriented x4; does not appear anxious or depressed; affect is appropriate. HEENT: Mucous membranes pink and anicteric; PERRLA; EOM intact; no cervical lymphadenopathy nor thyromegaly or carotid bruit; no JVD; There were no stridor. Neck is very supple. Breasts:: Not examined CHEST WALL: No tenderness CHEST: Normal respiration, clear to auscultation bilaterally.  HEART: Regular rate and rhythm.  There is a 2/6 SEM at the [place]. With no rub, or gallops.   BACK: No kyphosis or scoliosis; no CVA tenderness ABDOMEN: soft and non-tender; no masses, no organomegaly, normal abdominal bowel sounds; no pannus; no intertriginous candida. There is no rebound and no distention. Rectal Exam: Not done EXTREMITIES: No bone or joint deformity; age-appropriate arthropathy of the hands and knees; no edema; no ulcerations.  There is no calf tenderness. Genitalia: not examined PULSES: 2+ and symmetric SKIN: Normal hydration no rash or ulceration CNS: Cranial nerves 2-12 grossly intact no focal lateralizing neurologic deficit.  Speech is fluent; uvula elevated with phonation, facial symmetry and tongue midline. DTR are normal bilaterally, cerebella exam is intact, barbinski is negative and strengths are equaled bilaterally.  No sensory loss.   Labs on Admission:  Basic Metabolic Panel:  Recent Labs Lab 10/05/15 0634  NA 137  K 3.6  CL 104  CO2 25  GLUCOSE 123*  BUN 11  CREATININE 0.46  CALCIUM 8.6*   CBC:  Recent Labs Lab 10/05/15 0634  WBC 10.7*  HGB 10.4*  HCT 32.0*  MCV 75.8*  PLT 244    CBG:  Recent Labs Lab 10/04/15 0704 10/04/15 1008 10/04/15 1646 10/04/15 2146 10/05/15 0805  GLUCAP 107* 161* 136* 134* 133*   EKG: Independently  reviewed.  Assessment/Plan   . S/P total abdominal hysterectomy   Chest pain   Anxiety   HTN  PLAN:    Atypical CP:  I think we need to exclude a PE, given that she has been on Megace, and had surgery, though without pleuritic component to her CP.  Her renal fx will allow [place] to proceed with a CTPA.  I don't think she has an acute coronary syndrome, but will cycle her troponins. Her EKG is normal, and will obtain an ECHO for her heart murmur.  I would like to give her an ASA, along with GI cocktails and PPI.  Will start her on DVT prophylaxis and d/c SCD.  I will follow with you and Thank you, Dr Glo Herring for allowing me to participate in the care of your patient.  Good Day.   Other plans as per orders. Code Status: FULL Haskel Khan, MD. FACP [facility] Pager [phone] 7pm to 7am.  10/05/2015, 9:07 AM

## 2015-10-05 NOTE — Care Management Note (Signed)
Case Management Note  Patient Details  Name: [name] MRN: [id number] Date of Birth: [birth year]  Subjective/Objective:                  Pt s/p hysterectomy. Pt is from home and ind with ADL's. Pt plans to return home with self care.   Action/Plan: No CM needs at DC.   Expected Discharge Date:  10/06/15               Expected Discharge Plan:  Home/Self Care  In-House Referral:  NA  Discharge planning Services  CM Consult  Post Acute Care Choice:  NA Choice offered to:  NA  DME Arranged:    DME Agency:     HH Arranged:    HH Agency:     Status of Service:  Completed, signed off  Medicare Important Message Given:    Date Medicare IM Given:    Medicare IM give by:    Date Additional Medicare IM Given:    Additional Medicare Important Message give by:     If discussed at [place] of Stay Meetings, dates discussed:    Additional Comments:  Sherald Barge, RN 10/05/2015, 3:29 PM

## 2015-10-05 NOTE — Progress Notes (Signed)
1 Day Post-Op Procedure(s) (LRB): HYSTERECTOMY ABDOMINAL (N/A) BILATERAL SALPINGECTOMY (Bilateral)  Subjective: Patient reports incisional pain.  Rated as a 3.  Pt woke this a.m at 4:30 with a sense of chest tightness , initially described to RN as relieved by Mylanta, but on further questioning this am she describes it as persisting , but better, without radiation.  EKG obtained this am. Shows NSR, with pulse 70's with "cannot rule out anterior infarct, age undetermined."   Objective: I have reviewed patient's vital signs, intake and output, medications and labs. BP 137/79 mmHg  Pulse 71  Temp(Src) 98.1 F (36.7 C) (Oral)  Resp 17  SpO2 100%  General: alert, cooperative, no distress and moderately obese Resp: unlabored breathing,  GI: soft, non-tender; bowel sounds normal; no masses,  no organomegaly and incision: honeycomb dressing 90% saturated, no hematoma, will change dressing Vaginal Bleeding: none CBC Latest Ref Rng 10/05/2015 09/23/2015 09/21/2015  WBC 4.0 - 10.5 K/uL 10.7(H) 8.7 -  Hemoglobin 12.0 - 15.0 g/dL 10.4(L) 11.7(L) 11.1(A)  Hematocrit 36.0 - 46.0 % 32.0(L) 36.9 -  Platelets 150 - 400 K/uL 244 356 -    BMET    Component Value Date/Time   NA 137 10/05/2015 0634   K 3.6 10/05/2015 0634   CL 104 10/05/2015 0634   CO2 25 10/05/2015 0634   GLUCOSE 123* 10/05/2015 0634   BUN 11 10/05/2015 0634   CREATININE 0.46 10/05/2015 0634   CALCIUM 8.6* 10/05/2015 0634   GFRNONAA >60 10/05/2015 0634   GFRAA >60 10/05/2015 [id number]     Assessment: s/p Procedure(s): HYSTERECTOMY ABDOMINAL (N/A) BILATERAL SALPINGECTOMY (Bilateral): stable Chest discomfort, mild , with  Incomplete relief with mylanta EKG abnormal Plan: D/c foley Consult hospitalist re : chest discomfort.Dr Marin Comment contacted.  LOS: 1 day    Sanjuanita Condrey V 10/05/2015, 8:36 AM

## 2015-10-05 NOTE — Telephone Encounter (Signed)
Pt requesting a work note for Husband Gordy Levan) and Son [facility] Moroz) for yesterday and today. Pt had hysterectomy on 10/04/2015.

## 2015-10-06 LAB — GLUCOSE, CAPILLARY
GLUCOSE-CAPILLARY: 109 mg/dL — AB (ref 65–99)
GLUCOSE-CAPILLARY: 108 mg/dL — AB (ref 65–99)

## 2015-10-06 LAB — HEMOGLOBIN A1C
Hgb A1c MFr Bld: 5.3 % (ref 4.8–5.6)
Mean Plasma Glucose: 105 mg/dL

## 2015-10-06 MED ORDER — NICOTINE 21 MG/24HR TD PT24: 21.0000 mg | MEDICATED_PATCH | Freq: Every day | TRANSDERMAL | Status: DC

## 2015-10-06 MED ORDER — ONDANSETRON HCL 4 MG PO TABS: 4.0000 mg | ORAL_TABLET | Freq: Four times a day (QID) | ORAL | Status: DC | PRN

## 2015-10-06 MED ORDER — OXYCODONE-ACETAMINOPHEN 5-325 MG PO TABS: 1.0000 | ORAL_TABLET | ORAL | Status: DC | PRN

## 2015-10-06 MED ORDER — BISACODYL 10 MG RE SUPP
10.0000 mg | Freq: Once | RECTAL | Status: AC
  Administered 2015-10-06: 10 mg via RECTAL
  Filled 2015-10-06: qty 1

## 2015-10-06 MED ORDER — AMLODIPINE BESYLATE 10 MG PO TABS: 10.0000 mg | ORAL_TABLET | Freq: Every day | ORAL | Status: DC

## 2015-10-06 NOTE — Discharge Summary (Signed)
Physician Discharge Summary  Patient ID: [name] MRN: [id number] DOB/AGE: 10-10-1970 45 y.o.  Admit date: 10/04/2015 Discharge date: 10/06/2015  Admission Diagnoses: menorrhagia, anemia, uterine fibroids 14-16 wks  Discharge Diagnoses:  Menorrhagia, anemia, uterine fibroids 16 wk (750 gm) Active Problems:   S/P total abdominal hysterectomy s/p bilateral Salpingectomy Atypical Chest Pain, not cardiac in origin  Discharged Condition: stable  Hospital Course:  Patient Information    Patient Name Sex DOB SSN   [name], [name] Female [birth year] [id number]    H&P by Jonnie Kind, MD at 09/26/2015 6:53 PM    Author: Jonnie Kind, MD Service: Obstetrics/Gynecology Author Type: Physician   Filed: 09/26/2015 6:53 PM Note Time: 09/26/2015 6:53 PM Status: Signed   Editor: Jonnie Kind, MD (Physician)     Expand All Collapse All    Expand All Collapse All  Patient ID: [name], female DOB: [birth year], 45 y.o. MRN: [id number]  Preoperative History and Physical  [name] is a 45 y.o. No obstetric history on file. here for surgical management of uterine fibroids, anemia, DUB due to fibroids.. No significant preoperative concerns.  Proposed surgery : total Abdominal Hysterectomy , bilateral salpingectomy,  Past Medical History  Diagnosis Date  . Hypertension   . Anemia   . Sickle cell trait ([place])   . Diabetes mellitus without complication ([place])   . Fibroids    Past Surgical History  Procedure Laterality Date  . Tubal ligation     OB History  No data available  Patient denies any other pertinent gynecologic issues.   Current Outpatient Prescriptions on File Prior to Visit  Medication Sig Dispense Refill  . megestrol (MEGACE) 40 MG tablet Take 1 tablet (40 mg total) by mouth 3 (three) times daily. 45 tablet 2  . ferumoxytol (FERAHEME) 510 MG/17ML SOLN  injection Inject 17 mLs (510 mg total) into the vein once. (Patient not taking: Reported on 09/21/2015) 15.08 mL 0  . leuprolide (LUPRON) 3.75 MG injection Inject 3.75 mg into the muscle once. (Patient not taking: Reported on 09/21/2015) 1 kit 1   No current facility-administered medications on file prior to visit.   No Known Allergies  Social History: reports that she has been smoking Cigarettes. She has a 28 pack-year smoking history. She has never used smokeless tobacco. She reports that she does not drink alcohol or use illicit drugs.  Family History  Problem Relation Age of Onset  . Diabetes Father     Review of Systems: Noncontributory  PHYSICAL EXAM: Blood pressure 156/100, height _0  (1.6 m), weight 216 lb (97.977 kg). General appearance - alert, well appearing, and in no distress Chest - clear to auscultation, no wheezes, rales or rhonchi, symmetric air entry Heart - normal rate and regular rhythm Abdomen - soft, nontender, nondistended, no masses or organomegaly Pelvic well supported cervix and uterus, with uterine size 14-16 wk. Extremities - peripheral pulses normal, no pedal edema, no clubbing or cyanosis  Labs: Results for orders placed or performed in visit on 09/21/15 (from the past 336 hour(s))  POCT hemoglobin   Collection Time: 09/21/15 2:44 PM  Result Value Ref Range   Hemoglobin 11.1 (A) 12.2 - 16.2 g/dL   Hemoglobin   Endometrial biopsy: benign secretory, 06/2015   Imaging Studies:  Imaging Results    No results found.    Assessment:  Patient Active Problem List   Diagnosis Date Noted  . Excessive or frequent menstruation 07/20/2013  . Dysmenorrhea 07/20/2013  .  Fibroid, uterine 06/17/2013  . OBESITY 01/07/2008  . NICOTINE ADDICTION 01/07/2008    Plan: Patient will undergo surgical management with abdominal hysterectomy, with removal of  cervix and fallopian tubes.  Ovaries to be preserved .     .mec 09/21/2015 4:03 PM                  Consults: hospitalist service on postop day 1 due to atypical chest pain interpreted as tightness, and not responding to antacids. Troponins were negative, and CT negative for PE.  Pt remained stable from cardio vasc status thereafter. She was placed on Lovenox prophylaxis at 24 hr postop by Hospitalist, remained stable, and was d/c'd on pod 2 in stable contition  Significant Diagnostic Studies: labs:  CBC Latest Ref Rng 10/05/2015 09/23/2015 09/21/2015  WBC 4.0 - 10.5 K/uL 10.7(H) 8.7 -  Hemoglobin 12.0 - 15.0 g/dL 10.4(L) 11.7(L) 11.1(A)  Hematocrit 36.0 - 46.0 % 32.0(L) 36.9 -  Platelets 150 - 400 K/uL 244 356 -      Treatments: surgery: abdominal hysterectomy bilateral salpingectomy  Discharge Exam: Blood pressure 155/83, pulse 72, temperature 98.3 F (36.8 C), temperature source Oral, resp. rate 20, height _0  (1.6 m), weight 216 lb (97.977 kg), SpO2 100 %. General appearance: alert, cooperative and no distress Resp: clear to auscultation bilaterally Chest wall: no tenderness GI: soft, non-tender; bowel sounds normal; no masses,  no organomegaly  Disposition: 01-Home or Self Care  Discharge Instructions    Call MD for:  persistant nausea and vomiting    Complete by:  As directed      Call MD for:  temperature >100.4    Complete by:  As directed      Diet - low sodium heart healthy    Complete by:  As directed      Increase activity slowly    Complete by:  As directed             Medication List    STOP taking these medications        ferumoxytol 510 MG/17ML Soln injection  Commonly known as:  FERAHEME     leuprolide 3.75 MG injection  Commonly known as:  LUPRON     megestrol 40 MG tablet  Commonly known as:  MEGACE      TAKE these medications        amLODipine 10 MG tablet  Commonly known as:  NORVASC  Take 1 tablet (10 mg total) by mouth  daily.     nicotine 21 mg/24hr patch  Commonly known as:  NICODERM CQ - dosed in mg/24 hours  Place 1 patch (21 mg total) onto the skin daily.     ondansetron 4 MG tablet  Commonly known as:  ZOFRAN  Take 1 tablet (4 mg total) by mouth every 6 (six) hours as needed for nausea.     oxyCODONE-acetaminophen 5-325 MG tablet  Commonly known as:  PERCOCET/ROXICET  Take 1-2 tablets by mouth every 4 (four) hours as needed for severe pain (moderate to severe pain (when tolerating fluids)).           Follow-up Information    Follow up with Jonnie Kind, MD In 1 week.   Specialties:  Obstetrics and Gynecology, Radiology   Why:  For wound re-check, Postoperative visit   Contact information:   [place] [address] [phone]       Signed: Jonnie Kind 10/06/2015, 10:59 AM

## 2015-10-06 NOTE — Discharge Instructions (Signed)
Abdominal Hysterectomy, Care After °Refer to this sheet in the next few weeks. These instructions provide you with information on caring for yourself after your procedure. Your health care provider may also give you more specific instructions. Your treatment has been planned according to current medical practices, but problems sometimes occur. Call your health care provider if you have any problems or questions after your procedure.  °WHAT TO EXPECT AFTER THE PROCEDURE °After your procedure, it is typical to have the following: °· Pain. °· Feeling tired. °· Poor appetite. °· Less interest in sex. °It takes 4-6 weeks to recover from this surgery.  °HOME CARE INSTRUCTIONS  °· Take pain medicines only as directed by your health care provider. Do not take over-the-counter pain medicines without checking with your health care provider first.  °· Change your bandage as directed by your health care provider. °· Return to your health care provider to have your sutures taken out. °· Take showers instead of baths for 2-3 weeks. Ask your health care provider when it is safe to start showering.  °· Do not douche, use tampons, or have sexual intercourse for at least 6 weeks or until your health care provider says you can.   °· Follow your health care provider's advice about exercise, lifting, driving, and general activities. °· Get plenty of rest and sleep.   °· Do not lift anything heavier than a gallon of milk (about 10 lb [4.5 kg]) for the first month after surgery. °· You can resume your normal diet if your health care provider says it is okay.   °· Do not drink alcohol until your health care provider says you can.   °· If you are constipated, ask your health care provider if you can take a mild laxative. °· Eating foods high in fiber may also help with constipation. Eat plenty of raw fruits and vegetables, whole grains, and beans. °· Drink enough fluids to keep your urine clear or pale yellow.   °· Try to have someone at  home with you for the first 1-2 weeks to help around the house. °· Keep all follow-up appointments. °SEEK MEDICAL CARE IF:  °· You have chills or fever. °· You have swelling, redness, or pain in the area of your incision that is getting worse.   °· You have pus coming from the incision.   °· You notice a bad smell coming from the incision or bandage.   °· Your incision breaks open.   °· You feel dizzy or light-headed.   °· You have pain or bleeding when you urinate.   °· You have persistent diarrhea.   °· You have persistent nausea and vomiting.   °· You have abnormal vaginal discharge.   °· You have a rash.   °· You have any type of abnormal reaction or develop an allergy to your medicine.   °· Your pain medicine is not helping.   °SEEK IMMEDIATE MEDICAL CARE IF:  °· You have a fever and your symptoms suddenly get worse. °· You have severe abdominal pain. °· You have chest pain. °· You have shortness of breath. °· You faint. °· You have pain, swelling, or redness of your leg. °· You have heavy vaginal bleeding with blood clots. °MAKE SURE YOU: °· Understand these instructions. °· Will watch your condition. °· Will get help right away if you are not doing well or get worse. °  °This information is not intended to replace advice given to you by your health care provider. Make sure you discuss any questions you have with your health care provider. °  °Document   Released: 03/23/2005 Document Revised: 09/24/2014 Document Reviewed: 06/26/2013 °Elsevier Interactive Patient Education ©2016 [employer] Inc. ° °

## 2015-10-06 NOTE — Plan of Care (Signed)
Problem: Respiratory: Goal: Ability to maintain adequate ventilation will improve Outcome: Completed/Met Date Met:  10/06/15 97% on room air  Problem: Urinary Elimination: Goal: Will remain free from infection Outcome: Completed/Met Date Met:  10/06/15 Dressing changed to abdominal incision Incision is well approximated with steri strips and staples Pt tolerated dressing change without pain Pt instructed to wash hand to prevent infection

## 2015-10-06 NOTE — Progress Notes (Signed)
2 Days Post-Op Procedure(s) (LRB): HYSTERECTOMY ABDOMINAL (N/A) BILATERAL SALPINGECTOMY (Bilateral)  Subjective: Patient reports nausea, incisional pain and no problems voiding. Dr Gus Puma eval appreciated, pt stable for d/c home from his view Troponins negative CT A chest negative.   Objective: I have reviewed patient's vital signs, intake and output, labs and pathology.  General: alert, cooperative and no distress Resp: clear to auscultation bilaterally GI: normal findings: soft, non-tender and incision clean and abnormal findings:  hypoactive bowel sounds  Assessment: s/p Procedure(s): HYSTERECTOMY ABDOMINAL (N/A) BILATERAL SALPINGECTOMY (Bilateral): stable  Plan: Advance diet Encourage ambulation Discharge home dulcolax suppos  LOS: 2 days    [name] V 10/06/2015, 10:53 AM

## 2015-10-06 NOTE — Telephone Encounter (Signed)
Note given per request.

## 2015-10-06 NOTE — Progress Notes (Signed)
Discharge instructions read to patient and family All questions answered.  Pt discharged to home with family.

## 2015-10-07 LAB — TYPE AND SCREEN
ABO/RH(D): A POS
Antibody Screen: NEGATIVE
UNIT DIVISION: 0
UNIT DIVISION: 0

## 2015-10-10 ENCOUNTER — Other Ambulatory Visit: Payer: Self-pay | Admitting: Obstetrics and Gynecology

## 2015-10-10 ENCOUNTER — Telehealth: Payer: Self-pay | Admitting: Obstetrics and Gynecology

## 2015-10-10 DIAGNOSIS — G8918 Other acute postprocedural pain: Secondary | ICD-10-CM

## 2015-10-10 MED ORDER — OXYCODONE-ACETAMINOPHEN 5-325 MG PO TABS: 1.0000 | ORAL_TABLET | ORAL | Status: DC | PRN

## 2015-10-11 ENCOUNTER — Telehealth: Payer: Self-pay | Admitting: Obstetrics and Gynecology

## 2015-10-11 ENCOUNTER — Encounter: Payer: Self-pay | Admitting: Allergy and Immunology

## 2015-10-11 ENCOUNTER — Telehealth: Payer: Self-pay | Admitting: Obstetrics & Gynecology

## 2015-10-11 ENCOUNTER — Ambulatory Visit (INDEPENDENT_AMBULATORY_CARE_PROVIDER_SITE_OTHER): Payer: BLUE CROSS/BLUE SHIELD | Admitting: Allergy and Immunology

## 2015-10-11 VITALS — BP 136/76 | HR 74 | Temp 98.5°F | Resp 16 | Ht 66.06 in | Wt 210.2 lb

## 2015-10-11 DIAGNOSIS — R062 Wheezing: Secondary | ICD-10-CM

## 2015-10-11 DIAGNOSIS — J310 Chronic rhinitis: Secondary | ICD-10-CM

## 2015-10-11 DIAGNOSIS — R05 Cough: Secondary | ICD-10-CM

## 2015-10-11 DIAGNOSIS — R059 Cough, unspecified: Secondary | ICD-10-CM

## 2015-10-11 MED ORDER — HYDROCODONE-ACETAMINOPHEN 5-325 MG PO TABS: 1.0000 | ORAL_TABLET | Freq: Four times a day (QID) | ORAL | Status: DC | PRN

## 2015-10-11 NOTE — Telephone Encounter (Addendum)
Pt states that she is taking a stool softener, with some relief. Pt states that she is taking the fluid pill but she is having pain in her right foot. Pt states that the swelling has gone down. Pt states that the oxycodone is not helping at all. Pt states that Hydrocodone worked better and was given in the hospital.

## 2015-10-11 NOTE — Progress Notes (Signed)
NEW PATIENT NOTE  RE: [name] MRN: [id number] DOB: April 27, 1971 [facility] [address] [place], [place] [address] Date of Office Visit: 10/11/2015   Subjective:  [name] is a 45 y.o. female who presents today for Nasal Congestion; Wheezing; and Cough  Assessment:   1. History of Cough and wheeze in no respiratory distress, with clear lung exam, normal oxygenation and mild in office spirometry restriction.   (possible childhood asthma history).  2. Chronic tobacco use.  3. Chronic rhinitis, suspected allergic etiology.  4.      Complex medical history including diabetes, hypertension and recent hysterectomy for fibroids.  Plan:   Meds ordered this encounter  Medications  . levalbuterol (XOPENEX) nebulizer solution 1.25 mg    Sig:   . ipratropium (ATROVENT) nebulizer solution 0.5 mg    Sig:   . Albuterol Sulfate (PROAIR RESPICLICK) 123XX123 (90 Base) MCG/ACT AEPB    Sig: Inhale 2 puffs into the lungs every 4 (four) hours as needed (for cough, wheeze or shortness of breath.).    Dispense:  1 each    Refill:  1  . beclomethasone (QVAR) 80 MCG/ACT inhaler    Sig: Inhale 2 puffs into the lungs 2 (two) times daily.    Dispense:  1 Inhaler    Refill:  2   Patient Instructions  1. Avoidance: Mite, Mold and Pollen information for now and review in detail after testing completed. 2. Antihistamine: Begin Zyrtec 10mg  by mouth once daily for runny nose or itching.---STOP Claritin. 3. Nasal Spray: Nasacort 1-2 spray(s) each nostril once daily for stuffy nose or drainage.  4. Inhalers:  Rescue: ProAir respiclick 2 puffs every 4 hours as needed for cough or wheeze.       -May use 2 puffs 10-20 minutes prior to exercise.  Preventative: QVAR 61mcg 2puffs twice daily (Rinse, gargle, and spit out after use). 5. Nasal Saline wash each evening at shower time. 6.  Work towards decreasing cigarette use.  Choose quit date. 7.  Continue with fragrance free  soaps/lotions/detergents.  Consider trial of Vanicream moisturizer--monitor for any further skin concerns. 8. Follow up Visit: for skin testing off antihistamines 72 hours prior to appointment.  HPI: [name] presents to the office with a 2 year history of year round rhinorrhea, congestion, sneezing, itchy watery eyes, post nasal drip, cough, throat clearing and occasionally associated with wheeze.  There may be exercise induced symptoms and snoring.  She believes there was a childhood history of asthma but denies current chest congestion/tightness, shortness of breath, difficulty in breathing or episodes of bronchitis.  She reports dust, animal dander, outdoors, fluctuant weather patterns, strong odors and perfumes as provoking factors to her symptoms.  Denies any food sensitivities, sinus infections or GEreflux.  No respiratory related hospitalizations.   Recently used Allegra and Claritin of minimal benefit but benadryl and Nasacort have been helpful.  Denies ED or Urgent care visits, prednisone or antibiotic courses.  Medical History: Past Medical History  Diagnosis Date  . Anemia   . Sickle cell trait ([place])   . Diabetes mellitus without complication ([place])   . Fibroids    Surgical History: Past Surgical History  Procedure Laterality Date  . Tubal ligation    . Abdominal hysterectomy N/A 10/04/2015    Procedure: HYSTERECTOMY ABDOMINAL;  Surgeon: Jonnie Kind, MD;  Location: [facility];  Service: Gynecology;  Laterality: N/A;  . Bilateral salpingectomy Bilateral 10/04/2015    Procedure: BILATERAL SALPINGECTOMY;  Surgeon: Jonnie Kind, MD;  Location: [facility];  Service: Gynecology;  Laterality: Bilateral;   Family History: Family History  Problem Relation Age of Onset  . Diabetes Father   . Asthma Brother   . Allergic rhinitis Neg Hx   . Eczema Neg Hx   . Immunodeficiency Neg Hx   . Urticaria Neg Hx   . Hypertension Mother    Social History: Social History  . Marital Status: Married      Spouse Name: N/A  . Number of Children: 4  . Years of Education: N/A   Social History Main Topics  . Smoking status: Current Some Day Smoker -- 1.00 packs/day for 28 years    Types: Cigarettes  . Smokeless tobacco: Never Used  . Alcohol Use: Yes     Comment: occ.  . Drug Use: No  . 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  [name] has a current medication list which includes the following prescription(s): amlodipine, loratadine, ondansetron, oxycodone-acetaminophen and triamcinolone.   Drug Allergies: No Known Allergies  Environmental History: [name] lives in a > 49 year old house for 6 months with carpeted floors, central air and heat without humidifier or pets.  Stuffed mattress, feather pillow and non feather comforter.    Review of Systems  Constitutional: Negative for fever, weight loss and malaise/fatigue.  HENT: Positive for congestion (associated sinus pressure.). Negative for ear pain, hearing loss, nosebleeds and sore throat.   Eyes: Negative for blurred vision, discharge and redness.  Respiratory: Positive for cough. Negative for shortness of breath.        As indicated in HPI.  Denies history of bronchitis and pneumonia.  Cardiovascular: Negative for chest pain.  Gastrointestinal: Negative for heartburn, nausea, vomiting, abdominal pain, diarrhea and constipation.  Genitourinary: Negative.   Musculoskeletal: Negative for myalgias and joint pain.  Skin: Positive for itching (occasional as indicated in HPI.). Negative for rash.  Neurological: Negative.  Negative for dizziness, seizures, weakness and headaches.  Endo/Heme/Allergies: Positive for environmental allergies.       Denies sensitivity to aspirin, NSAIDs, stinging insects, foods, latex, jewelry and cosmetics.   Objective:   Filed Vitals:   10/11/15 0929 10/11/15 1016  BP:  152/78 136/76  Pulse: 74   Temp: 98.5 F (36.9 C)   Resp: 16    SpO2 Readings from Last 1 Encounters:  10/11/15 97%   Physical Exam  Constitutional: She is well-developed, well-nourished, and in no distress.  Alert interactive communicating easily in no acute distress.  HENT:  Head: Atraumatic.  Right Ear: Tympanic membrane and ear canal normal.  Left Ear: Tympanic membrane and ear canal normal.  Nose: Mucosal edema present. No rhinorrhea. No epistaxis.  Mouth/Throat: Oropharynx is clear and moist and mucous membranes are normal. No oropharyngeal exudate, posterior oropharyngeal edema or posterior oropharyngeal erythema.  Eyes: Conjunctivae are normal.  Neck: Neck supple.  Cardiovascular: Normal rate, S1 normal and S2 normal.   No murmur heard. Pulmonary/Chest: Effort normal. She has no wheezes. She has no rhonchi. She has no rales.  Post Xopenex/Atrovent: continues to be clear to ausculation without adventious breath sounds.  Abdominal: Soft. Normal appearance and bowel sounds are normal.  Musculoskeletal: She exhibits no edema.  Lymphadenopathy:    She has no cervical adenopathy.  Neurological: She is alert.  Skin: Skin is warm and intact. No rash noted.  No cyanosis. Nails show no clubbing.   Diagnostics: Spirometry:  FVC 2.28--79%,  FEV1 1.70--68%, postbronchodilator essentially no change. Skin testing:  Deferred per patient.    Meric Joye M. Ishmael Holter, MD   cc: [facility]

## 2015-10-12 NOTE — Telephone Encounter (Signed)
Pt has picked up stool softener, and had to relieve initial constipation by disimpacting self. Pt now having loose stools while taking milk of Magnesium .pt is afebrile. Pt to switch to colace ; appt Friday.

## 2015-10-13 MED ORDER — IPRATROPIUM BROMIDE 0.02 % IN SOLN: 0.5000 mg | Freq: Once | RESPIRATORY_TRACT | Status: DC

## 2015-10-13 MED ORDER — LEVALBUTEROL HCL 1.25 MG/3ML IN NEBU: 1.2500 mg | INHALATION_SOLUTION | Freq: Once | RESPIRATORY_TRACT | Status: DC

## 2015-10-13 NOTE — Patient Instructions (Addendum)
  Take Home Sheet  1. Avoidance: Mite, Mold and Pollen information for now and review in detail after testing completed.   2. Antihistamine: Begin Zyrtec 10mg  by mouth once daily for runny nose or itching.    ---STOP Claritin.   3. Nasal Spray: Nasacort 1-2 spray(s) each nostril once daily for stuffy nose or drainage.    4. Inhalers:  Rescue: ProAir respiclick 2 puffs every 4 hours as needed for cough or wheeze.       -May use 2 puffs 10-20 minutes prior to exercise.   Preventative: QVAR 1mcg 2puffs twice daily (Rinse, gargle, and spit out after use).   5. Nasal Saline wash each evening at shower time.  6.  Work towards decreasing cigarette use.  Choose quit date.  7.  Continue with fragrance free soaps/lotions/detergents.  Consider trial of Vanicream moisturizer.  8. Follow up Visit: for skin testing off antihistamines 72 hours prior to appointment.   Websites that have reliable Patient information: 1. American Academy of Asthma, Allergy, & Immunology: www.aaaai.org 2. Food Allergy Network: www.foodallergy.org 3. Mothers of Asthmatics: www.aanma.org 4. [place]: DiningCalendar.de 5. American College of Allergy, Asthma, & Immunology: https://robertson.info/ or www.acaai.org

## 2015-10-14 ENCOUNTER — Ambulatory Visit (INDEPENDENT_AMBULATORY_CARE_PROVIDER_SITE_OTHER): Payer: BLUE CROSS/BLUE SHIELD | Admitting: Obstetrics and Gynecology

## 2015-10-14 ENCOUNTER — Encounter: Payer: Self-pay | Admitting: Obstetrics and Gynecology

## 2015-10-14 VITALS — BP 142/90 | Ht 64.0 in | Wt 212.0 lb

## 2015-10-14 DIAGNOSIS — Z90710 Acquired absence of both cervix and uterus: Secondary | ICD-10-CM

## 2015-10-14 DIAGNOSIS — Z5189 Encounter for other specified aftercare: Secondary | ICD-10-CM

## 2015-10-14 MED ORDER — HYDROCODONE-ACETAMINOPHEN 5-325 MG PO TABS: 1.0000 | ORAL_TABLET | Freq: Four times a day (QID) | ORAL | Status: DC | PRN

## 2015-10-14 NOTE — Progress Notes (Signed)
Patient ID: [name], female   DOB: 26-May-1971, 45 y.o.   MRN: [id number]  Subjective:  [name] is a 45 y.o. female now 10 days status post total abdominal hysterectomy and bilateral salpingectomy. Associated Sx include difficulty sleeping because pt is unable to sleep on her stomach. Pt denies fever or any other Sx at this time.   As a secondary matter, pt complains of non-radiating, atraumatic, acute, intermittent right foot pain onset approximately one week ago.    Review of Systems Negative except difficulty sleeping due to inability to sleep on her stomach.    Diet:   No changes   Bowel movements : normal.  Pain is controlled with current analgesics. Medications being used: narcotic analgesics including hydrocodone .  Objective:  BP 142/90 mmHg  Ht 5\' 4"  (1.626 m)  Wt 212 lb (96.163 kg)  BMI 36.37 kg/m2  LMP 06/23/2015 General:Well developed, well nourished.  No acute distress. Abdomen: Bowel sounds normal, soft, non-tender. Midline scar, healing well. Steri strips replaced.   Incision(s):   Healing well, no drainage, no erythema, no hernia, no swelling, no dehiscence,  Assessment:  Post-Op 10 weeks s/p  total abdominal hysterectomy and bilateral salpingectomy   Doing well postoperatively.   Plan:  1.Wound care discussed   2. . current medications. Adding topical neosporin.  3. Activity restrictions: continue post op care 4. return to work: not applicable. 5. Follow up in 3 weeks. 6. Refill HC x1.   By signing my name below, I, Terressa Koyanagi, attest that this documentation has been prepared under the direction and in the presence of Mallory Shirk, MD. Electronically Signed: Terressa Koyanagi, ED Scribe. 10/14/2015. 1:02 PM.   I personally performed the services described in this documentation, which was SCRIBED in my presence. The recorded information has been reviewed and considered accurate. It has been edited as necessary during review. Jonnie Kind,  MD

## 2015-10-14 NOTE — Progress Notes (Signed)
Patient ID: [name], female   DOB: 1971/07/24, 45 y.o.   MRN: [id number] Pt here toady for post op visit. Pt states that the only problem she is having is that her right foot has a sharp pain every now and then.

## 2015-10-15 ENCOUNTER — Encounter: Payer: Self-pay | Admitting: Allergy and Immunology

## 2015-10-15 MED ORDER — BECLOMETHASONE DIPROPIONATE 80 MCG/ACT IN AERS: 2.0000 | INHALATION_SPRAY | Freq: Two times a day (BID) | RESPIRATORY_TRACT | Status: DC

## 2015-10-15 MED ORDER — ALBUTEROL SULFATE 108 (90 BASE) MCG/ACT IN AEPB: 2.0000 | INHALATION_SPRAY | RESPIRATORY_TRACT | Status: DC | PRN

## 2015-10-17 ENCOUNTER — Other Ambulatory Visit: Payer: Self-pay | Admitting: Obstetrics and Gynecology

## 2015-11-04 ENCOUNTER — Ambulatory Visit (INDEPENDENT_AMBULATORY_CARE_PROVIDER_SITE_OTHER): Payer: BLUE CROSS/BLUE SHIELD | Admitting: Obstetrics and Gynecology

## 2015-11-04 ENCOUNTER — Encounter: Payer: Self-pay | Admitting: Obstetrics and Gynecology

## 2015-11-04 VITALS — BP 150/98 | Ht 64.0 in | Wt 212.0 lb

## 2015-11-04 DIAGNOSIS — Z90710 Acquired absence of both cervix and uterus: Secondary | ICD-10-CM

## 2015-11-04 DIAGNOSIS — Z9889 Other specified postprocedural states: Secondary | ICD-10-CM

## 2015-11-04 NOTE — Progress Notes (Signed)
Patient ID: [name], female   DOB: 1971-06-02, 45 y.o.   MRN: [id number]  Subjective:  [name] is a 45 y.o. female now 5 weeks status post total abdominal hysterectomy and bilateral salpingectomy. Pt using hydrocodone 1x /day due to incision discomfort  And constipation Patient states she is doing well. She does note constipation. She denies any dyspareunia.  Review of Systems Negative except as noted above   Diet:   normal   Bowel movements : reduced; mild constipation  The patient is having no pain.  Objective:  BP 150/98 mmHg  Ht 5\' 4"  (1.626 m)  Wt 212 lb (96.163 kg)  BMI 36.37 kg/m2  LMP 06/23/2015 General:Well developed, well nourished.  No acute distress. Abdomen: Bowel sounds normal, soft, non-tender. Midline scar, healing well.   Incision(s):   Healing well, no drainage, no erythema, no hernia, no swelling, no dehiscence,     Assessment:  Post-Op 5 weeks s/p total abdominal hysterectomy and bilateral salpingectomy.  Doing well postoperatively.   Plan:  1.Wound care discussed   2. . current medications. n/a 3. Activity restrictions: caution with sexual activty 4. return to work: work note given to return in 3 days 5. Follow up prn. 6 Rx Miralax. 7 continue ibuprofen.    By signing my name below, I, Stephania Fragmin, attest that this documentation has been prepared under the direction and in the presence of Jonnie Kind, MD. Electronically Signed: Stephania Fragmin, ED Scribe. 11/04/2015. 9:31 AM.  I personally performed the services described in this documentation, which was SCRIBED in my presence. The recorded information has been reviewed and considered accurate. It has been edited as necessary during review. Jonnie Kind, MD

## 2015-11-04 NOTE — Progress Notes (Signed)
Patient ID: [name], female   DOB: [birth year], 45 y.o.   MRN: [id number] Pt here today for post op visit. Pt states that she has some itching at her incision but denies any other problems or concerns at this time.

## 2015-11-08 ENCOUNTER — Telehealth: Payer: Self-pay | Admitting: Obstetrics and Gynecology

## 2015-11-08 NOTE — Telephone Encounter (Signed)
Note given

## 2015-11-11 ENCOUNTER — Encounter: Payer: BLUE CROSS/BLUE SHIELD | Admitting: Obstetrics and Gynecology

## 2015-11-14 ENCOUNTER — Telehealth: Payer: Self-pay | Admitting: *Deleted

## 2015-11-15 ENCOUNTER — Telehealth: Payer: Self-pay | Admitting: Obstetrics and Gynecology

## 2015-11-15 NOTE — Telephone Encounter (Signed)
Pt given an appointment for tomorrow with Dr. Glo Herring.

## 2015-11-15 NOTE — Telephone Encounter (Signed)
Spoke with pt. Having pain in lower abdomen/ incision. Pt given an appointment for tomorrow with Dr.Ferguson.

## 2015-11-16 ENCOUNTER — Telehealth: Payer: Self-pay | Admitting: Obstetrics and Gynecology

## 2015-11-16 ENCOUNTER — Telehealth: Payer: Self-pay | Admitting: *Deleted

## 2015-11-16 ENCOUNTER — Ambulatory Visit: Payer: BLUE CROSS/BLUE SHIELD | Admitting: Obstetrics and Gynecology

## 2015-11-16 ENCOUNTER — Encounter: Payer: Self-pay | Admitting: Obstetrics and Gynecology

## 2015-11-16 NOTE — Telephone Encounter (Signed)
Pt was given an appointment to evaluate her pain.

## 2015-11-16 NOTE — Telephone Encounter (Signed)
Left message for pt , asking that she call and let us know what happened with the morning appt.., and if we'll be seeing her again.

## 2015-11-23 ENCOUNTER — Ambulatory Visit: Payer: BLUE CROSS/BLUE SHIELD | Admitting: Obstetrics and Gynecology

## 2016-01-06 ENCOUNTER — Emergency Department (HOSPITAL_COMMUNITY): Payer: BLUE CROSS/BLUE SHIELD

## 2016-01-06 ENCOUNTER — Encounter (HOSPITAL_COMMUNITY): Payer: Self-pay | Admitting: *Deleted

## 2016-01-06 ENCOUNTER — Emergency Department (HOSPITAL_COMMUNITY)
Admission: EM | Admit: 2016-01-06 | Discharge: 2016-01-07 | Disposition: A | Discharge status: Home / Self Care | Payer: BLUE CROSS/BLUE SHIELD | Attending: Emergency Medicine | Admitting: Emergency Medicine

## 2016-01-06 DIAGNOSIS — Z79899 Other long term (current) drug therapy: Secondary | ICD-10-CM | POA: Diagnosis not present

## 2016-01-06 DIAGNOSIS — F17210 Nicotine dependence, cigarettes, uncomplicated: Secondary | ICD-10-CM | POA: Insufficient documentation

## 2016-01-06 DIAGNOSIS — E119 Type 2 diabetes mellitus without complications: Secondary | ICD-10-CM | POA: Diagnosis not present

## 2016-01-06 DIAGNOSIS — I10 Essential (primary) hypertension: Secondary | ICD-10-CM | POA: Diagnosis not present

## 2016-01-06 DIAGNOSIS — R072 Precordial pain: Secondary | ICD-10-CM | POA: Diagnosis not present

## 2016-01-06 DIAGNOSIS — R0789 Other chest pain: Secondary | ICD-10-CM

## 2016-01-06 LAB — CBC
HEMATOCRIT: 42.2 % (ref 36.0–46.0)
HEMOGLOBIN: 14.6 g/dL (ref 12.0–15.0)
MCH: 27.9 pg (ref 26.0–34.0)
MCHC: 34.6 g/dL (ref 30.0–36.0)
MCV: 80.7 fL (ref 78.0–100.0)
Platelets: 277 10*3/uL (ref 150–400)
RBC: 5.23 MIL/uL — AB (ref 3.87–5.11)
RDW: 15.9 % — ABNORMAL HIGH (ref 11.5–15.5)
WBC: 8.7 10*3/uL (ref 4.0–10.5)

## 2016-01-06 LAB — BASIC METABOLIC PANEL
ANION GAP: 9 (ref 5–15)
BUN: 12 mg/dL (ref 6–20)
CALCIUM: 9.6 mg/dL (ref 8.9–10.3)
CHLORIDE: 102 mmol/L (ref 101–111)
CO2: 28 mmol/L (ref 22–32)
Creatinine, Ser: 0.7 mg/dL (ref 0.44–1.00)
GFR calc non Af Amer: >60 mL/min (ref >60)
Glucose, Bld: 99 mg/dL (ref 65–99)
POTASSIUM: 3.6 mmol/L (ref 3.5–5.1)
Sodium: 139 mmol/L (ref 135–145)

## 2016-01-06 LAB — I-STAT TROPONIN, ED: TROPONIN I, POC: 0 ng/mL (ref 0.00–0.08)

## 2016-01-06 LAB — D-DIMER, QUANTITATIVE: D-Dimer, Quant: 0.75 ug/mL-FEU — ABNORMAL HIGH (ref 0.00–0.50)

## 2016-01-06 MED ORDER — IOPAMIDOL (ISOVUE-370) INJECTION 76%
100.0000 mL | Freq: Once | INTRAVENOUS | Status: AC | PRN
  Administered 2016-01-06: 100 mL via INTRAVENOUS

## 2016-01-06 MED ORDER — KETOROLAC TROMETHAMINE 30 MG/ML IJ SOLN
30.0000 mg | Freq: Once | INTRAMUSCULAR | Status: AC
  Administered 2016-01-06: 30 mg via INTRAVENOUS
  Filled 2016-01-06: qty 1

## 2016-01-06 MED ORDER — IOPAMIDOL (ISOVUE-370) INJECTION 76%
INTRAVENOUS | Status: AC
  Filled 2016-01-06: qty 100

## 2016-01-06 MED ORDER — GI COCKTAIL ~~LOC~~
30.0000 mL | Freq: Once | ORAL | Status: AC
  Administered 2016-01-06: 30 mL via ORAL
  Filled 2016-01-06: qty 30

## 2016-01-06 MED ORDER — MORPHINE SULFATE (PF) 4 MG/ML IV SOLN
4.0000 mg | INTRAVENOUS | Status: DC | PRN
  Administered 2016-01-06: 4 mg via INTRAVENOUS
  Filled 2016-01-06: qty 1

## 2016-01-06 NOTE — ED Notes (Signed)
Patient transported to CT 

## 2016-01-06 NOTE — ED Notes (Signed)
Pt states onset of central chest pain that radiates to her back that started about 30 minutes prior to arrival. Pt says she was driving at onset, associated with sob, "hot flashes", dizziness,  and nausea. Pain 10/10 at worse, now 8/10 on arrival. Pt says she just sat in her car and rubbed her chest, which made it feel somewhat better.

## 2016-01-06 NOTE — ED Provider Notes (Signed)
CSN: [id number]     Arrival date & time 01/06/16  1924 History   First MD Initiated Contact with Patient 01/06/16 1943     Chief Complaint  Patient presents with  . Chest Pain      HPI  Pt was seen at 2005. Per pt, c/o gradual onset and persistence of constant mid-sternal chest "pain" that began while sitting in her car approximately 1900 PTA. Pt describes the pain as "sharp." Has been associated with "hot flashes" and SOB. Pt states she "rubbed her chest" which started to improve her discomfort. Denies hx of CP. Denies palpitations, no cough, no abd pain, no vomiting/diarrhea, no fevers, no rash, no injury, no calf/LE pain or unilateral swelling.   Past Medical History  Diagnosis Date  . Anemia   . Sickle cell trait ([place])   . Diabetes mellitus without complication ([place])   . Fibroids   . Hypertension    Past Surgical History  Procedure Laterality Date  . Tubal ligation    . Abdominal hysterectomy N/A 10/04/2015    Procedure: HYSTERECTOMY ABDOMINAL;  Surgeon: Jonnie Kind, MD;  Location: [facility];  Service: Gynecology;  Laterality: N/A;  . Bilateral salpingectomy Bilateral 10/04/2015    Procedure: BILATERAL SALPINGECTOMY;  Surgeon: Jonnie Kind, MD;  Location: [facility];  Service: Gynecology;  Laterality: Bilateral;   Family History  Problem Relation Age of Onset  . Diabetes Father   . Asthma Brother   . Allergic rhinitis Neg Hx   . Eczema Neg Hx   . Immunodeficiency Neg Hx   . Urticaria Neg Hx   . Hypertension Mother    Social History  Substance Use Topics  . Smoking status: Current Some Day Smoker -- 1.00 packs/day for 28 years    Types: Cigarettes  . Smokeless tobacco: Never Used  . Alcohol Use: Yes     Comment: occ.    Review of Systems ROS: Statement: All systems negative except as marked or noted in the HPI; Constitutional: Negative for fever and chills. ; ; Eyes: Negative for eye pain, redness and discharge. ; ; ENMT: Negative for ear pain, hoarseness, nasal  congestion, sinus pressure and sore throat. ; ; Cardiovascular: +CP, SOB. Negative for palpitations, diaphoresis, and peripheral edema. ; ; Respiratory: Negative for cough, wheezing and stridor. ; ; Gastrointestinal: Negative for nausea, vomiting, diarrhea, abdominal pain, blood in stool, hematemesis, jaundice and rectal bleeding. . ; ; Genitourinary: Negative for dysuria, flank pain and hematuria. ; ; Musculoskeletal: Negative for back pain and neck pain. Negative for swelling and trauma.; ; Skin: Negative for pruritus, rash, abrasions, blisters, bruising and skin lesion.; ; Neuro: Negative for headache, lightheadedness and neck stiffness. Negative for weakness, altered level of consciousness , altered mental status, extremity weakness, paresthesias, involuntary movement, seizure and syncope.      Allergies  Review of patient's allergies indicates no known allergies.  Home Medications   Prior to Admission medications   Medication Sig Start Date End Date Taking? Authorizing Provider  Albuterol Sulfate (PROAIR RESPICLICK) 123XX123 (90 Base) MCG/ACT AEPB Inhale 2 puffs into the lungs every 4 (four) hours as needed (for cough, wheeze or shortness of breath.). 10/15/15  Yes Roselyn Malachy Moan, MD  amLODipine (NORVASC) 10 MG tablet Take 1 tablet (10 mg total) by mouth daily. 10/06/15  Yes Jonnie Kind, MD  lisinopril-hydrochlorothiazide (PRINZIDE,ZESTORETIC) 10-12.5 MG tablet Take 1 tablet by mouth daily. 12/22/15  Yes Historical Provider, MD  triamcinolone (NASACORT ALLERGY 24HR) 55 MCG/ACT AERO nasal inhaler  Place 2 sprays into the nose daily.   Yes Historical Provider, MD   BP 137/81 mmHg  Pulse 80  Temp(Src) 97.5 F (36.4 C) (Oral)  Resp 18  Ht 5\' 3"  (1.6 m)  Wt 220 lb (99.791 kg)  BMI 38.98 kg/m2  SpO2 95%  LMP 06/23/2015 Physical Exam  2010; Physical examination:  Nursing notes reviewed; Vital signs and O2 SAT reviewed;  Constitutional: Well developed, Well nourished, Well hydrated, In no acute  distress; Head:  Normocephalic, atraumatic; Eyes: EOMI, PERRL, No scleral icterus; ENMT: Mouth and pharynx normal, Mucous membranes moist; Neck: Supple, Full range of motion, No lymphadenopathy; Cardiovascular: Regular rate and rhythm, No murmur, rub, or gallop; Respiratory: Breath sounds clear & equal bilaterally, No rales, rhonchi, wheezes.  Speaking full sentences with ease, Normal respiratory effort/excursion; Chest: Nontender, Movement normal; Abdomen: Soft, Nontender, Nondistended, Normal bowel sounds; Genitourinary: No CVA tenderness; Extremities: Pulses normal, No tenderness, No edema, No calf edema or asymmetry.; Neuro: AA&Ox3, Major CN grossly intact.  Speech clear. No gross focal motor or sensory deficits in extremities.; Skin: Color normal, Warm, Dry.   ED Course  Procedures (including critical care time) Labs Review  Imaging Review  I have personally reviewed and evaluated these images and lab results as part of my medical decision-making.   EKG Interpretation   Date/Time:  Friday January 06 2016 19:36:35 EDT Ventricular Rate:  83 PR Interval:  143 QRS Duration: 83 QT Interval:  362 QTC Calculation: 425 R Axis:   67 Text Interpretation:  Sinus rhythm Borderline repolarization abnormality  When compared with ECG of 10/05/2015 No significant change was found  Confirmed by [facility]  MD, Nunzio Cory [phone]) on 01/06/2016 8:34:55 PM      MDM  MDM Reviewed: previous chart, nursing note and vitals Reviewed previous: labs and ECG Interpretation: labs, ECG, x-ray and CT scan     Results for orders placed or performed during the hospital encounter of [id number]  Basic metabolic panel  Result Value Ref Range   Sodium 139 135 - 145 mmol/L   Potassium 3.6 3.5 - 5.1 mmol/L   Chloride 102 101 - 111 mmol/L   CO2 28 22 - 32 mmol/L   Glucose, Bld 99 65 - 99 mg/dL   BUN 12 6 - 20 mg/dL   Creatinine, Ser 0.70 0.44 - 1.00 mg/dL   Calcium 9.6 8.9 - 10.3 mg/dL   GFR calc non Af Amer >60 >60  mL/min   GFR calc Af Amer >60 >60 mL/min   Anion gap 9 5 - 15  CBC  Result Value Ref Range   WBC 8.7 4.0 - 10.5 K/uL   RBC 5.23 (H) 3.87 - 5.11 MIL/uL   Hemoglobin 14.6 12.0 - 15.0 g/dL   HCT 42.2 36.0 - 46.0 %   MCV 80.7 78.0 - 100.0 fL   MCH 27.9 26.0 - 34.0 pg   MCHC 34.6 30.0 - 36.0 g/dL   RDW 15.9 (H) 11.5 - 15.5 %   Platelets 277 150 - 400 K/uL  D-dimer, quantitative  Result Value Ref Range   D-Dimer, Quant 0.75 (H) 0.00 - 0.50 ug/mL-FEU  I-stat troponin, ED  Result Value Ref Range   Troponin i, poc 0.00 0.00 - 0.08 ng/mL   Comment 3           Dg Chest 2 View 01/06/2016  CLINICAL DATA:  Chest pain today before going into work, fever, smoker, HTN, diabetes EXAM: CHEST  2 VIEW COMPARISON:  01/07/2015 FINDINGS: Heart size is  upper limits normal. The lungs are free of focal consolidations and pleural effusions. IMPRESSION: No active cardiopulmonary disease. Electronically Signed   By: Nolon Nations M.D.   On: 01/06/2016 19:54   Ct Angio Chest Pe W/cm &/or Wo Cm 01/06/2016  CLINICAL DATA:  Central chest pain the radiates the back. EXAM: CT ANGIOGRAPHY CHEST WITH CONTRAST TECHNIQUE: Multidetector CT imaging of the chest was performed using the standard protocol during bolus administration of intravenous contrast. Multiplanar CT image reconstructions and MIPs were obtained to evaluate the vascular anatomy. CONTRAST:  100 cc Isovue COMPARISON:  CT thorax 09/25/2015 FINDINGS: Mediastinum/Nodes: No filling defects within pulmonary arteries to suggest acute pulmonary embolism. No acute findings aorta or great vessels. No mediastinal new adenopathy. No pericardial fluid. Esophagus normal. Lungs/Pleura: Mild interlobular septal thickening. No infiltrate or effusion. No pneumothorax. Upper abdomen: Limited view of the liver, kidneys, pancreas are unremarkable. Normal adrenal glands. Musculoskeletal: Degenerative osteophytosis of the thoracic spine. Review of the MIP images confirms the above  findings. IMPRESSION: 1. No pulmonary embolism. 2. Mild interstitial edema. 3. Osteophytosis of the spine. Electronically Signed   By: Suzy Bouchard M.D.   On: 01/06/2016 22:24    0005:  Pt denied hx CP, but EPIC chart reviewed: pt had CP after giving birth in 09/2015 (workup negative). D-dimer today was elevated; CT-A chest reassuring. VS remain stable. CP improved after meds. EKG and 1st troponin normal. Will repeat 2nd troponin. Pt and family aware and agreeable with plan. Sign out to Dr. Dina Rich.   Francine Graven, DO 01/07/16 0013

## 2016-01-06 NOTE — ED Notes (Signed)
Patient transported to X-ray 

## 2016-01-07 LAB — I-STAT TROPONIN, ED: Troponin i, poc: 0 ng/mL (ref 0.00–0.08)

## 2016-01-07 NOTE — ED Provider Notes (Signed)
Patient signed out pending repeat troponin. Repeat troponin is negative. Patient continues to be nontoxic vital signs reassuring. Follow-up with cardiology.  After history, exam, and medical workup I feel the patient has been appropriately medically screened and is safe for discharge home. Pertinent diagnoses were discussed with the patient. Patient was given return precautions.   Merryl Hacker, MD 01/07/16 [phone]

## 2016-01-07 NOTE — Discharge Instructions (Signed)
Nonspecific Chest Pain  °Chest pain can be caused by many different conditions. There is always a chance that your pain could be related to something serious, such as a heart attack or a blood clot in your lungs. Chest pain can also be caused by conditions that are not life-threatening. If you have chest pain, it is very important to follow up with your health care provider. °CAUSES  °Chest pain can be caused by: °· Heartburn. °· Pneumonia or bronchitis. °· Anxiety or stress. °· Inflammation around your heart (pericarditis) or lung (pleuritis or pleurisy). °· A blood clot in your lung. °· A collapsed lung (pneumothorax). It can develop suddenly on its own (spontaneous pneumothorax) or from trauma to the chest. °· Shingles infection (varicella-zoster virus). °· Heart attack. °· Damage to the bones, muscles, and cartilage that make up your chest wall. This can include: °¨ Bruised bones due to injury. °¨ Strained muscles or cartilage due to frequent or repeated coughing or overwork. °¨ Fracture to one or more ribs. °¨ Sore cartilage due to inflammation (costochondritis). °RISK FACTORS  °Risk factors for chest pain may include: °· Activities that increase your risk for trauma or injury to your chest. °· Respiratory infections or conditions that cause frequent coughing. °· Medical conditions or overeating that can cause heartburn. °· Heart disease or family history of heart disease. °· Conditions or health behaviors that increase your risk of developing a blood clot. °· Having had chicken pox (varicella zoster). °SIGNS AND SYMPTOMS °Chest pain can feel like: °· Burning or tingling on the surface of your chest or deep in your chest. °· Crushing, pressure, aching, or squeezing pain. °· Dull or sharp pain that is worse when you move, cough, or take a deep breath. °· Pain that is also felt in your back, neck, shoulder, or arm, or pain that spreads to any of these areas. °Your chest pain may come and go, or it may stay  constant. °DIAGNOSIS °Lab tests or other studies may be needed to find the cause of your pain. Your health care provider may have you take a test called an ambulatory ECG (electrocardiogram). An ECG records your heartbeat patterns at the time the test is performed. You may also have other tests, such as: °· Transthoracic echocardiogram (TTE). During echocardiography, sound waves are used to create a picture of all of the heart structures and to look at how blood flows through your heart. °· Transesophageal echocardiogram (TEE). This is a more advanced imaging test that obtains images from inside your body. It allows your health care provider to see your heart in finer detail. °· Cardiac monitoring. This allows your health care provider to monitor your heart rate and rhythm in real time. °· Holter monitor. This is a portable device that records your heartbeat and can help to diagnose abnormal heartbeats. It allows your health care provider to track your heart activity for several days, if needed. °· Stress tests. These can be done through exercise or by taking medicine that makes your heart beat more quickly. °· Blood tests. °· Imaging tests. °TREATMENT  °Your treatment depends on what is causing your chest pain. Treatment may include: °· Medicines. These may include: °¨ Acid blockers for heartburn. °¨ Anti-inflammatory medicine. °¨ Pain medicine for inflammatory conditions. °¨ Antibiotic medicine, if an infection is present. °¨ Medicines to dissolve blood clots. °¨ Medicines to treat coronary artery disease. °· Supportive care for conditions that do not require medicines. This may include: °¨ Resting. °¨ Applying heat   or cold packs to injured areas. °¨ Limiting activities until pain decreases. °HOME CARE INSTRUCTIONS °· If you were prescribed an antibiotic medicine, finish it all even if you start to feel better. °· Avoid any activities that bring on chest pain. °· Do not use any tobacco products, including  cigarettes, chewing tobacco, or electronic cigarettes. If you need help quitting, ask your health care provider. °· Do not drink alcohol. °· Take medicines only as directed by your health care provider. °· Keep all follow-up visits as directed by your health care provider. This is important. This includes any further testing if your chest pain does not go away. °· If heartburn is the cause for your chest pain, you may be told to keep your head raised (elevated) while sleeping. This reduces the chance that acid will go from your stomach into your esophagus. °· Make lifestyle changes as directed by your health care provider. These may include: °¨ Getting regular exercise. Ask your health care provider to suggest some activities that are safe for you. °¨ Eating a heart-healthy diet. A registered dietitian can help you to learn healthy eating options. °¨ Maintaining a healthy weight. °¨ Managing diabetes, if necessary. °¨ Reducing stress. °SEEK MEDICAL CARE IF: °· Your chest pain does not go away after treatment. °· You have a rash with blisters on your chest. °· You have a fever. °SEEK IMMEDIATE MEDICAL CARE IF:  °· Your chest pain is worse. °· You have an increasing cough, or you cough up blood. °· You have severe abdominal pain. °· You have severe weakness. °· You faint. °· You have chills. °· You have sudden, unexplained chest discomfort. °· You have sudden, unexplained discomfort in your arms, back, neck, or jaw. °· You have shortness of breath at any time. °· You suddenly start to sweat, or your skin gets clammy. °· You feel nauseous or you vomit. °· You suddenly feel light-headed or dizzy. °· Your heart begins to beat quickly, or it feels like it is skipping beats. °These symptoms may represent a serious problem that is an emergency. Do not wait to see if the symptoms will go away. Get medical help right away. Call your local emergency services (911 in the [place] Do not drive yourself to the hospital. °  °This  information is not intended to replace advice given to you by your health care provider. Make sure you discuss any questions you have with your health care provider. °  °Document Released: 06/13/2005 Document Revised: 09/24/2014 Document Reviewed: 04/09/2014 °[employer]vier Interactive Patient Education ©2016 [employer] ° °

## 2017-09-28 ENCOUNTER — Emergency Department (HOSPITAL_COMMUNITY)
Admission: EM | Admit: 2017-09-28 | Discharge: 2017-09-28 | Disposition: A | Discharge status: Home / Self Care | Payer: BLUE CROSS/BLUE SHIELD | Attending: Emergency Medicine | Admitting: Emergency Medicine

## 2017-09-28 ENCOUNTER — Emergency Department (HOSPITAL_COMMUNITY): Payer: BLUE CROSS/BLUE SHIELD

## 2017-09-28 ENCOUNTER — Encounter (HOSPITAL_COMMUNITY): Payer: Self-pay

## 2017-09-28 ENCOUNTER — Other Ambulatory Visit: Payer: Self-pay

## 2017-09-28 DIAGNOSIS — D573 Sickle-cell trait: Secondary | ICD-10-CM | POA: Insufficient documentation

## 2017-09-28 DIAGNOSIS — K047 Periapical abscess without sinus: Secondary | ICD-10-CM | POA: Diagnosis not present

## 2017-09-28 DIAGNOSIS — R51 Headache: Secondary | ICD-10-CM

## 2017-09-28 DIAGNOSIS — I10 Essential (primary) hypertension: Secondary | ICD-10-CM | POA: Diagnosis not present

## 2017-09-28 DIAGNOSIS — F17210 Nicotine dependence, cigarettes, uncomplicated: Secondary | ICD-10-CM | POA: Diagnosis not present

## 2017-09-28 DIAGNOSIS — E119 Type 2 diabetes mellitus without complications: Secondary | ICD-10-CM | POA: Insufficient documentation

## 2017-09-28 DIAGNOSIS — Z79899 Other long term (current) drug therapy: Secondary | ICD-10-CM | POA: Diagnosis not present

## 2017-09-28 DIAGNOSIS — R519 Headache, unspecified: Secondary | ICD-10-CM

## 2017-09-28 LAB — BASIC METABOLIC PANEL
Anion gap: 9 (ref 5–15)
BUN: 15 mg/dL (ref 6–20)
CO2: 24 mmol/L (ref 22–32)
CREATININE: 0.71 mg/dL (ref 0.44–1.00)
Calcium: 9 mg/dL (ref 8.9–10.3)
Chloride: 106 mmol/L (ref 101–111)
GFR calc Af Amer: >60 mL/min (ref >60)
GLUCOSE: 104 mg/dL — AB (ref 65–99)
POTASSIUM: 3.5 mmol/L (ref 3.5–5.1)
Sodium: 139 mmol/L (ref 135–145)

## 2017-09-28 LAB — CBC WITH DIFFERENTIAL/PLATELET
Basophils Absolute: 0.1 10*3/uL (ref 0.0–0.1)
Basophils Relative: 1 %
EOS ABS: 0.4 10*3/uL (ref 0.0–0.7)
EOS PCT: 5 %
HCT: 40.7 % (ref 36.0–46.0)
Hemoglobin: 13.4 g/dL (ref 12.0–15.0)
LYMPHS PCT: 30 %
Lymphs Abs: 2.3 10*3/uL (ref 0.7–4.0)
MCH: 28.7 pg (ref 26.0–34.0)
MCHC: 32.9 g/dL (ref 30.0–36.0)
MCV: 87.2 fL (ref 78.0–100.0)
MONO ABS: 0.9 10*3/uL (ref 0.1–1.0)
Monocytes Relative: 12 %
Neutro Abs: 4.1 10*3/uL (ref 1.7–7.7)
Neutrophils Relative %: 52 %
PLATELETS: 221 10*3/uL (ref 150–400)
RBC: 4.67 MIL/uL (ref 3.87–5.11)
RDW: 13.3 % (ref 11.5–15.5)
WBC: 7.7 10*3/uL (ref 4.0–10.5)

## 2017-09-28 MED ORDER — AMLODIPINE BESYLATE 10 MG PO TABS: 10.0000 mg | ORAL_TABLET | Freq: Every day | ORAL | 0 refills | Status: DC

## 2017-09-28 MED ORDER — IBUPROFEN 800 MG PO TABS
800.0000 mg | ORAL_TABLET | Freq: Once | ORAL | Status: AC
  Administered 2017-09-28: 800 mg via ORAL
  Filled 2017-09-28: qty 1

## 2017-09-28 MED ORDER — TETANUS-DIPHTH-ACELL PERTUSSIS 5-2.5-18.5 LF-MCG/0.5 IM SUSP
INTRAMUSCULAR | Status: AC
  Filled 2017-09-28: qty 0.5

## 2017-09-28 MED ORDER — CLINDAMYCIN HCL 150 MG PO CAPS: ORAL_CAPSULE | ORAL | 0 refills | Status: DC

## 2017-09-28 MED ORDER — TRAMADOL HCL 50 MG PO TABS: 50.0000 mg | ORAL_TABLET | Freq: Four times a day (QID) | ORAL | 0 refills | Status: DC | PRN

## 2017-09-28 MED ORDER — CLINDAMYCIN HCL 150 MG PO CAPS
300.0000 mg | ORAL_CAPSULE | Freq: Once | ORAL | Status: AC
  Administered 2017-09-28: 300 mg via ORAL
  Filled 2017-09-28: qty 2

## 2017-09-28 MED ORDER — PROMETHAZINE HCL 12.5 MG PO TABS
12.5000 mg | ORAL_TABLET | Freq: Once | ORAL | Status: AC
  Administered 2017-09-28: 12.5 mg via ORAL
  Filled 2017-09-28: qty 1

## 2017-09-28 MED ORDER — ONDANSETRON 4 MG PO TBDP
4.0000 mg | ORAL_TABLET | Freq: Once | ORAL | Status: AC
  Administered 2017-09-28: 4 mg via ORAL
  Filled 2017-09-28: qty 1

## 2017-09-28 MED ORDER — AMLODIPINE BESYLATE 5 MG PO TABS
10.0000 mg | ORAL_TABLET | Freq: Once | ORAL | Status: AC
  Administered 2017-09-28: 10 mg via ORAL
  Filled 2017-09-28: qty 2

## 2017-09-28 MED ORDER — MORPHINE SULFATE (PF) 4 MG/ML IV SOLN
4.0000 mg | Freq: Once | INTRAVENOUS | Status: AC
  Administered 2017-09-28: 4 mg via INTRAMUSCULAR
  Filled 2017-09-28: qty 1

## 2017-09-28 MED ORDER — IBUPROFEN 600 MG PO TABS: 600.0000 mg | ORAL_TABLET | Freq: Four times a day (QID) | ORAL | 0 refills | Status: DC

## 2017-09-28 NOTE — ED Notes (Signed)
PT complains of L eye pain since last pm

## 2017-09-28 NOTE — ED Provider Notes (Signed)
[facility] EMERGENCY DEPARTMENT Provider Note   CSN: [id number] Arrival date & time: 09/28/17  1802     History   Chief Complaint Chief Complaint  Patient presents with  . Eye Problem    HPI [name] is a 47 y.o. female.  Patient is a 47 year old female who presents to the emergency department with a complaint of eye pain and headache.  The patient states that this problem started on yesterday January 11.  She did not notice the pain during breakfast or lunch, but after dinner she began to have right eye pain.  She describes it as a stabbing pain as if something was stabbing her in the eye.  The pain then radiates to her teeth.  It is of note that the patient has a history of hypertension.  There is a question of difficulty with diabetes.  The patient states she is supposed to be on a special high blood pressure diet, but she is not been compliant.  She also states that she has been missing some doses of her blood pressure medications.  The pain is described as a stabbing type pain primarily involving the right eye and jaw.  No chest pain reported.  No unusual shortness of breath.  The patient states she has had some episodes of seeing double, and this has extended to this morning.  Patient presents to the emergency department at this time for assistance with this issue.      Past Medical History:  Diagnosis Date  . Anemia   . Diabetes mellitus without complication ([place])   . Fibroids   . Hypertension   . Sickle cell trait [facility])     Patient Active Problem List   Diagnosis Date Noted  . S/P total abdominal hysterectomy 10/04/2015  . Excessive or frequent menstruation 07/20/2013  . OBESITY 01/07/2008  . NICOTINE ADDICTION 01/07/2008    Past Surgical History:  Procedure Laterality Date  . ABDOMINAL HYSTERECTOMY N/A 10/04/2015   Procedure: HYSTERECTOMY ABDOMINAL;  Surgeon: Jonnie Kind, MD;  Location: [facility];  Service: Gynecology;  Laterality: N/A;  . BILATERAL  SALPINGECTOMY Bilateral 10/04/2015   Procedure: BILATERAL SALPINGECTOMY;  Surgeon: Jonnie Kind, MD;  Location: [facility];  Service: Gynecology;  Laterality: Bilateral;  . TUBAL LIGATION      OB History    No data available       Home Medications    Prior to Admission medications   Medication Sig Start Date End Date Taking? Authorizing Provider  Albuterol Sulfate (PROAIR RESPICLICK) 409 (90 Base) MCG/ACT AEPB Inhale 2 puffs into the lungs every 4 (four) hours as needed (for cough, wheeze or shortness of breath.). 10/15/15   Gean Quint, MD  amLODipine (NORVASC) 10 MG tablet Take 1 tablet (10 mg total) by mouth daily. 10/06/15   Jonnie Kind, MD  lisinopril-hydrochlorothiazide (PRINZIDE,ZESTORETIC) 10-12.5 MG tablet Take 1 tablet by mouth daily. 12/22/15   [provider]  triamcinolone (NASACORT ALLERGY 24HR) 55 MCG/ACT AERO nasal inhaler Place 2 sprays into the nose daily.    [provider]    Family History Family History  Problem Relation Age of Onset  . Diabetes Father   . Asthma Brother   . Hypertension Mother   . Allergic rhinitis Neg Hx   . Eczema Neg Hx   . Immunodeficiency Neg Hx   . Urticaria Neg Hx     Social History Social History   Tobacco Use  . Smoking status: Current Some Day Smoker  Packs/day: 1.00    Years: 28.00    Pack years: 28.00    Types: Cigarettes  . Smokeless tobacco: Never Used  Substance Use Topics  . Alcohol use: Yes    Comment: occ.  . Drug use: No     Allergies   Patient has no known allergies.   Review of Systems Review of Systems  Constitutional: Negative for activity change.       All ROS Neg except as noted in HPI  HENT: Positive for dental problem. Negative for drooling, nosebleeds, sore throat and voice change.   Eyes: Positive for pain. Negative for photophobia, discharge, redness and itching.  Respiratory: Negative for cough, shortness of breath and wheezing.   Cardiovascular: Negative for  chest pain and palpitations.  Gastrointestinal: Negative for abdominal pain and blood in stool.  Genitourinary: Negative for dysuria, frequency and hematuria.  Musculoskeletal: Negative for arthralgias, back pain and neck pain.  Skin: Negative.   Neurological: Positive for headaches. Negative for dizziness, seizures and speech difficulty.  Psychiatric/Behavioral: Negative for confusion and hallucinations.     Physical Exam Updated Vital Signs BP (!) 175/88 (BP Location: Right Arm)   Pulse 68   Temp 98.4 F (36.9 C) (Oral)   Resp 16   Ht 5\' 3"  (1.6 m)   Wt 99.8 kg (220 lb)   LMP 06/23/2015   SpO2 98%   BMI 38.97 kg/m   Physical Exam  Constitutional: She is oriented to person, place, and time. She appears well-developed and well-nourished.  Non-toxic appearance. No distress.  HENT:  Head: Normocephalic and atraumatic.  Right Ear: Tympanic membrane and external ear normal.  Left Ear: Tympanic membrane and external ear normal.  Multiple dental caries noted, right upper greater than right lower.  Airway is patent.  Speech is clear.  There are no visible abscess appreciated.  Eyes: Conjunctivae, EOM and lids are normal. Pupils are equal, round, and reactive to light. Right eye exhibits no discharge. Left eye exhibits no discharge. No scleral icterus.  Fundoscopic exam:      The right eye shows no AV nicking, no hemorrhage and no papilledema. The right eye shows red reflex.       The left eye shows no AV nicking, no hemorrhage and no papilledema. The left eye shows red reflex.  Slit lamp exam:      The right eye shows no hyphema and no fluorescein uptake.       The left eye shows no hyphema.  Neck: Normal range of motion. Neck supple. Carotid bruit is not present. No tracheal deviation present.  Cardiovascular: Normal rate, regular rhythm, normal heart sounds, intact distal pulses and normal pulses.  Pulmonary/Chest: Effort normal and breath sounds normal. No stridor. No respiratory  distress. She has no wheezes. She has no rales.  Abdominal: Soft. Bowel sounds are normal. She exhibits no distension. There is no tenderness. There is no rebound and no guarding.  Musculoskeletal: Normal range of motion. She exhibits no edema or tenderness.  Lymphadenopathy:       Head (right side): No submandibular adenopathy present.       Head (left side): No submandibular adenopathy present.    She has no cervical adenopathy.  Neurological: She is alert and oriented to person, place, and time. She has normal strength. No cranial nerve deficit (no facial droop, extraocular movements intact, no slurred speech) or sensory deficit. She exhibits normal muscle tone. She displays no seizure activity. Coordination normal.  Skin: Skin is warm and  dry. No rash noted.  Psychiatric: She has a normal mood and affect. Her speech is normal.  Nursing note and vitals reviewed.    ED Treatments / Results  Labs (all labs ordered are listed, but only abnormal results are displayed) Labs Reviewed  BASIC METABOLIC PANEL  CBC WITH DIFFERENTIAL/PLATELET    EKG  EKG Interpretation None       Radiology No results found.  Procedures Procedures (including critical care time)  Medications Ordered in ED Medications  morphine 4 MG/ML injection 4 mg (4 mg Intramuscular Given 09/28/17 1856)  ondansetron (ZOFRAN-ODT) disintegrating tablet 4 mg (4 mg Oral Given 09/28/17 1858)     Initial Impression / Assessment and Plan / ED Course  I have reviewed the triage vital signs and the nursing notes.  Pertinent labs & imaging results that were available during my care of the patient were reviewed by me and considered in my medical decision making (see chart for details).       Final Clinical Impressions(s) / ED Diagnoses MDM Blood pressure elevated at 180/98 on admission, later 175/107.  Patient complains of headache and eye pain.  Patient treated with IV morphine.  The basic metabolic panel is  negative for acute problem.  There is no evidence of endorgan damage involving the kidney.  The complete blood count is well within normal limits.  The CT scan of the head is read as normal.  CT maxillofacial bones reveals a dental carry on the right with an abscess.  Also a dental carry on the left with a smaller abscess.  No other abnormalities.  Particular there is no abnormalities related to the orbit or to the globe on the right.  Vital signs followed closely.  Blood pressure was elevated during a large portion of the patient's stay.  Review of the records show that the patient has been on Norvasc in the past.  Norvasc was again used.  The patient's blood pressure is beginning to come down.  I ambulated the patient in the room as well as in the hall.  Gait is intact.  There are no gross neurologic deficits appreciated.  The patient's pain is improving.  Feel that it is safe for the patient to be discharged home.  The patient is strongly advised to see a dentist as soon as possible concerning the abscess formations of the upper jaw area on the right and on the left.  Patient is also advised to see her primary physician for recheck of the blood pressure to see if the Norvasc is still needed.  The patient is to return to the emergency department if any changes, problems, or concerns.  The patient is to follow-up with her primary physician concerning her blood pressure, and her dentist concerning her dental issues.  Patient is in agreement with this plan.   Final diagnoses:  Dental abscess  Nonintractable headache, unspecified chronicity pattern, unspecified headache type  Essential hypertension    ED Discharge Orders        Ordered    amLODipine (NORVASC) 10 MG tablet  Daily     09/28/17 2202    clindamycin (CLEOCIN) 150 MG capsule     09/28/17 2203    traMADol (ULTRAM) 50 MG tablet  Every 6 hours PRN     09/28/17 2203    ibuprofen (ADVIL,MOTRIN) 600 MG tablet  4 times daily     09/28/17 2203         Lily Kocher, PA-C 09/28/17 2218  Francine Graven, DO 09/29/17 1550

## 2017-09-28 NOTE — ED Triage Notes (Addendum)
Right eye pain "feels like something is stabbing in my eye" that radiates to teeth since last night. Denies any known foreign object in eye.

## 2017-09-28 NOTE — Discharge Instructions (Addendum)
Your head scan is negative for stroke or other acute problem of the brain.  The CT scan of your facial structures reveals a moderate size abscess of your second molar on the right, you also have a small abscess on the left.  It is extremely important that you see a dentist as soon as possible.  Please use clindamycin 2 times daily with food.  Please use ibuprofen with breakfast, lunch, dinner, and at bedtime.  You may use Ultram for more severe pain.  Your blood pressure was elevated significantly.  Please add Norvasc, one at bedtime.  Please have your blood pressure rechecked, and take this medication with you so that your doctors can determine if it is still needed.

## 2017-09-28 NOTE — ED Notes (Signed)
OD 20/20  OS 20/25  OU 20/25

## 2017-09-28 NOTE — ED Notes (Signed)
To CT

## 2017-11-12 ENCOUNTER — Encounter: Payer: Self-pay | Admitting: Orthopedic Surgery

## 2017-12-23 ENCOUNTER — Ambulatory Visit: Payer: Self-pay | Admitting: Orthopedic Surgery

## 2018-01-13 ENCOUNTER — Ambulatory Visit: Payer: Self-pay | Admitting: Orthopedic Surgery

## 2018-01-13 ENCOUNTER — Encounter: Payer: Self-pay | Admitting: Orthopedic Surgery

## 2018-01-13 NOTE — Progress Notes (Deleted)
  NEW PATIENT OFFICE VISIT   No chief complaint on file.    MEDICAL DECISION SECTION  xrays ordered? ***  My independent reading of xrays: ***   No diagnosis found.   PLAN: ***  No orders of the defined types were placed in this encounter.  Injection? *** MRI/CT/? ***   No chief complaint on file.   HPI  ROS   Past Medical History:  Diagnosis Date  . Anemia   . Diabetes mellitus without complication ([place])   . Fibroids   . Hypertension   . Sickle cell trait [facility])     Past Surgical History:  Procedure Laterality Date  . ABDOMINAL HYSTERECTOMY N/A 10/04/2015   Procedure: HYSTERECTOMY ABDOMINAL;  Surgeon: Jonnie Kind, MD;  Location: [facility];  Service: Gynecology;  Laterality: N/A;  . BILATERAL SALPINGECTOMY Bilateral 10/04/2015   Procedure: BILATERAL SALPINGECTOMY;  Surgeon: Jonnie Kind, MD;  Location: [facility];  Service: Gynecology;  Laterality: Bilateral;  . TUBAL LIGATION      Family History  Problem Relation Age of Onset  . Diabetes Father   . Asthma Brother   . Hypertension Mother   . Allergic rhinitis Neg Hx   . Eczema Neg Hx   . Immunodeficiency Neg Hx   . Urticaria Neg Hx    Social History   Tobacco Use  . Smoking status: Current Some Day Smoker    Packs/day: 1.00    Years: 28.00    Pack years: 28.00    Types: Cigarettes  . Smokeless tobacco: Never Used  Substance Use Topics  . Alcohol use: Yes    Comment: occ.  . Drug use: No    @ALL @  No outpatient medications have been marked as taking for the 01/13/18 encounter (Appointment) with Carole Civil, MD.   Current Facility-Administered Medications for the 01/13/18 encounter (Appointment) with Carole Civil, MD  Medication  . ipratropium (ATROVENT) nebulizer solution 0.5 mg  . levalbuterol (XOPENEX) nebulizer solution 1.25 mg    LMP 06/23/2015   Physical Exam  Ortho Exam

## 2018-09-24 ENCOUNTER — Encounter (HOSPITAL_COMMUNITY): Payer: Self-pay | Admitting: Emergency Medicine

## 2018-09-24 ENCOUNTER — Other Ambulatory Visit: Payer: Self-pay

## 2018-09-24 ENCOUNTER — Emergency Department (HOSPITAL_COMMUNITY): Payer: BLUE CROSS/BLUE SHIELD

## 2018-09-24 ENCOUNTER — Emergency Department (HOSPITAL_COMMUNITY)
Admission: EM | Admit: 2018-09-24 | Discharge: 2018-09-24 | Disposition: A | Discharge status: Home / Self Care | Payer: BLUE CROSS/BLUE SHIELD | Attending: Emergency Medicine | Admitting: Emergency Medicine

## 2018-09-24 DIAGNOSIS — E1165 Type 2 diabetes mellitus with hyperglycemia: Secondary | ICD-10-CM | POA: Diagnosis not present

## 2018-09-24 DIAGNOSIS — R739 Hyperglycemia, unspecified: Secondary | ICD-10-CM

## 2018-09-24 DIAGNOSIS — Z79899 Other long term (current) drug therapy: Secondary | ICD-10-CM | POA: Insufficient documentation

## 2018-09-24 DIAGNOSIS — R350 Frequency of micturition: Secondary | ICD-10-CM | POA: Diagnosis present

## 2018-09-24 DIAGNOSIS — F17210 Nicotine dependence, cigarettes, uncomplicated: Secondary | ICD-10-CM | POA: Diagnosis not present

## 2018-09-24 DIAGNOSIS — I10 Essential (primary) hypertension: Secondary | ICD-10-CM | POA: Diagnosis not present

## 2018-09-24 LAB — CBG MONITORING, ED
Glucose-Capillary: 478 mg/dL — ABNORMAL HIGH (ref 70–99)
Glucose-Capillary: 385 mg/dL — ABNORMAL HIGH (ref 70–99)

## 2018-09-24 LAB — CBC
HEMATOCRIT: 46.7 % — AB (ref 36.0–46.0)
Hemoglobin: 15.5 g/dL — ABNORMAL HIGH (ref 12.0–15.0)
MCH: 28 pg (ref 26.0–34.0)
MCHC: 33.2 g/dL (ref 30.0–36.0)
MCV: 84.4 fL (ref 80.0–100.0)
Platelets: 197 10*3/uL (ref 150–400)
RBC: 5.53 MIL/uL — ABNORMAL HIGH (ref 3.87–5.11)
RDW: 12.6 % (ref 11.5–15.5)
WBC: 6.8 10*3/uL (ref 4.0–10.5)
nRBC: 0 % (ref 0.0–0.2)

## 2018-09-24 LAB — URINALYSIS, ROUTINE W REFLEX MICROSCOPIC
Bilirubin Urine: NEGATIVE
Glucose, UA: >=500 mg/dL — AB
Ketones, ur: 5 mg/dL — AB
Leukocytes, UA: NEGATIVE
Nitrite: NEGATIVE
Protein, ur: 100 mg/dL — AB
Specific Gravity, Urine: 1.026 (ref 1.005–1.030)
pH: 6 (ref 5.0–8.0)

## 2018-09-24 LAB — BASIC METABOLIC PANEL
Anion gap: 12 (ref 5–15)
BUN: 11 mg/dL (ref 6–20)
CHLORIDE: 96 mmol/L — AB (ref 98–111)
CO2: 26 mmol/L (ref 22–32)
Calcium: 9.3 mg/dL (ref 8.9–10.3)
Creatinine, Ser: 0.69 mg/dL (ref 0.44–1.00)
GFR calc Af Amer: >60 mL/min (ref >60)
GFR calc non Af Amer: >60 mL/min (ref >60)
Glucose, Bld: 477 mg/dL — ABNORMAL HIGH (ref 70–99)
Potassium: 3.4 mmol/L — ABNORMAL LOW (ref 3.5–5.1)
Sodium: 134 mmol/L — ABNORMAL LOW (ref 135–145)

## 2018-09-24 LAB — BLOOD GAS, VENOUS
Acid-Base Excess: 1.2 mmol/L (ref 0.0–2.0)
Bicarbonate: 24.4 mmol/L (ref 20.0–28.0)
FIO2: 0.21
O2 Saturation: 66.7 %
PATIENT TEMPERATURE: 36.5
pCO2, Ven: 42.3 mmHg — ABNORMAL LOW (ref 44.0–60.0)
pH, Ven: 7.397 (ref 7.250–7.430)
pO2, Ven: 36.8 mmHg (ref 32.0–45.0)

## 2018-09-24 MED ORDER — METFORMIN HCL 500 MG PO TABS
500.0000 mg | ORAL_TABLET | Freq: Once | ORAL | Status: AC
  Administered 2018-09-24: 500 mg via ORAL
  Filled 2018-09-24: qty 1

## 2018-09-24 MED ORDER — METFORMIN HCL 500 MG PO TABS: 500.0000 mg | ORAL_TABLET | Freq: Two times a day (BID) | ORAL | 0 refills | Status: DC

## 2018-09-24 MED ORDER — SODIUM CHLORIDE 0.9 % IV BOLUS
1000.0000 mL | Freq: Once | INTRAVENOUS | Status: AC
  Administered 2018-09-24: 1000 mL via INTRAVENOUS

## 2018-09-24 MED ORDER — POTASSIUM CHLORIDE CRYS ER 20 MEQ PO TBCR
40.0000 meq | EXTENDED_RELEASE_TABLET | Freq: Once | ORAL | Status: AC
  Administered 2018-09-24: 40 meq via ORAL
  Filled 2018-09-24: qty 2

## 2018-09-24 NOTE — ED Provider Notes (Signed)
[facility] EMERGENCY DEPARTMENT Provider Note   CSN: [id number] Arrival date & time: 09/24/18  1722     History   Chief Complaint Chief Complaint  Patient presents with  . Hyperglycemia    HPI [name] is a 48 y.o. female.  Patient is a borderline diabetic but not on any medications for diabetes.  She has been noticing being very thirsty drinking a lot of fluids and urinating a lot for the last week or so.  She has had some blurry vision.  Subjective fevers and she is had a little nonproductive cough.  She went to her PCP and her blood sugar was critical high.  She was referred here for further evaluation.  No nausea vomiting or diarrhea no dysuria.    The history is provided by the patient.  Hyperglycemia  Blood sugar level PTA:  450 Severity:  Moderate Onset quality:  Gradual Timing:  Unable to specify Chronicity:  New Diabetes status:  Controlled with diet Current diabetic therapy:  None Context: new diabetes diagnosis   Relieved by:  Nothing Ineffective treatments:  None tried Associated symptoms: blurred vision, fatigue, fever, increased thirst and polyuria   Associated symptoms: no abdominal pain, no altered mental status, no chest pain, no confusion, no diaphoresis, no nausea, no shortness of breath, no syncope and no vomiting   Risk factors: family hx of diabetes     Past Medical History:  Diagnosis Date  . Anemia   . Diabetes mellitus without complication ([place])   . Fibroids   . Hypertension   . Sickle cell trait [facility])     Patient Active Problem List   Diagnosis Date Noted  . S/P total abdominal hysterectomy 10/04/2015  . Excessive or frequent menstruation 07/20/2013  . OBESITY 01/07/2008  . NICOTINE ADDICTION 01/07/2008    Past Surgical History:  Procedure Laterality Date  . ABDOMINAL HYSTERECTOMY N/A 10/04/2015   Procedure: HYSTERECTOMY ABDOMINAL;  Surgeon: Jonnie Kind, MD;  Location: [facility];  Service: Gynecology;  Laterality: N/A;  .  BILATERAL SALPINGECTOMY Bilateral 10/04/2015   Procedure: BILATERAL SALPINGECTOMY;  Surgeon: Jonnie Kind, MD;  Location: [facility];  Service: Gynecology;  Laterality: Bilateral;  . TUBAL LIGATION       OB History    Gravida  4   Para  4   Term  4   Preterm      AB      Living  4     SAB      TAB      Ectopic      Multiple      Live Births               Home Medications    Prior to Admission medications   Medication Sig Start Date End Date Taking? Authorizing Provider  Albuterol Sulfate (PROAIR RESPICLICK) 235 (90 Base) MCG/ACT AEPB Inhale 2 puffs into the lungs every 4 (four) hours as needed (for cough, wheeze or shortness of breath.). 10/15/15   Gean Quint, MD  amLODipine (NORVASC) 10 MG tablet Take 1 tablet (10 mg total) by mouth daily. 1 po daily at hs 09/28/17   Lily Kocher, PA-C  clindamycin (CLEOCIN) 150 MG capsule 2 po bid with food 09/28/17   Lily Kocher, PA-C  ibuprofen (ADVIL,MOTRIN) 600 MG tablet Take 1 tablet (600 mg total) by mouth 4 (four) times daily. 09/28/17   Lily Kocher, PA-C  lisinopril-hydrochlorothiazide (PRINZIDE,ZESTORETIC) 10-12.5 MG tablet Take 1 tablet by mouth daily. 12/22/15  [provider]  traMADol (ULTRAM) 50 MG tablet Take 1 tablet (50 mg total) by mouth every 6 (six) hours as needed. 09/28/17   Lily Kocher, PA-C  triamcinolone (NASACORT ALLERGY 24HR) 55 MCG/ACT AERO nasal inhaler Place 2 sprays into the nose daily.    [provider]    Family History Family History  Problem Relation Age of Onset  . Diabetes Father   . Asthma Brother   . Hypertension Mother   . Allergic rhinitis Neg Hx   . Eczema Neg Hx   . Immunodeficiency Neg Hx   . Urticaria Neg Hx     Social History Social History   Tobacco Use  . Smoking status: Current Every Day Smoker    Packs/day: 0.50    Years: 28.00    Pack years: 14.00    Types: Cigarettes  . Smokeless tobacco: Never Used  Substance Use Topics  . Alcohol  use: Yes    Comment: occ.  . Drug use: No     Allergies   Patient has no known allergies.   Review of Systems Review of Systems  Constitutional: Positive for fatigue and fever. Negative for diaphoresis.  Eyes: Positive for blurred vision and visual disturbance.  Respiratory: Negative for shortness of breath.   Cardiovascular: Negative for chest pain and syncope.  Gastrointestinal: Negative for abdominal pain, nausea and vomiting.  Endocrine: Positive for polydipsia and polyuria.  Genitourinary: Positive for frequency. Negative for urgency.  Musculoskeletal: Negative for back pain.  Skin: Negative for rash.  Neurological: Negative for headaches.  Psychiatric/Behavioral: Negative for confusion.     Physical Exam Updated Vital Signs BP (!) 110/53   Pulse 78   Temp 97.7 F (36.5 C) (Oral)   Resp 18   Ht 5\' 4"  (1.626 m)   Wt 90.7 kg   LMP 06/23/2015   SpO2 98%   BMI 34.33 kg/m   Physical Exam Vitals signs and nursing note reviewed.  Constitutional:      General: She is not in acute distress.    Appearance: Normal appearance. She is well-developed.  HENT:     Head: Normocephalic and atraumatic.  Eyes:     Conjunctiva/sclera: Conjunctivae normal.  Neck:     Musculoskeletal: Neck supple.  Cardiovascular:     Rate and Rhythm: Normal rate and regular rhythm.     Heart sounds: No murmur.  Pulmonary:     Effort: Pulmonary effort is normal. No respiratory distress.     Breath sounds: Normal breath sounds.  Abdominal:     Palpations: Abdomen is soft.     Tenderness: There is no abdominal tenderness.  Musculoskeletal:        General: No tenderness or signs of injury.     Right lower leg: No edema.     Left lower leg: No edema.  Skin:    General: Skin is warm and dry.     Capillary Refill: Capillary refill takes less than 2 seconds.  Neurological:     General: No focal deficit present.     Mental Status: She is alert and oriented to person, place, and time.      Gait: Gait normal.      ED Treatments / Results  Labs (all labs ordered are listed, but only abnormal results are displayed) Labs Reviewed  BASIC METABOLIC PANEL - Abnormal; Notable for the following components:      Result Value   Sodium 134 (*)    Potassium 3.4 (*)    Chloride  96 (*)    Glucose, Bld 477 (*)    All other components within normal limits  CBC - Abnormal; Notable for the following components:   RBC 5.53 (*)    Hemoglobin 15.5 (*)    HCT 46.7 (*)    All other components within normal limits  URINALYSIS, ROUTINE W REFLEX MICROSCOPIC - Abnormal; Notable for the following components:   Color, Urine STRAW (*)    Glucose, UA >=500 (*)    Hgb urine dipstick SMALL (*)    Ketones, ur 5 (*)    Protein, ur 100 (*)    Bacteria, UA RARE (*)    All other components within normal limits  BLOOD GAS, VENOUS - Abnormal; Notable for the following components:   pCO2, Ven 42.3 (*)    All other components within normal limits  CBG MONITORING, ED - Abnormal; Notable for the following components:   Glucose-Capillary 478 (*)    All other components within normal limits  CBG MONITORING, ED - Abnormal; Notable for the following components:   Glucose-Capillary 385 (*)    All other components within normal limits  CBG MONITORING, ED    EKG None  Radiology Dg Chest 2 View  Result Date: 09/24/2018 CLINICAL DATA:  48 y/o F; 3 days of dry cough and shortness of breath. EXAM: CHEST - 2 VIEW COMPARISON:  01/06/2016 CT angiogram chest. 01/07/2015 chest radiograph. FINDINGS: Stable borderline enlarged cardiac silhouette given projection and technique. Mild basilar predominant reticular opacities. No consolidation, effusion, or pneumothorax. No acute osseous abnormality. Spondylosis of thoracic spine. IMPRESSION: Mild basilar predominant reticular opacities, probably bronchitic changes or interstitial edema. No consolidation. Stable borderline cardiomegaly. Electronically Signed   By: Kristine Garbe M.D.   On: 09/24/2018 19:25    Procedures Procedures (including critical care time)  Medications Ordered in ED Medications  sodium chloride 0.9 % bolus 1,000 mL (0 mLs Intravenous Stopped 09/24/18 1945)  potassium chloride SA (K-DUR,KLOR-CON) CR tablet 40 mEq (40 mEq Oral Given 09/24/18 1947)  metFORMIN (GLUCOPHAGE) tablet 500 mg (500 mg Oral Given 09/24/18 2105)     Initial Impression / Assessment and Plan / ED Course  I have reviewed the triage vital signs and the nursing notes.  Pertinent labs & imaging results that were available during my care of the patient were reviewed by me and considered in my medical decision making (see chart for details).  Clinical Course as of Sep 25 1014  Wed Sep 24, 2018  1839 Patient with a history of diet-controlled diabetes here with signs of hyperglycemia and fingerstick blood sugar of 478.  She is getting some labs and chest x-ray along with IV fluids.  Will likely need a dose of some insulin.  If the rest of her work-up does not show any signs of downstream damage she may be able to be discharged on some oral hyperglycemics.   [MB]  1901 ABG does not show any acidosis.  Patient's hemoglobin elevated so she is very likely hemoconcentrated.   [MB]  2051 Patient feeling improved.  Her blood sugar has not markedly changed but the rest of her lab work looks fairly good.  We will give her a dose of metformin here and send her home with a prescription and she is going to follow-up with her primary care doctor.  She has access to a fingerstick blood sugar so she will check these at home.   [MB]    Clinical Course User Index [MB] Hayden Rasmussen, MD  Final Clinical Impressions(s) / ED Diagnoses   Final diagnoses:  Hyperglycemia    ED Discharge Orders         Ordered    metFORMIN (GLUCOPHAGE) 500 MG tablet  2 times daily with meals     09/24/18 2052           Hayden Rasmussen, MD 09/25/18 1017

## 2018-09-24 NOTE — Discharge Instructions (Addendum)
You were seen in the emergency department for an elevated blood sugar.  You had some blood work that did not show any significant abnormalities other than the blood sugar being elevated.  You received some IV fluids here and a dose of metformin.  We will prescribe you Metformin to take twice a day and will be important for you to follow-up with your primary care doctor for further evaluation.  You should also monitor your blood sugar if you have access to testing.  Please return if any worsening symptoms.

## 2018-09-24 NOTE — ED Triage Notes (Signed)
Patient reports polydipsia, polyuria and high reading on her CBG.

## 2018-10-24 ENCOUNTER — Encounter (HOSPITAL_COMMUNITY): Payer: Self-pay | Admitting: Emergency Medicine

## 2018-10-24 ENCOUNTER — Observation Stay (HOSPITAL_COMMUNITY)
Admission: EM | Admit: 2018-10-24 | Discharge: 2018-10-26 | Disposition: A | Discharge status: Home / Self Care | Payer: BLUE CROSS/BLUE SHIELD | Attending: Family Medicine | Admitting: Family Medicine

## 2018-10-24 ENCOUNTER — Other Ambulatory Visit: Payer: Self-pay

## 2018-10-24 ENCOUNTER — Emergency Department (HOSPITAL_COMMUNITY): Payer: BLUE CROSS/BLUE SHIELD

## 2018-10-24 DIAGNOSIS — E1159 Type 2 diabetes mellitus with other circulatory complications: Secondary | ICD-10-CM | POA: Diagnosis present

## 2018-10-24 DIAGNOSIS — Z79899 Other long term (current) drug therapy: Secondary | ICD-10-CM | POA: Insufficient documentation

## 2018-10-24 DIAGNOSIS — R05 Cough: Secondary | ICD-10-CM | POA: Diagnosis present

## 2018-10-24 DIAGNOSIS — J40 Bronchitis, not specified as acute or chronic: Secondary | ICD-10-CM | POA: Diagnosis present

## 2018-10-24 DIAGNOSIS — J209 Acute bronchitis, unspecified: Secondary | ICD-10-CM | POA: Insufficient documentation

## 2018-10-24 DIAGNOSIS — E1310 Other specified diabetes mellitus with ketoacidosis without coma: Secondary | ICD-10-CM

## 2018-10-24 DIAGNOSIS — Z720 Tobacco use: Secondary | ICD-10-CM

## 2018-10-24 DIAGNOSIS — E1110 Type 2 diabetes mellitus with ketoacidosis without coma: Principal | ICD-10-CM

## 2018-10-24 DIAGNOSIS — F17210 Nicotine dependence, cigarettes, uncomplicated: Secondary | ICD-10-CM | POA: Insufficient documentation

## 2018-10-24 DIAGNOSIS — I10 Essential (primary) hypertension: Secondary | ICD-10-CM | POA: Diagnosis not present

## 2018-10-24 DIAGNOSIS — E876 Hypokalemia: Secondary | ICD-10-CM

## 2018-10-24 DIAGNOSIS — N179 Acute kidney failure, unspecified: Secondary | ICD-10-CM | POA: Diagnosis not present

## 2018-10-24 DIAGNOSIS — D573 Sickle-cell trait: Secondary | ICD-10-CM | POA: Insufficient documentation

## 2018-10-24 DIAGNOSIS — R0602 Shortness of breath: Secondary | ICD-10-CM | POA: Diagnosis present

## 2018-10-24 LAB — CBC
HCT: 52.6 % — ABNORMAL HIGH (ref 36.0–46.0)
HEMOGLOBIN: 17.7 g/dL — AB (ref 12.0–15.0)
MCH: 27.9 pg (ref 26.0–34.0)
MCHC: 33.7 g/dL (ref 30.0–36.0)
MCV: 83 fL (ref 80.0–100.0)
Platelets: 254 10*3/uL (ref 150–400)
RBC: 6.34 MIL/uL — ABNORMAL HIGH (ref 3.87–5.11)
RDW: 12.9 % (ref 11.5–15.5)
WBC: 6 10*3/uL (ref 4.0–10.5)
nRBC: 0 % (ref 0.0–0.2)

## 2018-10-24 LAB — URINALYSIS, ROUTINE W REFLEX MICROSCOPIC
Bacteria, UA: NONE SEEN
Bilirubin Urine: NEGATIVE
Glucose, UA: >=500 mg/dL — AB
Ketones, ur: 80 mg/dL — AB
LEUKOCYTES UA: NEGATIVE
Nitrite: NEGATIVE
Protein, ur: 100 mg/dL — AB
Specific Gravity, Urine: 1.016 (ref 1.005–1.030)
pH: 5 (ref 5.0–8.0)

## 2018-10-24 LAB — GLUCOSE, CAPILLARY
GLUCOSE-CAPILLARY: 205 mg/dL — AB (ref 70–99)
GLUCOSE-CAPILLARY: 334 mg/dL — AB (ref 70–99)
GLUCOSE-CAPILLARY: 406 mg/dL — AB (ref 70–99)
Glucose-Capillary: 252 mg/dL — ABNORMAL HIGH (ref 70–99)
Glucose-Capillary: 237 mg/dL — ABNORMAL HIGH (ref 70–99)
Glucose-Capillary: 232 mg/dL — ABNORMAL HIGH (ref 70–99)
Glucose-Capillary: 212 mg/dL — ABNORMAL HIGH (ref 70–99)
Glucose-Capillary: 222 mg/dL — ABNORMAL HIGH (ref 70–99)

## 2018-10-24 LAB — BASIC METABOLIC PANEL
Anion gap: >20 — ABNORMAL HIGH (ref 5–15)
Anion gap: >20 — ABNORMAL HIGH (ref 5–15)
Anion gap: 14 (ref 5–15)
BUN: 23 mg/dL — ABNORMAL HIGH (ref 6–20)
BUN: 20 mg/dL (ref 6–20)
BUN: 16 mg/dL (ref 6–20)
CO2: 15 mmol/L — ABNORMAL LOW (ref 22–32)
CO2: 10 mmol/L — ABNORMAL LOW (ref 22–32)
CO2: 13 mmol/L — ABNORMAL LOW (ref 22–32)
CREATININE: 0.72 mg/dL (ref 0.44–1.00)
Calcium: 10.3 mg/dL (ref 8.9–10.3)
Calcium: 8.4 mg/dL — ABNORMAL LOW (ref 8.9–10.3)
Calcium: 7.9 mg/dL — ABNORMAL LOW (ref 8.9–10.3)
Chloride: 92 mmol/L — ABNORMAL LOW (ref 98–111)
Chloride: 102 mmol/L (ref 98–111)
Chloride: 109 mmol/L (ref 98–111)
Creatinine, Ser: 1.16 mg/dL — ABNORMAL HIGH (ref 0.44–1.00)
Creatinine, Ser: 0.86 mg/dL (ref 0.44–1.00)
GFR calc Af Amer: >60 mL/min (ref >60)
GFR calc Af Amer: >60 mL/min (ref >60)
GFR calc Af Amer: >60 mL/min (ref >60)
GFR calc non Af Amer: 56 mL/min — ABNORMAL LOW (ref >60)
GFR calc non Af Amer: >60 mL/min (ref >60)
GFR calc non Af Amer: >60 mL/min (ref >60)
GLUCOSE: 483 mg/dL — AB (ref 70–99)
Glucose, Bld: 390 mg/dL — ABNORMAL HIGH (ref 70–99)
Glucose, Bld: 243 mg/dL — ABNORMAL HIGH (ref 70–99)
Potassium: 3.3 mmol/L — ABNORMAL LOW (ref 3.5–5.1)
Potassium: 2.7 mmol/L — CL (ref 3.5–5.1)
Potassium: 2.8 mmol/L — ABNORMAL LOW (ref 3.5–5.1)
SODIUM: 133 mmol/L — AB (ref 135–145)
Sodium: 133 mmol/L — ABNORMAL LOW (ref 135–145)
Sodium: 136 mmol/L (ref 135–145)

## 2018-10-24 LAB — TROPONIN I: Troponin I: <0.03 ng/mL (ref <0.03)

## 2018-10-24 LAB — HEMOGLOBIN A1C
HEMOGLOBIN A1C: 13.5 % — AB (ref 4.8–5.6)
Mean Plasma Glucose: 340.75 mg/dL

## 2018-10-24 LAB — CBG MONITORING, ED: Glucose-Capillary: 464 mg/dL — ABNORMAL HIGH (ref 70–99)

## 2018-10-24 LAB — MAGNESIUM: Magnesium: 1.6 mg/dL — ABNORMAL LOW (ref 1.7–2.4)

## 2018-10-24 MED ORDER — POTASSIUM CHLORIDE CRYS ER 20 MEQ PO TBCR: 40.0000 meq | EXTENDED_RELEASE_TABLET | Freq: Once | ORAL | Status: DC

## 2018-10-24 MED ORDER — INSULIN REGULAR(HUMAN) IN NACL 100-0.9 UT/100ML-% IV SOLN
INTRAVENOUS | Status: DC
  Administered 2018-10-24: 10.2 [IU]/h via INTRAVENOUS
  Administered 2018-10-24: 3.5 [IU]/h via INTRAVENOUS
  Filled 2018-10-24: qty 100

## 2018-10-24 MED ORDER — POTASSIUM CHLORIDE 20 MEQ/15ML (10%) PO SOLN
40.0000 meq | Freq: Once | ORAL | Status: AC
  Administered 2018-10-24: 40 meq via ORAL
  Filled 2018-10-24: qty 30

## 2018-10-24 MED ORDER — SENNOSIDES-DOCUSATE SODIUM 8.6-50 MG PO TABS
1.0000 | ORAL_TABLET | Freq: Every evening | ORAL | Status: DC | PRN
  Filled 2018-10-24: qty 1

## 2018-10-24 MED ORDER — POTASSIUM CHLORIDE CRYS ER 20 MEQ PO TBCR
40.0000 meq | EXTENDED_RELEASE_TABLET | Freq: Once | ORAL | Status: AC
  Administered 2018-10-24: 40 meq via ORAL
  Filled 2018-10-24: qty 2

## 2018-10-24 MED ORDER — ONDANSETRON HCL 4 MG PO TABS: 4.0000 mg | ORAL_TABLET | Freq: Four times a day (QID) | ORAL | Status: DC | PRN

## 2018-10-24 MED ORDER — GUAIFENESIN ER 600 MG PO TB12
1200.0000 mg | ORAL_TABLET | Freq: Two times a day (BID) | ORAL | Status: DC
  Administered 2018-10-25 – 2018-10-26 (×3): 1200 mg via ORAL
  Filled 2018-10-24 (×9): qty 2

## 2018-10-24 MED ORDER — SODIUM CHLORIDE 0.9% FLUSH: 3.0000 mL | Freq: Once | INTRAVENOUS | Status: DC

## 2018-10-24 MED ORDER — ACETAMINOPHEN 325 MG PO TABS: 650.0000 mg | ORAL_TABLET | Freq: Four times a day (QID) | ORAL | Status: DC | PRN

## 2018-10-24 MED ORDER — POTASSIUM CHLORIDE IN NACL 20-0.9 MEQ/L-% IV SOLN
INTRAVENOUS | Status: DC
  Administered 2018-10-24 – 2018-10-25 (×2): via INTRAVENOUS
  Filled 2018-10-24: qty 1000

## 2018-10-24 MED ORDER — SODIUM CHLORIDE 0.9 % IV SOLN
1000.0000 mL | INTRAVENOUS | Status: DC
  Administered 2018-10-24: 1000 mL via INTRAVENOUS

## 2018-10-24 MED ORDER — POTASSIUM CHLORIDE 10 MEQ/100ML IV SOLN
10.0000 meq | INTRAVENOUS | Status: AC
  Administered 2018-10-24 (×4): 10 meq via INTRAVENOUS
  Filled 2018-10-24 (×4): qty 100

## 2018-10-24 MED ORDER — DEXTROSE-NACL 5-0.45 % IV SOLN
INTRAVENOUS | Status: DC
  Administered 2018-10-24: 20:00:00 via INTRAVENOUS

## 2018-10-24 MED ORDER — ESCITALOPRAM OXALATE 10 MG PO TABS
20.0000 mg | ORAL_TABLET | Freq: Every day | ORAL | Status: DC
  Administered 2018-10-24 – 2018-10-26 (×3): 20 mg via ORAL
  Filled 2018-10-24 (×3): qty 2

## 2018-10-24 MED ORDER — POTASSIUM CHLORIDE 10 MEQ/100ML IV SOLN
10.0000 meq | INTRAVENOUS | Status: AC
  Administered 2018-10-24 (×2): 10 meq via INTRAVENOUS
  Filled 2018-10-24 (×2): qty 100

## 2018-10-24 MED ORDER — MAGNESIUM SULFATE 2 GM/50ML IV SOLN
2.0000 g | Freq: Once | INTRAVENOUS | Status: AC
  Administered 2018-10-24: 2 g via INTRAVENOUS
  Filled 2018-10-24: qty 50

## 2018-10-24 MED ORDER — AMLODIPINE BESYLATE 5 MG PO TABS
10.0000 mg | ORAL_TABLET | Freq: Every morning | ORAL | Status: DC
  Administered 2018-10-25 – 2018-10-26 (×2): 10 mg via ORAL
  Filled 2018-10-24 (×2): qty 2

## 2018-10-24 MED ORDER — SODIUM CHLORIDE 0.9% FLUSH
3.0000 mL | Freq: Two times a day (BID) | INTRAVENOUS | Status: DC
  Administered 2018-10-25 – 2018-10-26 (×2): 3 mL via INTRAVENOUS

## 2018-10-24 MED ORDER — SODIUM CHLORIDE 0.9 % IV BOLUS (SEPSIS)
1000.0000 mL | Freq: Once | INTRAVENOUS | Status: AC
  Administered 2018-10-24: 1000 mL via INTRAVENOUS

## 2018-10-24 MED ORDER — ONDANSETRON HCL 4 MG/2ML IJ SOLN: 4.0000 mg | Freq: Four times a day (QID) | INTRAMUSCULAR | Status: DC | PRN

## 2018-10-24 MED ORDER — ALBUTEROL SULFATE (2.5 MG/3ML) 0.083% IN NEBU: 2.5000 mg | INHALATION_SOLUTION | RESPIRATORY_TRACT | Status: DC | PRN

## 2018-10-24 MED ORDER — ENOXAPARIN SODIUM 40 MG/0.4ML ~~LOC~~ SOLN
40.0000 mg | SUBCUTANEOUS | Status: DC
  Administered 2018-10-24 – 2018-10-25 (×2): 40 mg via SUBCUTANEOUS
  Filled 2018-10-24 (×2): qty 0.4

## 2018-10-24 MED ORDER — MONTELUKAST SODIUM 10 MG PO TABS
10.0000 mg | ORAL_TABLET | Freq: Every day | ORAL | Status: DC
  Administered 2018-10-24 – 2018-10-25 (×2): 10 mg via ORAL
  Filled 2018-10-24 (×2): qty 1

## 2018-10-24 MED ORDER — SODIUM CHLORIDE 0.9 % IV SOLN: INTRAVENOUS | Status: DC

## 2018-10-24 MED ORDER — ACETAMINOPHEN 650 MG RE SUPP: 650.0000 mg | Freq: Four times a day (QID) | RECTAL | Status: DC | PRN

## 2018-10-24 NOTE — ED Notes (Addendum)
Date and time results received: 10/24/18 5:14 PM  (use smartphrase ".now" to insert current time)  Test: potassium Critical Value: 2.7  Name of Provider Notified: Posey Pronto, MD  Orders Received? Or Actions Taken?: Continue IV potassium runs

## 2018-10-24 NOTE — ED Notes (Signed)
Dr Posey Pronto called to unit and states if pt eating can d/c D5 1/2 NS, if pt not eating continue.

## 2018-10-24 NOTE — ED Provider Notes (Signed)
[facility] EMERGENCY DEPARTMENT Provider Note   CSN: [id number] Arrival date & time: 10/24/18  1150     History   Chief Complaint Chief Complaint  Patient presents with  . Chest Pain    HPI [name] is a 48 y.o. female.  The history is provided by the patient. No language interpreter was used.  Chest Pain  Pain location:  Epigastric Pain quality: aching   Pain radiates to:  Does not radiate Pain severity:  Moderate Onset quality:  Gradual Timing:  Constant Progression:  Worsening Chronicity:  New Relieved by:  Nothing Worsened by:  Nothing Ineffective treatments:  None tried Pt complains of cough and congestion  Pt reports she has been feeling weak. Pt reports no appetitie  Past Medical History:  Diagnosis Date  . Anemia   . Diabetes mellitus without complication ([place])   . Fibroids   . Hypertension   . Sickle cell trait [facility])     Patient Active Problem List   Diagnosis Date Noted  . S/P total abdominal hysterectomy 10/04/2015  . Excessive or frequent menstruation 07/20/2013  . OBESITY 01/07/2008  . NICOTINE ADDICTION 01/07/2008    Past Surgical History:  Procedure Laterality Date  . ABDOMINAL HYSTERECTOMY N/A 10/04/2015   Procedure: HYSTERECTOMY ABDOMINAL;  Surgeon: Jonnie Kind, MD;  Location: [facility];  Service: Gynecology;  Laterality: N/A;  . BILATERAL SALPINGECTOMY Bilateral 10/04/2015   Procedure: BILATERAL SALPINGECTOMY;  Surgeon: Jonnie Kind, MD;  Location: [facility];  Service: Gynecology;  Laterality: Bilateral;  . TUBAL LIGATION       OB History    Gravida  4   Para  4   Term  4   Preterm      AB      Living  4     SAB      TAB      Ectopic      Multiple      Live Births               Home Medications    Prior to Admission medications   Medication Sig Start Date End Date Taking? Authorizing Provider  amLODipine (NORVASC) 10 MG tablet Take 1 tablet (10 mg total) by mouth daily. 1 po daily at hs Patient  taking differently: Take 10 mg by mouth every morning.  09/28/17   Lily Kocher, PA-C  lisinopril-hydrochlorothiazide (PRINZIDE,ZESTORETIC) 10-12.5 MG tablet Take 1 tablet by mouth daily. 12/22/15   [provider]  metFORMIN (GLUCOPHAGE) 500 MG tablet Take 1 tablet (500 mg total) by mouth 2 (two) times daily with a meal. 09/24/18   Hayden Rasmussen, MD    Family History Family History  Problem Relation Age of Onset  . Diabetes Father   . Asthma Brother   . Hypertension Mother   . Allergic rhinitis Neg Hx   . Eczema Neg Hx   . Immunodeficiency Neg Hx   . Urticaria Neg Hx     Social History Social History   Tobacco Use  . Smoking status: Current Every Day Smoker    Packs/day: 0.50    Years: 28.00    Pack years: 14.00    Types: Cigarettes  . Smokeless tobacco: Never Used  Substance Use Topics  . Alcohol use: Yes    Comment: occ.  . Drug use: No     Allergies   Patient has no known allergies.   Review of Systems Review of Systems  Cardiovascular: Positive for chest pain.  All other systems reviewed and are negative.    Physical Exam Updated Vital Signs BP (!) 149/113 (BP Location: Right Arm)   Pulse 81   Temp 97.7 F (36.5 C) (Oral)   Resp 18   Ht 5\' 4"  (1.626 m)   Wt 99.8 kg   LMP 06/23/2015   SpO2 98%   BMI 37.76 kg/m   Physical Exam Vitals signs and nursing note reviewed.  Constitutional:      Appearance: She is well-developed.  HENT:     Head: Normocephalic.  Eyes:     Pupils: Pupils are equal, round, and reactive to light.  Neck:     Musculoskeletal: Normal range of motion.  Cardiovascular:     Rate and Rhythm: Normal rate.     Heart sounds: Normal heart sounds.  Pulmonary:     Effort: Pulmonary effort is normal.     Breath sounds: Normal breath sounds.  Abdominal:     Palpations: Abdomen is soft.  Musculoskeletal: Normal range of motion.  Skin:    General: Skin is warm.  Neurological:     General: No focal deficit present.      Mental Status: She is alert.  Psychiatric:        Mood and Affect: Mood normal.      ED Treatments / Results  Labs (all labs ordered are listed, but only abnormal results are displayed) Labs Reviewed  BASIC METABOLIC PANEL - Abnormal; Notable for the following components:      Result Value   Sodium 133 (*)    Potassium 3.3 (*)    Chloride 92 (*)    CO2 15 (*)    Glucose, Bld 483 (*)    BUN 23 (*)    Creatinine, Ser 1.16 (*)    GFR calc non Af Amer 56 (*)    Anion gap >20 (*)    All other components within normal limits  CBC - Abnormal; Notable for the following components:   RBC 6.34 (*)    Hemoglobin 17.7 (*)    HCT 52.6 (*)    All other components within normal limits  CBG MONITORING, ED - Abnormal; Notable for the following components:   Glucose-Capillary 464 (*)    All other components within normal limits  TROPONIN I  URINALYSIS, ROUTINE W REFLEX MICROSCOPIC    EKG EKG Interpretation  Date/Time:  Friday October 24 2018 12:40:32 EST Ventricular Rate:  83 PR Interval:  140 QRS Duration: 88 QT Interval:  398 QTC Calculation: 467 R Axis:   73 Text Interpretation:  Normal sinus rhythm Right atrial enlargement Non-specific ST-t changes Confirmed by Lajean Saver [phone]) on 10/24/2018 1:20:13 PM   Radiology Dg Chest 2 View  Result Date: 10/24/2018 CLINICAL DATA:  Chest pain with wheezing, productive cough, congestion and shortness of breath for 3 days. Fever today. History of asthma. EXAM: CHEST - 2 VIEW COMPARISON:  Radiographs 09/24/2018.  CT and radiographs 01/06/2016. FINDINGS: The heart size and mediastinal contours are stable. There is mild chronic central airway thickening without focal airspace disease, pleural effusion or pneumothorax. The bones appear stable with mild degenerative changes in the spine. IMPRESSION: Chronic central airway thickening attributed to reactive airways disease or bronchitis. No evidence of pneumonia. Electronically Signed   By:  Richardean Sale M.D.   On: 10/24/2018 13:26    Procedures Procedures (including critical care time)  Medications Ordered in ED Medications  sodium chloride flush (NS) 0.9 % injection 3 mL (3 mLs Intravenous Not Given  10/24/18 1334)  sodium chloride 0.9 % bolus 1,000 mL (1,000 mLs Intravenous New Bag/Given 10/24/18 1427)    Followed by  sodium chloride 0.9 % bolus 1,000 mL (1,000 mLs Intravenous New Bag/Given 10/24/18 1427)    Followed by  0.9 %  sodium chloride infusion (has no administration in time range)  0.9 %  sodium chloride infusion (has no administration in time range)  0.9 %  sodium chloride infusion (has no administration in time range)  dextrose 5 %-0.45 % sodium chloride infusion (has no administration in time range)  insulin regular, human (MYXREDLIN) 100 units/ 100 mL infusion (has no administration in time range)  potassium chloride 10 mEq in 100 mL IVPB (has no administration in time range)     Initial Impression / Assessment and Plan / ED Course  I have reviewed the triage vital signs and the nursing notes.  Pertinent labs & imaging results that were available during my care of the patient were reviewed by me and considered in my medical decision making (see chart for details).     MDM IV fluids started,  Chest xray normal   Labs returned Potassium is 3.3 glucose is 483 Bun and creatine are elevated CO2 15 and anion gap is 20.   Glucose stabilizer started with iv insulin   I spoke to hospitalist who will admit  Final Clinical Impressions(s) / ED Diagnoses   Final diagnoses:  Diabetic ketoacidosis without coma associated with other specified diabetes mellitus ([place])  Hypokalemia  Acute kidney injury [facility])    ED Discharge Orders    None       Sidney Ace 10/24/18 1518    Lajean Saver, MD 10/24/18 1523

## 2018-10-24 NOTE — H&P (Addendum)
History and Physical    [name] WIO:[id number] DOB: 10-23-1970 DOA: 10/24/2018  PCP: Tollie Eth, NP  Patient coming from: Home  I have personally briefly reviewed patient's old medical records in [place]  Chief Complaint: Cough, blood sugar issues  HPI: [name] is a 48 y.o. female with medical history significant for type 2 diabetes and hypertension who presents the ED with several weeks of cough productive of white sputum, shortness of breath, poor appetite and low oral intake, polyuria, polydipsia, and change in taste sensation.  She has felt as if her blood sugar has been fluctuating, but does not have equipment to check her sugars at home.  She says her blood sugars checked by her PCP who sent her to the ED as it was elevated.  She denies any typical chest pain, nausea, vomiting, abdominal pain, dysuria.  ED Course:  Initial vitals showed BP 149/113, pulse 81, RR 18, temp 97.7 Fahrenheit, SPO2 98% on room air.  Labs are notable for initial CBG 464, BUN 33, potassium 3.3, bicarb 15, BUN 23, creatinine 1.16, serum glucose 46, anion gap >20.  Urinalysis showed >500 glucose, 80 ketones, negative nitrites, negative leukocytes, no bacteria on microscopy.  2 view chest x-ray showed chronic central airway thickening suspected secondary to bronchitis or reactive airway disease.  Patient was given 2 L normal saline started on IV insulin infusion.  Was also given potassium supplement.  The hospitalist service was called to admit for further evaluation and management of DKA.  Review of Systems: As per HPI otherwise 10 point review of systems negative.    Past Medical History:  Diagnosis Date  . Anemia   . Diabetes mellitus without complication ([place])   . Fibroids   . Hypertension   . Sickle cell trait [facility])     Past Surgical History:  Procedure Laterality Date  . ABDOMINAL HYSTERECTOMY N/A 10/04/2015   Procedure: HYSTERECTOMY ABDOMINAL;  Surgeon: Jonnie Kind, MD;  Location: [facility];  Service: Gynecology;  Laterality: N/A;  . BILATERAL SALPINGECTOMY Bilateral 10/04/2015   Procedure: BILATERAL SALPINGECTOMY;  Surgeon: Jonnie Kind, MD;  Location: [facility];  Service: Gynecology;  Laterality: Bilateral;  . TUBAL LIGATION       reports that she has been smoking cigarettes. She has a 14.00 pack-year smoking history. She has never used smokeless tobacco. She reports current alcohol use. She reports that she does not use drugs.  No Known Allergies  Family History  Problem Relation Age of Onset  . Diabetes Father   . Asthma Brother   . Hypertension Mother   . Allergic rhinitis Neg Hx   . Eczema Neg Hx   . Immunodeficiency Neg Hx   . Urticaria Neg Hx      Prior to Admission medications   Medication Sig Start Date End Date Taking? Authorizing Provider  amLODipine (NORVASC) 10 MG tablet Take 1 tablet (10 mg total) by mouth daily. 1 po daily at hs Patient taking differently: Take 10 mg by mouth every morning.  09/28/17  Yes Lily Kocher, PA-C  atenolol-chlorthalidone (TENORETIC) 50-25 MG tablet Take 1 tablet by mouth daily.  10/15/18  Yes [provider]  escitalopram (LEXAPRO) 20 MG tablet Take 20 mg by mouth daily.  10/15/18  Yes [provider]  lisinopril-hydrochlorothiazide (PRINZIDE,ZESTORETIC) 10-12.5 MG tablet Take 1 tablet by mouth daily. 12/22/15  Yes [provider]  metFORMIN (GLUCOPHAGE) 500 MG tablet Take 1 tablet (500 mg total) by mouth 2 (two)  times daily with a meal. 09/24/18  Yes Hayden Rasmussen, MD  montelukast (SINGULAIR) 10 MG tablet Take 10 mg by mouth at bedtime.  07/27/18  Yes [provider]    Physical Exam: Vitals:   10/24/18 1238 10/24/18 1242  BP: (!) 149/113   Pulse: 81   Resp: 18   Temp: 97.7 F (36.5 C)   TempSrc: Oral   SpO2: 98%   Weight:  99.8 kg  Height:  5\' 4"  (1.626 m)    Constitutional: Obese woman resting supine in bed, NAD, calm, comfortable Eyes:  PERRL, lids and conjunctivae normal ENMT: Mucous membranes are dry. Posterior pharynx clear of any exudate or lesions. Normal dentition.  Neck: normal, supple, no masses. Respiratory: Upper airway wheezing otherwise clear to auscultation bilaterally. Normal respiratory effort. No accessory muscle use.  Intermittent coughing. Cardiovascular: Regular rate and rhythm, no murmurs / rubs / gallops. No extremity edema. 2+ pedal pulses. Abdomen: no tenderness, no masses palpated. No hepatosplenomegaly. Bowel sounds positive.  Musculoskeletal: no clubbing / cyanosis. No joint deformity upper and lower extremities. Good ROM, no contractures. Normal muscle tone.  Skin: no rashes, lesions, ulcers. No induration Neurologic: CN 2-12 grossly intact. Sensation intact, Strength 5/5 in all 4.  Psychiatric: Normal judgment and insight. Alert and oriented x 3. Normal mood.     Labs on Admission: I have personally reviewed following labs and imaging studies  CBC: Recent Labs  Lab 10/24/18 1300  WBC 6.0  HGB 17.7*  HCT 52.6*  MCV 83.0  PLT 130   Basic Metabolic Panel: Recent Labs  Lab 10/24/18 1300  NA 133*  K 3.3*  CL 92*  CO2 15*  GLUCOSE 483*  BUN 23*  CREATININE 1.16*  CALCIUM 10.3   GFR: Estimated Creatinine Clearance: 68.8 mL/min (A) (by C-G formula based on SCr of 1.16 mg/dL (H)). Liver Function Tests: No results for input(s): AST, ALT, ALKPHOS, BILITOT, PROT, ALBUMIN in the last 168 hours. No results for input(s): LIPASE, AMYLASE in the last 168 hours. No results for input(s): AMMONIA in the last 168 hours. Coagulation Profile: No results for input(s): INR, PROTIME in the last 168 hours. Cardiac Enzymes: Recent Labs  Lab 10/24/18 1300  TROPONINI <0.03   BNP (last 3 results) No results for input(s): PROBNP in the last 8760 hours. HbA1C: No results for input(s): HGBA1C in the last 72 hours. CBG: Recent Labs  Lab 10/24/18 1241 10/24/18 1514  GLUCAP 464* 406*   Lipid  Profile: No results for input(s): CHOL, HDL, LDLCALC, TRIG, CHOLHDL, LDLDIRECT in the last 72 hours. Thyroid Function Tests: No results for input(s): TSH, T4TOTAL, FREET4, T3FREE, THYROIDAB in the last 72 hours. Anemia Panel: No results for input(s): VITAMINB12, FOLATE, FERRITIN, TIBC, IRON, RETICCTPCT in the last 72 hours. Urine analysis:    Component Value Date/Time   COLORURINE STRAW (A) 10/24/2018 1410   APPEARANCEUR CLEAR 10/24/2018 1410   LABSPEC 1.016 10/24/2018 1410   PHURINE 5.0 10/24/2018 1410   GLUCOSEU >=500 (A) 10/24/2018 1410   HGBUR MODERATE (A) 10/24/2018 1410   BILIRUBINUR NEGATIVE 10/24/2018 1410   KETONESUR 80 (A) 10/24/2018 1410   PROTEINUR 100 (A) 10/24/2018 1410   UROBILINOGEN 0.2 05/12/2013 1039   NITRITE NEGATIVE 10/24/2018 1410   LEUKOCYTESUR NEGATIVE 10/24/2018 1410    Radiological Exams on Admission: Dg Chest 2 View  Result Date: 10/24/2018 CLINICAL DATA:  Chest pain with wheezing, productive cough, congestion and shortness of breath for 3 days. Fever today. History of asthma. EXAM: CHEST -  2 VIEW COMPARISON:  Radiographs 09/24/2018.  CT and radiographs 01/06/2016. FINDINGS: The heart size and mediastinal contours are stable. There is mild chronic central airway thickening without focal airspace disease, pleural effusion or pneumothorax. The bones appear stable with mild degenerative changes in the spine. IMPRESSION: Chronic central airway thickening attributed to reactive airways disease or bronchitis. No evidence of pneumonia. Electronically Signed   By: Richardean Sale M.D.   On: 10/24/2018 13:26    EKG: Independently reviewed.  Sinus rhythm, nonspecific T wave changes inferolateral leads.  Assessment/Plan Principal Problem:   DKA (diabetic ketoacidoses) (HCC) Active Problems:   Tobacco use   Hypertension associated with diabetes ([place])   AKI (acute kidney injury) ([place])   Hypokalemia   Bronchitis  [name] is a 48 y.o. female with medical  history significant for type 2 diabetes and hypertension who is admitted with DKA.   T2DM with DKA: On metformin 500 twice a day as an outpatient.  Started on insulin infusion and DKA protocol. -Continue IV insulin and fluids per protocol -Transition to 10 units Lantus plus sensitive SSI when gap closed x2 -Monitor BMET q4h -Check A1c  AKI: Mild AKI secondary to above poor oral intake. -Continue IV fluids and recheck in a.m.  Bronchitis: In the setting of tobacco use. -Start Mucinex  Hypertension: Hypertensive on admission.  Restart home amlodipine.  Holding lisinopril-HCTZ with mild AKI. Also listed as taking atenolol-chlorthalidone, unclear which thiazide combination she is taking or if taking both.  Hypokalemia: -Repleting, monitor BMET  Tobacco use: Smoking 0.5 PPD. -Counseled on tobacco cessation  DVT prophylaxis: Lovenox Code Status: Full code Family Communication: None present at bedside admission Disposition Plan: Pending continued management of DKA Consults called: None Admission status: Observation   Zada Finders MD [facility] Pager [phone]  If 7PM-7AM, please contact night-coverage [email or web address]  10/24/2018, 3:57 PM

## 2018-10-24 NOTE — ED Triage Notes (Signed)
Patient reports chest pain and SOB, loss of appetite and weakness for a week and a half.

## 2018-10-25 ENCOUNTER — Other Ambulatory Visit: Payer: Self-pay

## 2018-10-25 DIAGNOSIS — N179 Acute kidney failure, unspecified: Secondary | ICD-10-CM

## 2018-10-25 DIAGNOSIS — E1159 Type 2 diabetes mellitus with other circulatory complications: Secondary | ICD-10-CM | POA: Diagnosis not present

## 2018-10-25 DIAGNOSIS — J40 Bronchitis, not specified as acute or chronic: Secondary | ICD-10-CM

## 2018-10-25 DIAGNOSIS — E1110 Type 2 diabetes mellitus with ketoacidosis without coma: Secondary | ICD-10-CM | POA: Diagnosis present

## 2018-10-25 DIAGNOSIS — I10 Essential (primary) hypertension: Secondary | ICD-10-CM

## 2018-10-25 DIAGNOSIS — E876 Hypokalemia: Secondary | ICD-10-CM

## 2018-10-25 DIAGNOSIS — Z720 Tobacco use: Secondary | ICD-10-CM

## 2018-10-25 LAB — BASIC METABOLIC PANEL
Anion gap: 8 (ref 5–15)
BUN: 10 mg/dL (ref 6–20)
CO2: 19 mmol/L — ABNORMAL LOW (ref 22–32)
Calcium: 8 mg/dL — ABNORMAL LOW (ref 8.9–10.3)
Chloride: 113 mmol/L — ABNORMAL HIGH (ref 98–111)
Creatinine, Ser: 0.53 mg/dL (ref 0.44–1.00)
GFR calc Af Amer: >60 mL/min (ref >60)
GFR calc non Af Amer: >60 mL/min (ref >60)
Glucose, Bld: 130 mg/dL — ABNORMAL HIGH (ref 70–99)
Potassium: 3.3 mmol/L — ABNORMAL LOW (ref 3.5–5.1)
SODIUM: 140 mmol/L (ref 135–145)

## 2018-10-25 LAB — GLUCOSE, CAPILLARY
GLUCOSE-CAPILLARY: 189 mg/dL — AB (ref 70–99)
GLUCOSE-CAPILLARY: 189 mg/dL — AB (ref 70–99)
GLUCOSE-CAPILLARY: 136 mg/dL — AB (ref 70–99)
Glucose-Capillary: 166 mg/dL — ABNORMAL HIGH (ref 70–99)
Glucose-Capillary: 145 mg/dL — ABNORMAL HIGH (ref 70–99)
Glucose-Capillary: 114 mg/dL — ABNORMAL HIGH (ref 70–99)
Glucose-Capillary: 110 mg/dL — ABNORMAL HIGH (ref 70–99)
Glucose-Capillary: 126 mg/dL — ABNORMAL HIGH (ref 70–99)
Glucose-Capillary: 405 mg/dL — ABNORMAL HIGH (ref 70–99)
Glucose-Capillary: 286 mg/dL — ABNORMAL HIGH (ref 70–99)

## 2018-10-25 LAB — CBC
HCT: 40.7 % (ref 36.0–46.0)
HEMOGLOBIN: 14 g/dL (ref 12.0–15.0)
MCH: 28.5 pg (ref 26.0–34.0)
MCHC: 34.4 g/dL (ref 30.0–36.0)
MCV: 82.7 fL (ref 80.0–100.0)
Platelets: 107 10*3/uL — ABNORMAL LOW (ref 150–400)
RBC: 4.92 MIL/uL (ref 3.87–5.11)
RDW: 12.4 % (ref 11.5–15.5)
WBC: 4.2 10*3/uL (ref 4.0–10.5)
nRBC: 0 % (ref 0.0–0.2)

## 2018-10-25 LAB — HIV ANTIBODY (ROUTINE TESTING W REFLEX): HIV Screen 4th Generation wRfx: NONREACTIVE

## 2018-10-25 LAB — GLUCOSE, RANDOM: GLUCOSE: 410 mg/dL — AB (ref 70–99)

## 2018-10-25 LAB — MRSA PCR SCREENING: MRSA by PCR: NEGATIVE

## 2018-10-25 LAB — MAGNESIUM: Magnesium: 2.3 mg/dL (ref 1.7–2.4)

## 2018-10-25 MED ORDER — INSULIN ASPART 100 UNIT/ML ~~LOC~~ SOLN
0.0000 [IU] | Freq: Every day | SUBCUTANEOUS | Status: DC
  Administered 2018-10-25: 10 [IU] via SUBCUTANEOUS

## 2018-10-25 MED ORDER — POTASSIUM CHLORIDE CRYS ER 20 MEQ PO TBCR
40.0000 meq | EXTENDED_RELEASE_TABLET | Freq: Once | ORAL | Status: AC
  Administered 2018-10-25: 40 meq via ORAL
  Filled 2018-10-25: qty 2

## 2018-10-25 MED ORDER — LIVING WELL WITH DIABETES BOOK
Freq: Once | Status: AC
  Administered 2018-10-25: 09:00:00

## 2018-10-25 MED ORDER — INSULIN ASPART 100 UNIT/ML ~~LOC~~ SOLN
15.0000 [IU] | Freq: Three times a day (TID) | SUBCUTANEOUS | Status: DC
  Administered 2018-10-25: 15 [IU] via SUBCUTANEOUS

## 2018-10-25 MED ORDER — INSULIN GLARGINE 100 UNIT/ML ~~LOC~~ SOLN
45.0000 [IU] | SUBCUTANEOUS | Status: DC
  Administered 2018-10-25: 45 [IU] via SUBCUTANEOUS
  Filled 2018-10-25 (×3): qty 0.45

## 2018-10-25 MED ORDER — SODIUM CHLORIDE 0.9 % IV SOLN
INTRAVENOUS | Status: DC
  Administered 2018-10-25: 12:00:00 via INTRAVENOUS

## 2018-10-25 MED ORDER — ENSURE ENLIVE PO LIQD
237.0000 mL | Freq: Two times a day (BID) | ORAL | Status: DC
  Administered 2018-10-25: 237 mL via ORAL

## 2018-10-25 MED ORDER — INSULIN STARTER KIT- PEN NEEDLES (ENGLISH)
1.0000 | Freq: Once | Status: AC
  Administered 2018-10-25: 1
  Filled 2018-10-25: qty 1

## 2018-10-25 MED ORDER — INSULIN GLARGINE 100 UNIT/ML ~~LOC~~ SOLN
55.0000 [IU] | SUBCUTANEOUS | Status: DC
  Filled 2018-10-25 (×2): qty 0.55

## 2018-10-25 MED ORDER — INSULIN ASPART 100 UNIT/ML ~~LOC~~ SOLN
20.0000 [IU] | Freq: Three times a day (TID) | SUBCUTANEOUS | Status: DC
  Administered 2018-10-25 – 2018-10-26 (×3): 20 [IU] via SUBCUTANEOUS

## 2018-10-25 MED ORDER — ENSURE MAX PROTEIN PO LIQD
11.0000 [oz_av] | Freq: Every day | ORAL | Status: DC
  Administered 2018-10-25: 11 [oz_av] via ORAL

## 2018-10-25 MED ORDER — INSULIN ASPART 100 UNIT/ML ~~LOC~~ SOLN
0.0000 [IU] | Freq: Three times a day (TID) | SUBCUTANEOUS | Status: DC
  Administered 2018-10-25: 11 [IU] via SUBCUTANEOUS
  Administered 2018-10-25: 15 [IU] via SUBCUTANEOUS

## 2018-10-25 MED ORDER — INSULIN ASPART 100 UNIT/ML ~~LOC~~ SOLN
0.0000 [IU] | Freq: Three times a day (TID) | SUBCUTANEOUS | Status: DC
  Administered 2018-10-26: 11 [IU] via SUBCUTANEOUS
  Administered 2018-10-26: 7 [IU] via SUBCUTANEOUS

## 2018-10-25 NOTE — Progress Notes (Signed)
Initial Nutrition Assessment  DOCUMENTATION CODES:  Obesity unspecified  INTERVENTION:  D/C ensure enlive. +Ensure Max protein supplement Q24 hrs, each supplement provides 150kcal and 30g of protein.  IF still admitted next week, RD to f/u for in person diet education.   If discharges, Recommend ambulatory referral to [facility]    NUTRITION DIAGNOSIS:  Inadequate oral intake related to poor appetite, acute illness(AKI, DKA) as evidenced by per pts report  GOAL:  Patient will meet greater than or equal to 90% of their needs  MONITOR:  PO intake, Supplement acceptance, Diet advancement, Weight trends, Labs, I & O's  REASON FOR ASSESSMENT:  Malnutrition Screening Tool    ASSESSMENT:  48 y/o female PMHx DM2, HTN. Presents to ED w/ several weeks of cough, SOB, poor appetite, polyuria, polydipsia and dysgeusia. Does not have equipment to check her sugars at home. Sent to ED by PCP after BG was checked. Pt found to be in DKA and admitted on insulin gtt.   RD operating remotely on weekends. Pt had reported poor intake due to decreased appetite as well as unintentional weight loss on MST.   Per review of chart, pt has been ill for several weeks, potentially due to uncontrolled BG, dehydration and AKI. Her a1c was 13.5. DM coordinator educated pt on insulin administration earlier today. Pt is apparently newly on insulin.   Weight wise, pt appears to have stayed between 210-220 for past several years. She currently is 201 lbs. Wt loss is likely acute, in setting of uncontrolled BG and acute illness.   At this time, there is no information in chart regarding pt intake. Will preemptively order oral supplements. If pt still in hospital on Monday, RD will see to give handouts and provide diet teaching. If discharges, recommend OP referral for diet education.   Labs: A1C: 13.5, Bgs: 110-130, K:3.3,  Meds: Ensure Enlive, IVF, Insulin gtt  Recent Labs  Lab  10/24/18 1300 10/24/18 1601 10/24/18 1938 10/25/18 0439  NA 133* 133* 136 140  K 3.3* 2.7* 2.8* 3.3*  CL 92* 102 109 113*  CO2 15* 10* 13* 19*  BUN 23* 20 16 10   CREATININE 1.16* 0.86 0.72 0.53  CALCIUM 10.3 8.4* 7.9* 8.0*  MG 1.6*  --   --  2.3  GLUCOSE 483* 390* 243* 130*   NUTRITION - FOCUSED PHYSICAL EXAM: Unable to conduct  Diet Order:   Diet Order            Diet heart healthy/carb modified Room service appropriate? Yes; Fluid consistency: Thin  Diet effective now             EDUCATION NEEDS:  Not appropriate for education at this time  Skin:  Skin Assessment: Reviewed RN Assessment  Last BM:  2/8  Height:  Ht Readings from Last 1 Encounters:  10/24/18 5\' 4"  (1.626 m)   Weight:  Wt Readings from Last 1 Encounters:  10/25/18 91.1 kg   Wt Readings from Last 10 Encounters:  10/25/18 91.1 kg  09/24/18 90.7 kg  09/28/17 99.8 kg  01/06/16 99.8 kg  11/04/15 96.2 kg  10/14/15 96.2 kg  10/11/15 95.3 kg  10/05/15 98 kg  09/23/15 98.2 kg  09/21/15 98 kg   Ideal Body Weight:  54.54 kg  BMI:  Body mass index is 34.47 kg/m.  Estimated Nutritional Needs:  Kcal:  1650-1800 (18-20 kcal/kg bw) Protein:  71-82g Pro (1.3-1.5g/kg ibw) Fluid:  1.7-1.8 L fluid (1 ml/kcal)  Burtis Junes  RD, LDN, CNSC Clinical Nutrition Available Tues-Sat via Pager: [phone] 10/25/2018 2:25 PM

## 2018-10-25 NOTE — Progress Notes (Addendum)
Inpatient Diabetes Program Recommendations  AACE/ADA: New Consensus Statement on Inpatient Glycemic Control (2015)  Target Ranges:  Prepandial:   less than 140 mg/dL      Peak postprandial:   less than 180 mg/dL (1-2 hours)      Critically ill patients:  140 - 180 mg/dL   Lab Results  Component Value Date   GLUCAP 136 (H) 10/25/2018   HGBA1C 13.5 (H) 10/24/2018    Review of Glycemic ControlResults for AMARRIA, ANDREASEN (MRN [id number]) as of 10/25/2018 09:23  Ref. Range 10/25/2018 06:29 10/25/2018 07:30  Glucose-Capillary Latest Ref Range: 70 - 99 mg/dL 126 (H) 136 (H)    Diabetes history: DM 2 Outpatient Diabetes medications:  Metformin 500 mg bid Current orders for Inpatient glycemic control:  Lantus 45 units daily, Novolog 15 units tid with meals, Novolog moderate tid with meals   Inpatient Diabetes Program Recommendations:    Note patient is new to insulin. A1C indicates very poor control of DM.  She will need insulin at d/c.  Will ask RN's to begin insulin teaching with insulin pens and have patient watch DM videos.  Diabetes coordinator will also call patient by phone to discuss insulin and A1C.    Thanks  Adah Perl, RN, BC-ADM Inpatient Diabetes Coordinator Pager [phone] (8a-5p)   11:30- Spoke with RN.  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  She uses insulin pens as well at work.  Spoke with patient by phone.  Discussed A1C results of 13.9% which indicates an average blood sugar of 370 mg/dL.  She endorses symptoms of fatigue, thirst and frequent urination.  She states that her she plans to f.u with PCP next week.  We discussed goal A1C of 7% and that she needs insulin due to her body not producing enough insulin to keep up with her needs.  Patient verbalized understanding.  Instructed her to check blood sugars at least 4 times a day.  We also discussed signs and symptoms of low blood sugars (silly, shaky, sweaty) and treatment.   We discussed treatment with 4 oz of juice and possibly a f/u snack.  Patient would benefit from outpatient education as well. Patient was able to teach back information to me.  RN is doing a great job with teaching as well.  No further needs indicated at this time.

## 2018-10-25 NOTE — Progress Notes (Signed)
PROGRESS NOTE    [name]  VVO:[id number]  DOB: 1971-08-16  DOA: 10/24/2018 PCP: Tollie Eth, NP   Brief Admission Hx: 33 y / o female presented with hyperglycemia, severe dehydration, URI and acute DKA with A1c>13%.    MDM/Assessment & Plan:   1. DKA - Anion gap has closed and patient is feeling much better, Will transition to basal bolus insulin with supplemental sliding scale coverage.  She will need diabetes education and insulin administration education which the ICU RN has started.  The patient will likely be discharged on insulin.   2. Hypokalemia - oral replacement ordered, follow BMP and magnesium.   3. AKI - improved with IV fluid hydration.   4. Acute bronchitis - likely viral and seems to be improving.  5. Essential hypertension - restarted home amlodipine.   6. Tobacco - patient strongly advised to discontinue all tobacco use.    DVT prophylaxis: lovenox  Code Status: full  Family Communication: patient  Disposition Plan: home tomorrow if stable    Consultants:  diabeted coordinators  Procedures:  n/a  Antimicrobials:  n/a  Subjective: Pt says she is starting to feel better, she is tolerating breakfast so far.    Objective: Vitals:   10/25/18 0530 10/25/18 0545 10/25/18 0600 10/25/18 0755  BP: (!) 120/108 136/72 (!) 135/95   Pulse: (!) 57 (!) 57 65   Resp:      Temp:    98.4 F (36.9 C)  TempSrc:    Oral  SpO2: 100% 99% 99%   Weight:      Height:        Intake/Output Summary (Last 24 hours) at 10/25/2018 0856 Last data filed at 10/25/2018 0606 Gross per 24 hour  Intake 5431.36 ml  Output -  Net 5431.36 ml   Filed Weights   10/24/18 1242 10/25/18 0500  Weight: 99.8 kg 91.1 kg   REVIEW OF SYSTEMS  As per history otherwise all reviewed and reported negative  Exam:  General exam: awake, alert, NAD, cooperative, NAD, eating.  Respiratory system: Clear. No increased work of breathing. Cardiovascular system: S1 & S2 heard. No  JVD, murmurs, gallops, clicks or pedal edema. Gastrointestinal system: Abdomen is nondistended, soft and nontender. Normal bowel sounds heard. Central nervous system: Alert and oriented. No focal neurological deficits. Extremities: no cyanosis or clubbing.  Data Reviewed: Basic Metabolic Panel: Recent Labs  Lab 10/24/18 1300 10/24/18 1601 10/24/18 1938 10/25/18 0439  NA 133* 133* 136 140  K 3.3* 2.7* 2.8* 3.3*  CL 92* 102 109 113*  CO2 15* 10* 13* 19*  GLUCOSE 483* 390* 243* 130*  BUN 23* '20 16 10  '$ CREATININE 1.16* 0.86 0.72 0.53  CALCIUM 10.3 8.4* 7.9* 8.0*  MG 1.6*  --   --  2.3   Liver Function Tests: No results for input(s): AST, ALT, ALKPHOS, BILITOT, PROT, ALBUMIN in the last 168 hours. No results for input(s): LIPASE, AMYLASE in the last 168 hours. No results for input(s): AMMONIA in the last 168 hours. CBC: Recent Labs  Lab 10/24/18 1300 10/25/18 0439  WBC 6.0 4.2  HGB 17.7* 14.0  HCT 52.6* 40.7  MCV 83.0 82.7  PLT 254 107*   Cardiac Enzymes: Recent Labs  Lab 10/24/18 1300  TROPONINI <0.03   CBG (last 3)  Recent Labs    10/25/18 0627 10/25/18 0629 10/25/18 0730  GLUCAP 110* 126* 136*   No results found for this or any previous visit (from the past 240 hour(s)).  Studies: Dg Chest 2 View  Result Date: 10/24/2018 CLINICAL DATA:  Chest pain with wheezing, productive cough, congestion and shortness of breath for 3 days. Fever today. History of asthma. EXAM: CHEST - 2 VIEW COMPARISON:  Radiographs 09/24/2018.  CT and radiographs 01/06/2016. FINDINGS: The heart size and mediastinal contours are stable. There is mild chronic central airway thickening without focal airspace disease, pleural effusion or pneumothorax. The bones appear stable with mild degenerative changes in the spine. IMPRESSION: Chronic central airway thickening attributed to reactive airways disease or bronchitis. No evidence of pneumonia. Electronically Signed   By: Richardean Sale M.D.    On: 10/24/2018 13:26   Scheduled Meds: . amLODipine  10 mg Oral q morning - 10a  . enoxaparin (LOVENOX) injection  40 mg Subcutaneous Q24H  . escitalopram  20 mg Oral Daily  . feeding supplement (ENSURE ENLIVE)  237 mL Oral BID BM  . guaiFENesin  1,200 mg Oral BID  . insulin aspart  0-15 Units Subcutaneous TID WC  . insulin aspart  15 Units Subcutaneous TID WC  . insulin glargine  45 Units Subcutaneous Q24H  . insulin starter kit- pen needles  1 kit Other Once  . living well with diabetes book   Does not apply Once  . montelukast  10 mg Oral QHS  . potassium chloride  40 mEq Oral Once  . sodium chloride flush  3 mL Intravenous Once  . sodium chloride flush  3 mL Intravenous Q12H   Continuous Infusions: . sodium chloride    . insulin 3.9 mL/hr at 10/25/18 0606    Principal Problem:   DKA (diabetic ketoacidoses) ([place]) Active Problems:   Tobacco use   Hypertension associated with diabetes ([place])   AKI (acute kidney injury) ([place])   Hypokalemia   Bronchitis   Critical care Time spent: 31 mins  Irwin Brakeman, MD [facility] 10/25/2018, 8:56 AM    LOS: 0 days  How to contact the [facility] Attending or Consulting provider [place] or covering provider during after hours [place], for this patient?  1. Check the care team in [facility] and look for a) attending/consulting TR[facility] provider listed and b) the [facility] team listed 2. Log into [email or web address] and use [place]'s universal password to access. If you do not have the password, please contact the hospital operator. 3. Locate the [facility] provider you are looking for under [facility] and page to a number that you can be directly reached. 4. If you still have difficulty reaching the provider, please page the [facility] (Director on Call) for the Hospitalists listed on amion for assistance.

## 2018-10-26 DIAGNOSIS — N179 Acute kidney failure, unspecified: Secondary | ICD-10-CM | POA: Diagnosis not present

## 2018-10-26 DIAGNOSIS — E1159 Type 2 diabetes mellitus with other circulatory complications: Secondary | ICD-10-CM | POA: Diagnosis not present

## 2018-10-26 DIAGNOSIS — J40 Bronchitis, not specified as acute or chronic: Secondary | ICD-10-CM | POA: Diagnosis not present

## 2018-10-26 DIAGNOSIS — E1110 Type 2 diabetes mellitus with ketoacidosis without coma: Secondary | ICD-10-CM | POA: Diagnosis not present

## 2018-10-26 LAB — BASIC METABOLIC PANEL
Anion gap: 13 (ref 5–15)
BUN: 7 mg/dL (ref 6–20)
CO2: 23 mmol/L (ref 22–32)
Calcium: 8.1 mg/dL — ABNORMAL LOW (ref 8.9–10.3)
Chloride: 102 mmol/L (ref 98–111)
Creatinine, Ser: 0.47 mg/dL (ref 0.44–1.00)
GFR calc Af Amer: >60 mL/min (ref >60)
GFR calc non Af Amer: >60 mL/min (ref >60)
Glucose, Bld: 266 mg/dL — ABNORMAL HIGH (ref 70–99)
Potassium: 2.8 mmol/L — ABNORMAL LOW (ref 3.5–5.1)
Sodium: 138 mmol/L (ref 135–145)

## 2018-10-26 LAB — GLUCOSE, CAPILLARY
Glucose-Capillary: 202 mg/dL — ABNORMAL HIGH (ref 70–99)
Glucose-Capillary: 242 mg/dL — ABNORMAL HIGH (ref 70–99)
Glucose-Capillary: 255 mg/dL — ABNORMAL HIGH (ref 70–99)

## 2018-10-26 LAB — MAGNESIUM: Magnesium: 1.9 mg/dL (ref 1.7–2.4)

## 2018-10-26 MED ORDER — PHENOL 1.4 % MT LIQD
1.0000 | OROMUCOSAL | Status: DC | PRN
  Filled 2018-10-26: qty 177

## 2018-10-26 MED ORDER — POTASSIUM CHLORIDE ER 10 MEQ PO TBCR: 10.0000 meq | EXTENDED_RELEASE_TABLET | Freq: Every day | ORAL | 0 refills | Status: DC

## 2018-10-26 MED ORDER — INSULIN PEN NEEDLE 31G X 5 MM MISC: 1.0000 | 0 refills | Status: DC

## 2018-10-26 MED ORDER — INSULIN ASPART 100 UNIT/ML FLEXPEN: 15.0000 [IU] | PEN_INJECTOR | Freq: Three times a day (TID) | SUBCUTANEOUS | 11 refills | Status: DC

## 2018-10-26 MED ORDER — AMLODIPINE BESYLATE 10 MG PO TABS: 10.0000 mg | ORAL_TABLET | Freq: Every morning | ORAL | Status: AC

## 2018-10-26 MED ORDER — BLOOD GLUCOSE METER KIT: PACK | 0 refills | Status: DC

## 2018-10-26 MED ORDER — METFORMIN HCL ER 500 MG PO TB24: 1000.0000 mg | ORAL_TABLET | Freq: Two times a day (BID) | ORAL | 0 refills | Status: AC

## 2018-10-26 MED ORDER — INSULIN GLARGINE 100 UNIT/ML SOLOSTAR PEN: 45.0000 [IU] | PEN_INJECTOR | Freq: Every day | SUBCUTANEOUS | 0 refills | Status: AC

## 2018-10-26 MED ORDER — POTASSIUM CHLORIDE CRYS ER 20 MEQ PO TBCR
60.0000 meq | EXTENDED_RELEASE_TABLET | Freq: Once | ORAL | Status: AC
  Administered 2018-10-26: 60 meq via ORAL
  Filled 2018-10-26: qty 3

## 2018-10-26 MED ORDER — INSULIN GLARGINE 100 UNIT/ML ~~LOC~~ SOLN
60.0000 [IU] | SUBCUTANEOUS | Status: DC
  Administered 2018-10-26: 60 [IU] via SUBCUTANEOUS
  Filled 2018-10-26 (×2): qty 0.6

## 2018-10-26 NOTE — Progress Notes (Signed)
Patient discharged to home taken to car via wheelchair.  Patient demonstrated how to use insulin pen.

## 2018-10-26 NOTE — Discharge Instructions (Signed)
PLEASE MONITOR BLOOD SUGAR 4 TIMES PER DAY AND REPORT READINGS TO YOUR PRIMARY CARE PROVIDER.  PLEASE CALL YOUR PRIMARY CARE PROVIDER FOR ANY BLOOD SUGAR READINGS LESS THAN 80.     IMPORTANT INFORMATION: PAY CLOSE ATTENTION   PHYSICIAN DISCHARGE INSTRUCTIONS  Follow with Primary care provider  Tollie Eth, NP  and other consultants as instructed your Hospitalist Physician  SEEK MEDICAL CARE OR RETURN TO EMERGENCY ROOM IF SYMPTOMS COME BACK, WORSEN OR NEW PROBLEM DEVELOPS.   Please note: You were cared for by a hospitalist during your hospital stay. Every effort will be made to forward records to your primary care provider.  You can request that your primary care provider send for your hospital records if they have not received them.  Once you are discharged, your primary care physician will handle any further medical issues. Please note that NO REFILLS for any discharge medications will be authorized once you are discharged, as it is imperative that you return to your primary care physician (or establish a relationship with a primary care physician if you do not have one) for your post hospital discharge needs so that they can reassess your need for medications and monitor your lab values.  Please get a complete blood count and chemistry panel checked by your Primary MD at your next visit, and again as instructed by your Primary MD.  Get Medicines reviewed and adjusted: Please take all your medications with you for your next visit with your Primary MD  Laboratory/radiological data: Please request your Primary MD to go over all hospital tests and procedure/radiological results at the follow up, please ask your primary care provider to get all Hospital records sent to his/her office.  In some cases, they will be blood work, cultures and biopsy results pending at the time of your discharge. Please request that your primary care provider follow up on these results.  If you are diabetic,  please bring your blood sugar readings with you to your follow up appointment with primary care.    Please call and make your follow up appointments as soon as possible.    Also Note the following: If you experience worsening of your admission symptoms, develop shortness of breath, life threatening emergency, suicidal or homicidal thoughts you must seek medical attention immediately by calling 911 or calling your MD immediately  if symptoms less severe.  You must read complete instructions/literature along with all the possible adverse reactions/side effects for all the Medicines you take and that have been prescribed to you. Take any new Medicines after you have completely understood and accpet all the possible adverse reactions/side effects.   Do not drive when taking Pain medications or sleeping medications (Benzodiazepines)  Do not take more than prescribed Pain, Sleep and Anxiety Medications. It is not advisable to combine anxiety,sleep and pain medications without talking with your primary care practitioner  Special Instructions: If you have smoked or chewed Tobacco  in the last 2 yrs please stop smoking, stop any regular Alcohol  and or any Recreational drug use.  Wear Seat belts while driving.

## 2018-10-26 NOTE — Discharge Summary (Addendum)
Physician Discharge Summary  [name] DDU:[id number] DOB: [birth year] DOA: 10/24/2018  PCP: Tollie Eth, NP  Admit date: 10/24/2018 Discharge date: 10/26/2018  Admitted From: HOME  Disposition: HOME   Recommendations for Outpatient Follow-up:  1. Follow up with PCP in 3 days for recheck 2. Establish care with Dr Dorris Fetch endocrinologist in 2 weeks 3. Outpatient diabetes education.  4. Pt needs to see optometry/opthalmology for diabetes eye exam asap.   Discharge Condition: STABLE   CODE STATUS: FULL    Brief Hospitalization Summary: Please see all hospital notes, images, labs for full details of the hospitalization. Dr. Serita Grit HPI: [name] is a 48 y.o. female with medical history significant for type 2 diabetes and hypertension who presents the ED with several weeks of cough productive of white sputum, shortness of breath, poor appetite and low oral intake, polyuria, polydipsia, and change in taste sensation.  She has felt as if her blood sugar has been fluctuating, but does not have equipment to check her sugars at home.  She says her blood sugars checked by her PCP who sent her to the ED as it was elevated.  She denies any typical chest pain, nausea, vomiting, abdominal pain, dysuria.  ED Course:  Initial vitals showed BP 149/113, pulse 81, RR 18, temp 97.7 Fahrenheit, SPO2 98% on room air.  Labs are notable for initial CBG 464, BUN 33, potassium 3.3, bicarb 15, BUN 23, creatinine 1.16, serum glucose 46, anion gap >20.  Urinalysis showed >500 glucose, 80 ketones, negative nitrites, negative leukocytes, no bacteria on microscopy.  2 view chest x-ray showed chronic central airway thickening suspected secondary to bronchitis or reactive airway disease.  Patient was given 2 L normal saline started on IV insulin infusion.  Was also given potassium supplement.  The hospitalist service was called to admit for further evaluation and management of DKA.  Brief Admission  Hx: 15 y / o female presented with hyperglycemia, severe dehydration, URI and acute DKA with A1c>13%.    MDM/Assessment & Plan:   1. DKA - RESOLVED.  Uncontrolled type 2 Diabetes mellitus with A1c>13%.  Anion gap has closed and patient is feeling much better, We have transitioned to basal bolus insulin with supplemental sliding scale coverage.  She was started on diabetes education and insulin administration education which she has done well and feels comfortable given insulin.  She will be discharged on insulin pens.  I have changed her metformin to Glucophage XR 1000 mg BID with meals, plus Lantus solostar 45 units daily plus novolog flexpen 15 units TID with meals.  She was given a glucose meter kit prescription with supplies.  In addition I recommended that she call to establish care with endocrinologist Dr. Dorris Fetch.  I have referred to outpatient diabetes and nutrition center for further education.  Pt advised to test blood sugars 4 times per day and report to PCP.  Pt advised to call PCP for any blood sugars less than 80.   Hypoglycemia treatment and detection education information provided and patient verbalized understanding.  2. Hypokalemia - oral replacement ordered, follow BMP and magnesium with PCP on outpatient follow up.    3. AKI - improved with IV fluid hydration.   4. Acute bronchitis - likely viral and seems to be improving.  5. Essential hypertension - restarted home amlodipine and lisinopril HCT. 6. Tobacco - patient strongly advised to discontinue all tobacco use.    DVT prophylaxis: lovenox  Code Status: full  Family Communication: patient  Disposition  Plan: home tomorrow if stable    Consultants:  diabeted coordinators  Procedures:  n/a  Antimicrobials:  n/a  Discharge Diagnoses:  Principal Problem:   DM (diabetes mellitus) type 2, uncontrolled, with ketoacidosis ([place]) Active Problems:   Tobacco use   DKA (diabetic ketoacidoses) ([place])   Hypertension  associated with diabetes ([place])   AKI (acute kidney injury) ([place])   Hypokalemia   Bronchitis  Discharge Instructions: Discharge Instructions    Ambulatory referral to Nutrition and Diabetic Education   Complete by:  As directed    Call MD for:  difficulty breathing, headache or visual disturbances   Complete by:  As directed    Call MD for:  extreme fatigue   Complete by:  As directed    Call MD for:  persistant dizziness or light-headedness   Complete by:  As directed    Call MD for:  persistant nausea and vomiting   Complete by:  As directed    Increase activity slowly   Complete by:  As directed      Allergies as of 10/26/2018   No Known Allergies     Medication List    STOP taking these medications   atenolol-chlorthalidone 50-25 MG tablet Commonly known as:  TENORETIC   metFORMIN 500 MG tablet Commonly known as:  GLUCOPHAGE Replaced by:  metFORMIN 500 MG 24 hr tablet     TAKE these medications   amLODipine 10 MG tablet Commonly known as:  NORVASC Take 1 tablet (10 mg total) by mouth every morning.   blood glucose meter kit and supplies Dispense based on patient and insurance preference. Use up to four times daily as directed. (FOR ICD-10 E10.9, E11.9).   escitalopram 20 MG tablet Commonly known as:  LEXAPRO Take 20 mg by mouth daily.   insulin aspart 100 UNIT/ML FlexPen Commonly known as:  NOVOLOG FLEXPEN Inject 15 Units into the skin 3 (three) times daily with meals. DO NOT TAKE IF YOU DON'T EAT OR EAT A VERY LOW CARB MEAL.   Insulin Glargine 100 UNIT/ML Solostar Pen Commonly known as:  LANTUS Inject 45 Units into the skin daily. Start taking on:  October 27, 2018   Insulin Pen Needle 31G X 5 MM Misc 1 Device by Does not apply route as directed.   lisinopril-hydrochlorothiazide 10-12.5 MG tablet Commonly known as:  PRINZIDE,ZESTORETIC Take 1 tablet by mouth daily.   metFORMIN 500 MG 24 hr tablet Commonly known as:  GLUCOPHAGE-XR Take 2 tablets  (1,000 mg total) by mouth 2 (two) times daily with a meal for 30 days. Replaces:  metFORMIN 500 MG tablet   montelukast 10 MG tablet Commonly known as:  SINGULAIR Take 10 mg by mouth at bedtime.   potassium chloride 10 MEQ tablet Commonly known as:  K-DUR Take 1 tablet (10 mEq total) by mouth daily for 30 days.      Follow-up Information    Tollie Eth, NP Follow up in 3 day(s).   Specialty:  [facility] Why:  Hospital Follow Up  Contact information: [place] [address] [phone]        Cassandria Anger, MD. Schedule an appointment as soon as possible for a visit in 2 week(s).   Specialty:  Endocrinology Why:  Establish care for diabetes specialist Contact information: [place] [address] [phone]          No Known Allergies Allergies as of 10/26/2018   No Known Allergies  Medication List    STOP taking these medications   atenolol-chlorthalidone 50-25 MG tablet Commonly known as:  TENORETIC   metFORMIN 500 MG tablet Commonly known as:  GLUCOPHAGE Replaced by:  metFORMIN 500 MG 24 hr tablet     TAKE these medications   amLODipine 10 MG tablet Commonly known as:  NORVASC Take 1 tablet (10 mg total) by mouth every morning.   blood glucose meter kit and supplies Dispense based on patient and insurance preference. Use up to four times daily as directed. (FOR ICD-10 E10.9, E11.9).   escitalopram 20 MG tablet Commonly known as:  LEXAPRO Take 20 mg by mouth daily.   insulin aspart 100 UNIT/ML FlexPen Commonly known as:  NOVOLOG FLEXPEN Inject 15 Units into the skin 3 (three) times daily with meals. DO NOT TAKE IF YOU DON'T EAT OR EAT A VERY LOW CARB MEAL.   Insulin Glargine 100 UNIT/ML Solostar Pen Commonly known as:  LANTUS Inject 45 Units into the skin daily. Start taking on:  October 27, 2018   Insulin Pen Needle 31G X 5 MM Misc 1 Device by Does not apply route as directed.    lisinopril-hydrochlorothiazide 10-12.5 MG tablet Commonly known as:  PRINZIDE,ZESTORETIC Take 1 tablet by mouth daily.   metFORMIN 500 MG 24 hr tablet Commonly known as:  GLUCOPHAGE-XR Take 2 tablets (1,000 mg total) by mouth 2 (two) times daily with a meal for 30 days. Replaces:  metFORMIN 500 MG tablet   montelukast 10 MG tablet Commonly known as:  SINGULAIR Take 10 mg by mouth at bedtime.   potassium chloride 10 MEQ tablet Commonly known as:  K-DUR Take 1 tablet (10 mEq total) by mouth daily for 30 days.       Procedures/Studies: Dg Chest 2 View  Result Date: 10/24/2018 CLINICAL DATA:  Chest pain with wheezing, productive cough, congestion and shortness of breath for 3 days. Fever today. History of asthma. EXAM: CHEST - 2 VIEW COMPARISON:  Radiographs 09/24/2018.  CT and radiographs 01/06/2016. FINDINGS: The heart size and mediastinal contours are stable. There is mild chronic central airway thickening without focal airspace disease, pleural effusion or pneumothorax. The bones appear stable with mild degenerative changes in the spine. IMPRESSION: Chronic central airway thickening attributed to reactive airways disease or bronchitis. No evidence of pneumonia. Electronically Signed   By: Richardean Sale M.D.   On: 10/24/2018 13:26     Subjective: Pt without complaints.  Pt says that she feels comfortable administering insulin.    Discharge Exam: Vitals:   10/25/18 1420 10/26/18 0500  BP: 106/67 (!) 160/70  Pulse: 62 70  Resp: 18 18  Temp: 97.8 F (36.6 C) 98 F (36.7 C)  SpO2: 100% 100%   Vitals:   10/25/18 1100 10/25/18 1221 10/25/18 1420 10/26/18 0500  BP:  104/87 106/67 (!) 160/70  Pulse: 69 (!) 58 62 70  Resp:  '18 18 18  '$ Temp:  98.2 F (36.8 C) 97.8 F (36.6 C) 98 F (36.7 C)  TempSrc:  Oral Oral Oral  SpO2: 100% 100% 100% 100%  Weight:    92.7 kg  Height:       General: Pt is alert, awake, not in acute distress Cardiovascular: RRR, S1/S2 +, no rubs, no  gallops Respiratory: CTA bilaterally, no wheezing, no rhonchi Abdominal: Soft, NT, ND, bowel sounds + Extremities: no edema, no cyanosis   The results of significant diagnostics from this hospitalization (including imaging, microbiology, ancillary and laboratory) are listed below for reference.  Microbiology: Recent Results (from the past 240 hour(s))  MRSA PCR Screening     Status: None   Collection Time: 10/25/18  4:32 AM  Result Value Ref Range Status   MRSA by PCR NEGATIVE NEGATIVE Final    Comment:        The GeneXpert MRSA Assay (FDA approved for NASAL specimens only), is one component of a comprehensive MRSA colonization surveillance program. It is not intended to diagnose MRSA infection nor to guide or monitor treatment for MRSA infections. Performed at [facility], [address] [place], [place] [address]      Labs: BNP (last 3 results) No results for input(s): BNP in the last 8760 hours. Basic Metabolic Panel: Recent Labs  Lab 10/24/18 1300 10/24/18 1601 10/24/18 1938 10/25/18 0439 10/25/18 1738 10/26/18 0525  NA 133* 133* 136 140  --  138  K 3.3* 2.7* 2.8* 3.3*  --  2.8*  CL 92* 102 109 113*  --  102  CO2 15* 10* 13* 19*  --  23  GLUCOSE 483* 390* 243* 130* 410* 266*  BUN 23* '20 16 10  '$ --  7  CREATININE 1.16* 0.86 0.72 0.53  --  0.47  CALCIUM 10.3 8.4* 7.9* 8.0*  --  8.1*  MG 1.6*  --   --  2.3  --  1.9   Liver Function Tests: No results for input(s): AST, ALT, ALKPHOS, BILITOT, PROT, ALBUMIN in the last 168 hours. No results for input(s): LIPASE, AMYLASE in the last 168 hours. No results for input(s): AMMONIA in the last 168 hours. CBC: Recent Labs  Lab 10/24/18 1300 10/25/18 0439  WBC 6.0 4.2  HGB 17.7* 14.0  HCT 52.6* 40.7  MCV 83.0 82.7  PLT 254 107*   Cardiac Enzymes: Recent Labs  Lab 10/24/18 1300  TROPONINI <0.03   BNP: Invalid input(s): POCBNP CBG: Recent Labs  Lab 10/25/18 0730 10/25/18 1648 10/25/18 2132  10/26/18 0249 10/26/18 0746  GLUCAP 136* 405* 286* 202* 242*   D-Dimer No results for input(s): DDIMER in the last 72 hours. Hgb A1c Recent Labs    10/24/18 1601  HGBA1C 13.5*   Lipid Profile No results for input(s): CHOL, HDL, LDLCALC, TRIG, CHOLHDL, LDLDIRECT in the last 72 hours. Thyroid function studies No results for input(s): TSH, T4TOTAL, T3FREE, THYROIDAB in the last 72 hours.  Invalid input(s): FREET3 Anemia work up No results for input(s): VITAMINB12, FOLATE, FERRITIN, TIBC, IRON, RETICCTPCT in the last 72 hours. Urinalysis    Component Value Date/Time   COLORURINE STRAW (A) 10/24/2018 1410   APPEARANCEUR CLEAR 10/24/2018 1410   LABSPEC 1.016 10/24/2018 1410   PHURINE 5.0 10/24/2018 1410   GLUCOSEU >=500 (A) 10/24/2018 1410   HGBUR MODERATE (A) 10/24/2018 1410   BILIRUBINUR NEGATIVE 10/24/2018 1410   KETONESUR 80 (A) 10/24/2018 1410   PROTEINUR 100 (A) 10/24/2018 1410   UROBILINOGEN 0.2 05/12/2013 1039   NITRITE NEGATIVE 10/24/2018 1410   LEUKOCYTESUR NEGATIVE 10/24/2018 1410   Sepsis Labs Invalid input(s): PROCALCITONIN,  WBC,  LACTICIDVEN Microbiology Recent Results (from the past 240 hour(s))  MRSA PCR Screening     Status: None   Collection Time: 10/25/18  4:32 AM  Result Value Ref Range Status   MRSA by PCR NEGATIVE NEGATIVE Final    Comment:        The GeneXpert MRSA Assay (FDA approved for NASAL specimens only), is one component of a comprehensive MRSA colonization surveillance program. It is not intended to diagnose MRSA infection nor to  guide or monitor treatment for MRSA infections. Performed at [facility], [address] [place], [place] [address]     Time coordinating discharge:   SIGNED:  Irwin Brakeman, MD  [facility] 10/26/2018, 10:59 AM How to contact the [facility] Attending or Consulting provider [place] or covering provider during after hours [place], for this patient?  1. Check the care team in [facility] and look for a)  attending/consulting [facility] provider listed and b) the [facility] team listed 2. Log into [email or web address] and use [place]'s universal password to access. If you do not have the password, please contact the hospital operator. 3. Locate the [facility] provider you are looking for under [facility] and page to a number that you can be directly reached. 4. If you still have difficulty reaching the provider, please page the [facility] (Director on Call) for the Hospitalists listed on amion for assistance.

## 2018-10-27 LAB — GLUCOSE, CAPILLARY: Glucose-Capillary: 304 mg/dL — ABNORMAL HIGH (ref 70–99)

## 2018-11-10 ENCOUNTER — Encounter: Payer: BLUE CROSS/BLUE SHIELD | Attending: Family Medicine | Admitting: Nutrition

## 2018-11-10 ENCOUNTER — Encounter: Payer: Self-pay | Admitting: Nutrition

## 2018-11-10 VITALS — Ht 63.0 in | Wt 220.0 lb

## 2018-11-10 DIAGNOSIS — IMO0002 Reserved for concepts with insufficient information to code with codable children: Secondary | ICD-10-CM

## 2018-11-10 DIAGNOSIS — E1165 Type 2 diabetes mellitus with hyperglycemia: Secondary | ICD-10-CM | POA: Diagnosis not present

## 2018-11-10 DIAGNOSIS — E118 Type 2 diabetes mellitus with unspecified complications: Secondary | ICD-10-CM | POA: Insufficient documentation

## 2018-11-10 DIAGNOSIS — I10 Essential (primary) hypertension: Secondary | ICD-10-CM | POA: Diagnosis not present

## 2018-11-10 DIAGNOSIS — E669 Obesity, unspecified: Secondary | ICD-10-CM | POA: Insufficient documentation

## 2018-11-10 NOTE — Progress Notes (Signed)
Medical Nutrition Therapy:  Appt start time: 0800 end time:  0900.   Assessment:  Primary concerns today: Diabetes Type 2. Lives with  her husband. She and her husband shop and cook together.  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  Found out she has DM Type 2 a few weeks.Sees a NP at the [facility]. Was in the hospital recently for DKA.  Had some chest pain when she found out she had DM.  Eats 2 meals per day- Breakfast and supper. Sleeping during lunch ours due to work schedule. . . Eats lunch and supper. Lantus 45 units a day and 15 humalog with meals. FBS 120-160's. BS 120-130's.  Before meals 140's. She just started recently testing blood sugars.  Has recently signed up for [employer]. Engaged to make changes with her diet and exercise.  Is not on a lipid lowering medication.  She would benefit from a referral to an Endocrinologist.   Lab Results  Component Value Date   HGBA1C 13.5 (H) 10/24/2018    Vitals with BMI 11/10/2018 10/26/2018  Height 5\' 3"    Weight 220 lbs 204 lbs 6 oz  BMI 99.24 26.83  Systolic  419  Diastolic  70  Pulse  70  Respirations  18   CMP Latest Ref Rng & Units 10/26/2018 10/25/2018 10/25/2018  Glucose 70 - 99 mg/dL 266(H) 410(H) 130(H)  BUN 6 - 20 mg/dL 7 - 10  Creatinine 0.44 - 1.00 mg/dL 0.47 - 0.53  Sodium 135 - 145 mmol/L 138 - 140  Potassium 3.5 - 5.1 mmol/L 2.8(L) - 3.3(L)  Chloride 98 - 111 mmol/L 102 - 113(H)  CO2 22 - 32 mmol/L 23 - 19(L)  Calcium 8.9 - 10.3 mg/dL 8.1(L) - 8.0(L)  Total Protein 6.5 - 8.1 g/dL - - -  Total Bilirubin 0.3 - 1.2 mg/dL - - -  Alkaline Phos 38 - 126 U/L - - -  AST 15 - 41 U/L - - -  ALT 14 - 54 U/L - - -    Preferred Learning Style:  Auditory  Visual  Hands on  Learning Readiness:   MEDICATIONS:    DIETARY INTAKE:  24-hr recall:  B ( AM) 8 am: Bagel  Bacon, egg and cheese,-sheets, water Snk ( AM):  L ( PM):water, grape  juice Snk ( PM): D ( PM): BBQ sandwich with 2 slice bread, M'M's packet,  Water Snk ( PM): water  Beverages: water  Usual physical activity: ADL  Estimated energy needs: 1500 calories 170 g carbohydrates 112 g protein 42 g fat  Progress Towards Goal(s):  In progress.   Nutritional Diagnosis:  NB-1.1 Food and nutrition-related knowledge deficit As related to Diabetes Type 2.  As evidenced by A1C 13.2%.    Intervention:  Nutrition and Diabetes education provided on My Plate, CHO counting, meal planning, portion sizes, timing of meals, avoiding snacks between meals unless having a low blood sugar, target ranges for A1C and blood sugars, signs/symptoms and treatment of hyper/hypoglycemia, monitoring blood sugars, taking medications as prescribed, benefits of exercising 30 minutes per day and prevention of complications of DM.  Goals Follow MY Plate Cut out juices Drink more water  Eat meals on time Take insulin as prescribed Test blood sugars 4 times per day Exercise 3 times a week for 30 minutes Get A1C down to 7%  Teaching Method Utilized:  Visual Auditory Hands on  Handouts given  during visit include:  The Plate Method   Meal Plan Card  Diabetes Instrucitons.   Barriers to learning/adherence to lifestyle change: none  Demonstrated degree of understanding via:  Teach Back   Monitoring/Evaluation:  Dietary intake, exercise, meal planning, and body weight in 1 month(s). Would benefit from referral to Endocrinologist in [place] since that is the closest for her.

## 2018-11-10 NOTE — Patient Instructions (Addendum)
Goals Follow MY Plate Cut out juices Drink more water  Eat meals on time Take insulin as prescribed Test blood sugars 4 times per day Exercise 3 times a week for 30 minutes Get A1C down to 7%

## 2018-12-09 ENCOUNTER — Telehealth: Payer: Self-pay | Admitting: Nutrition

## 2018-12-09 ENCOUNTER — Ambulatory Visit: Payer: BLUE CROSS/BLUE SHIELD | Admitting: Nutrition

## 2018-12-09 NOTE — Telephone Encounter (Signed)
VM left to call to do phone follow up visit.

## 2019-04-18 IMAGING — DX DG CHEST 2V
2 series · 2 of 2 positions shown · non-contrast
Comparison: 01/06/2016 CT angiogram chest. 01/07/2015 chest
radiograph.

CLINICAL DATA: 47 y/o F; 3 days of dry cough and shortness of
breath.

EXAM:
CHEST - 2 VIEW

[chest pa]
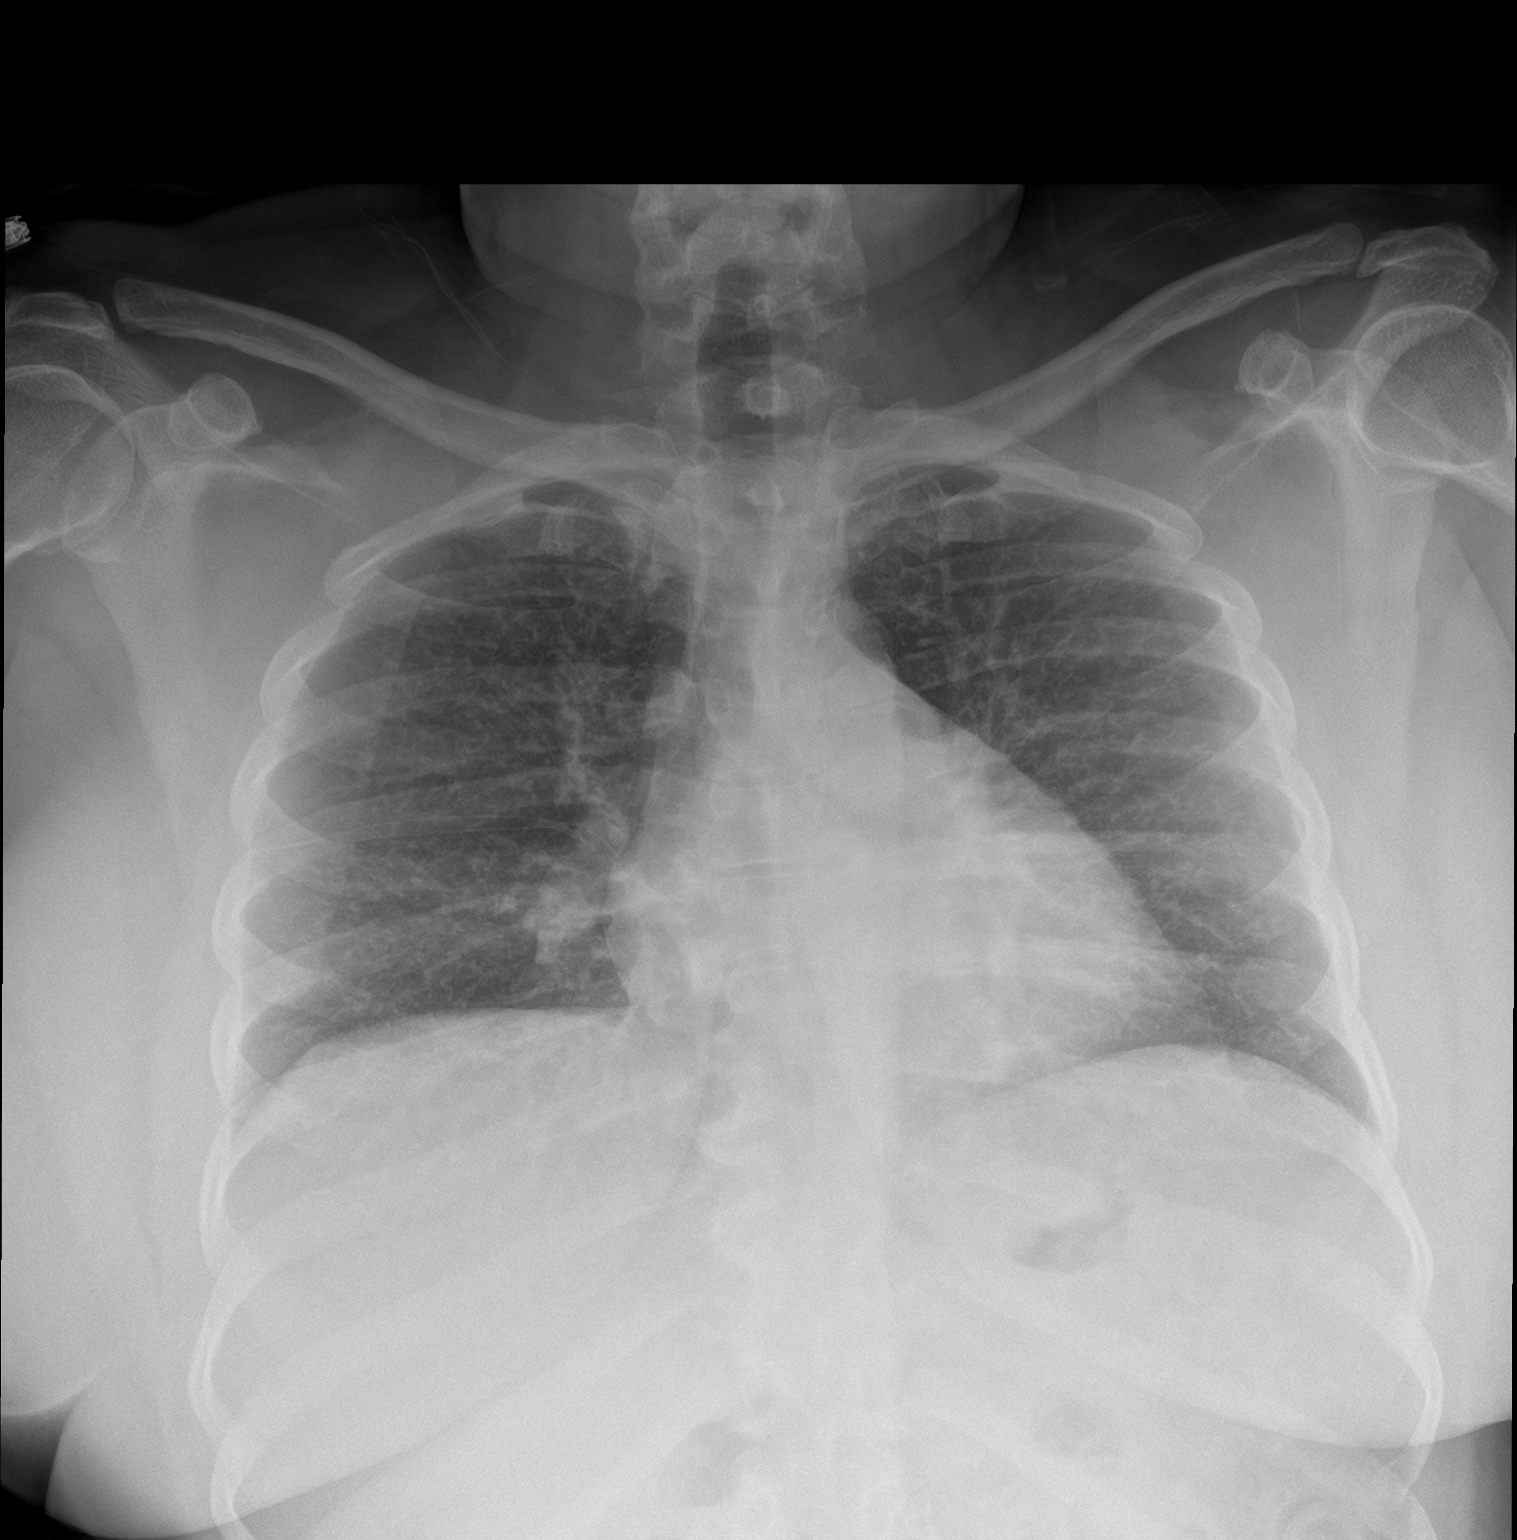

[chest lat]
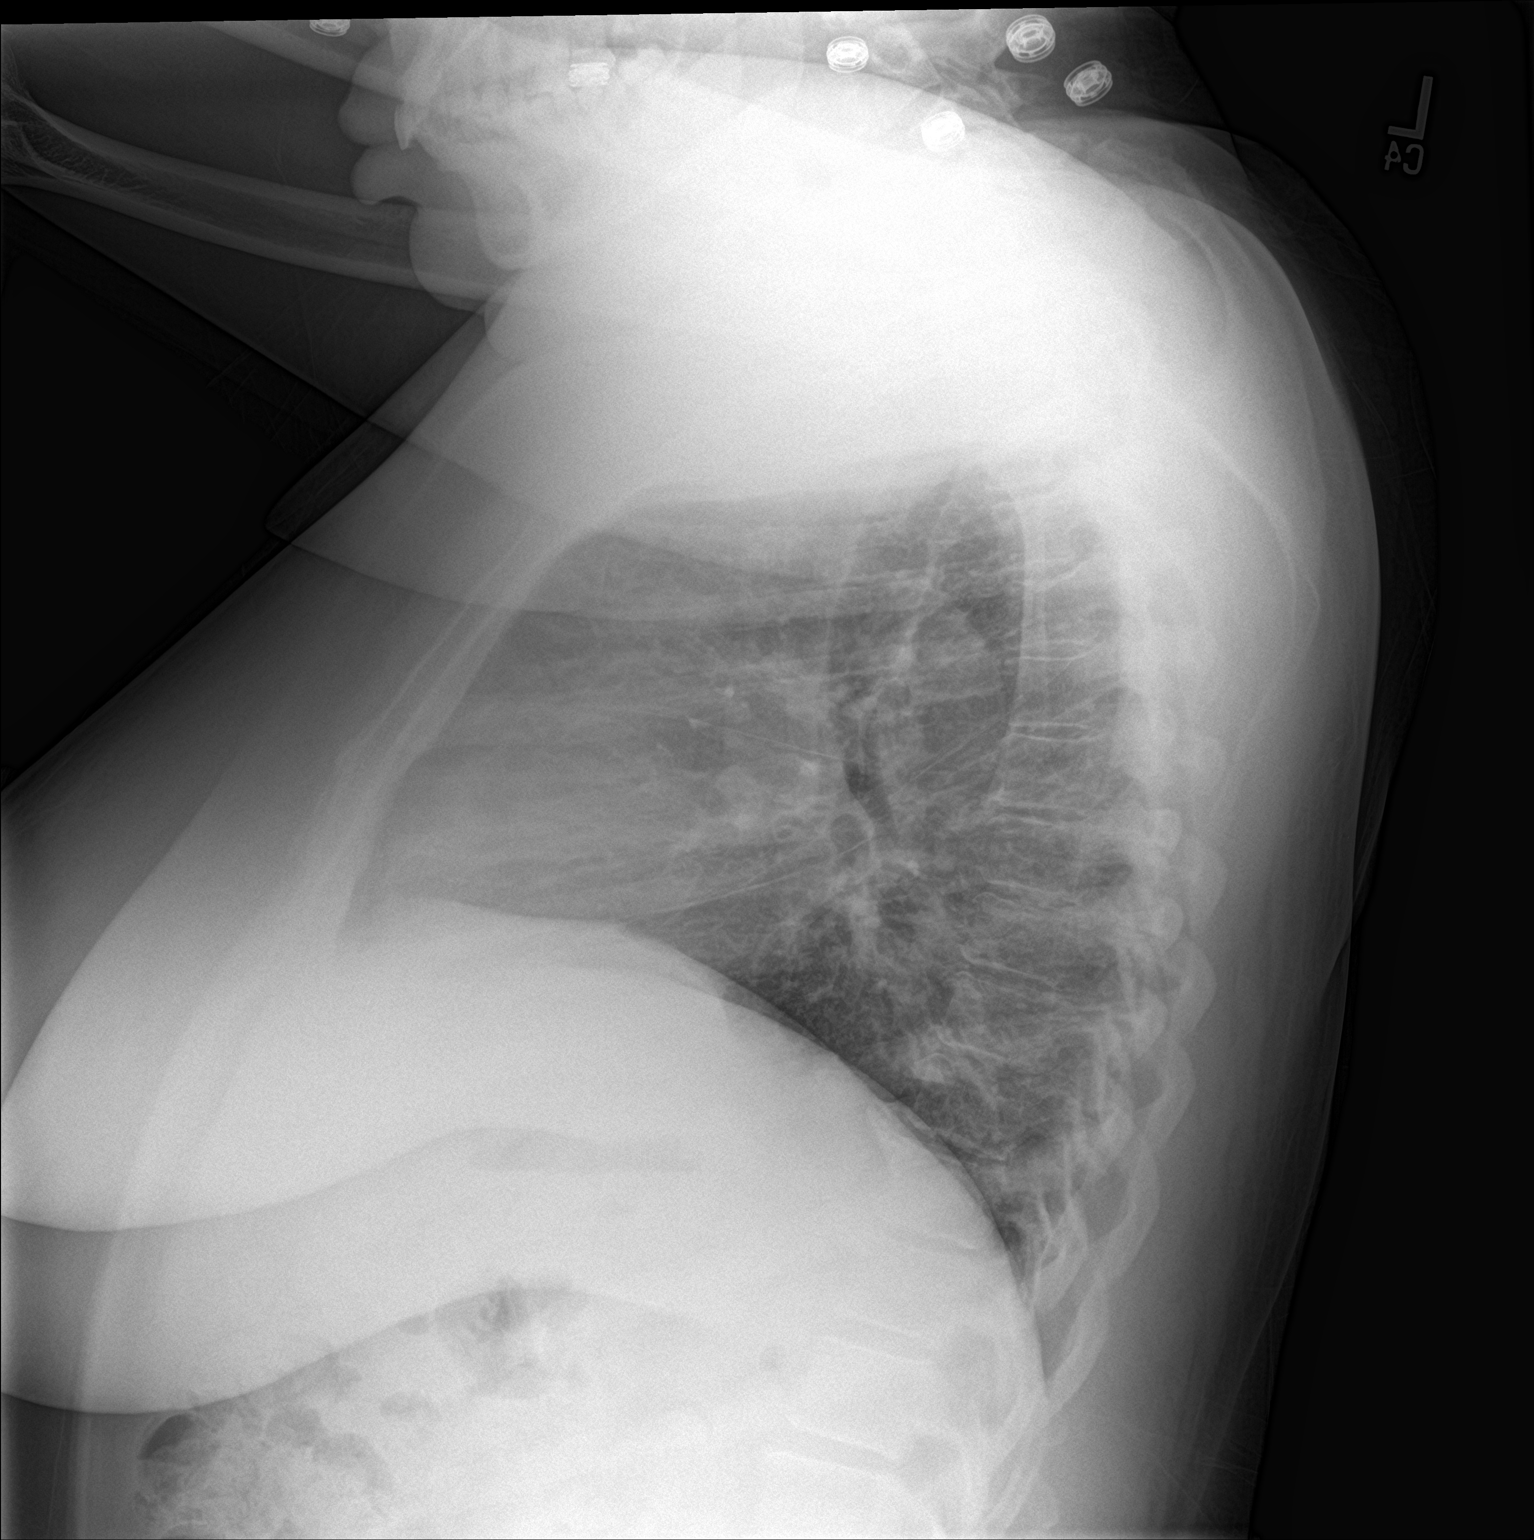

[2 of 2 positions shown; findings below may reference images not displayed]

FINDINGS: Stable borderline enlarged cardiac silhouette given projection and
technique. Mild basilar predominant reticular opacities. No
consolidation, effusion, or pneumothorax. No acute osseous
abnormality. Spondylosis of thoracic spine.
IMPRESSION: Mild basilar predominant reticular opacities, probably bronchitic
changes or interstitial edema. No consolidation. Stable borderline
cardiomegaly.

## 2019-05-18 IMAGING — DX DG CHEST 2V
2 series · 2 of 2 positions shown · non-contrast
Comparison: Radiographs 09/24/2018.  CT and radiographs 01/06/2016.

CLINICAL DATA: Chest pain with wheezing, productive cough,
congestion and shortness of breath for 3 days. Fever today. History
of asthma.

EXAM:
CHEST - 2 VIEW

[chest pa]
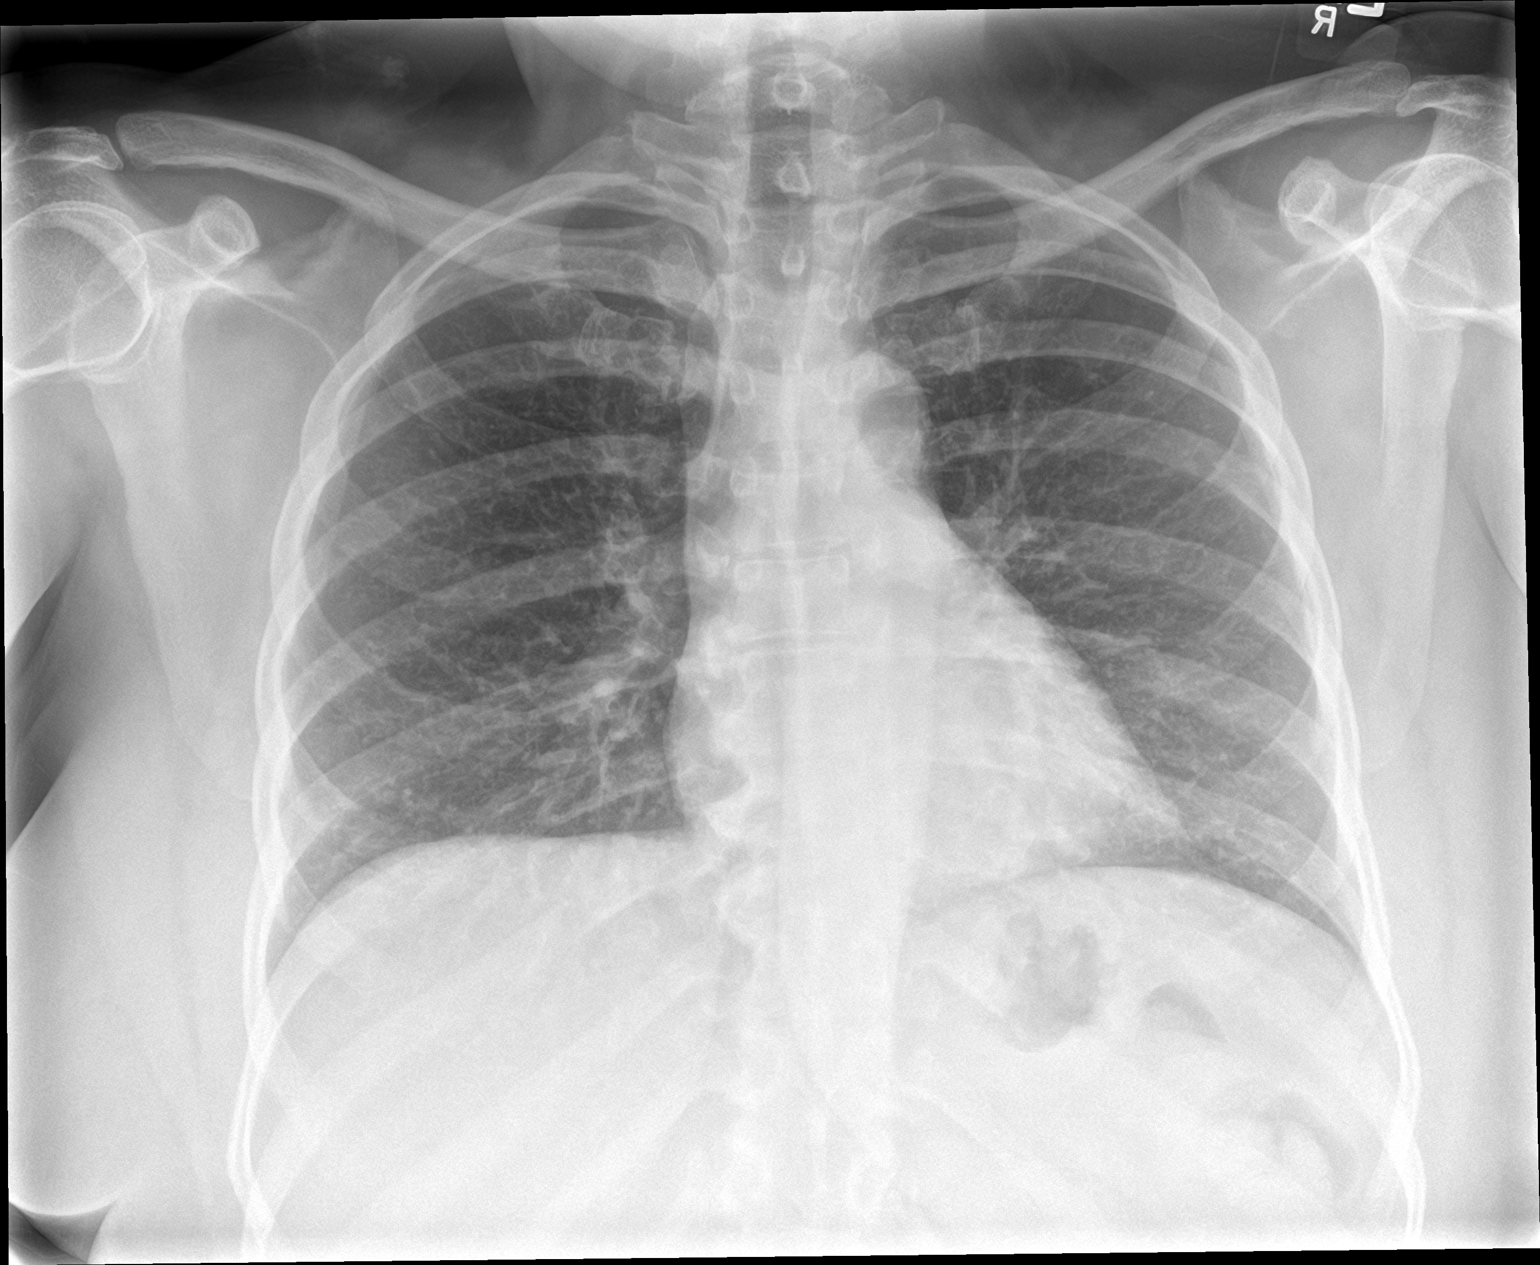

[chest lat]
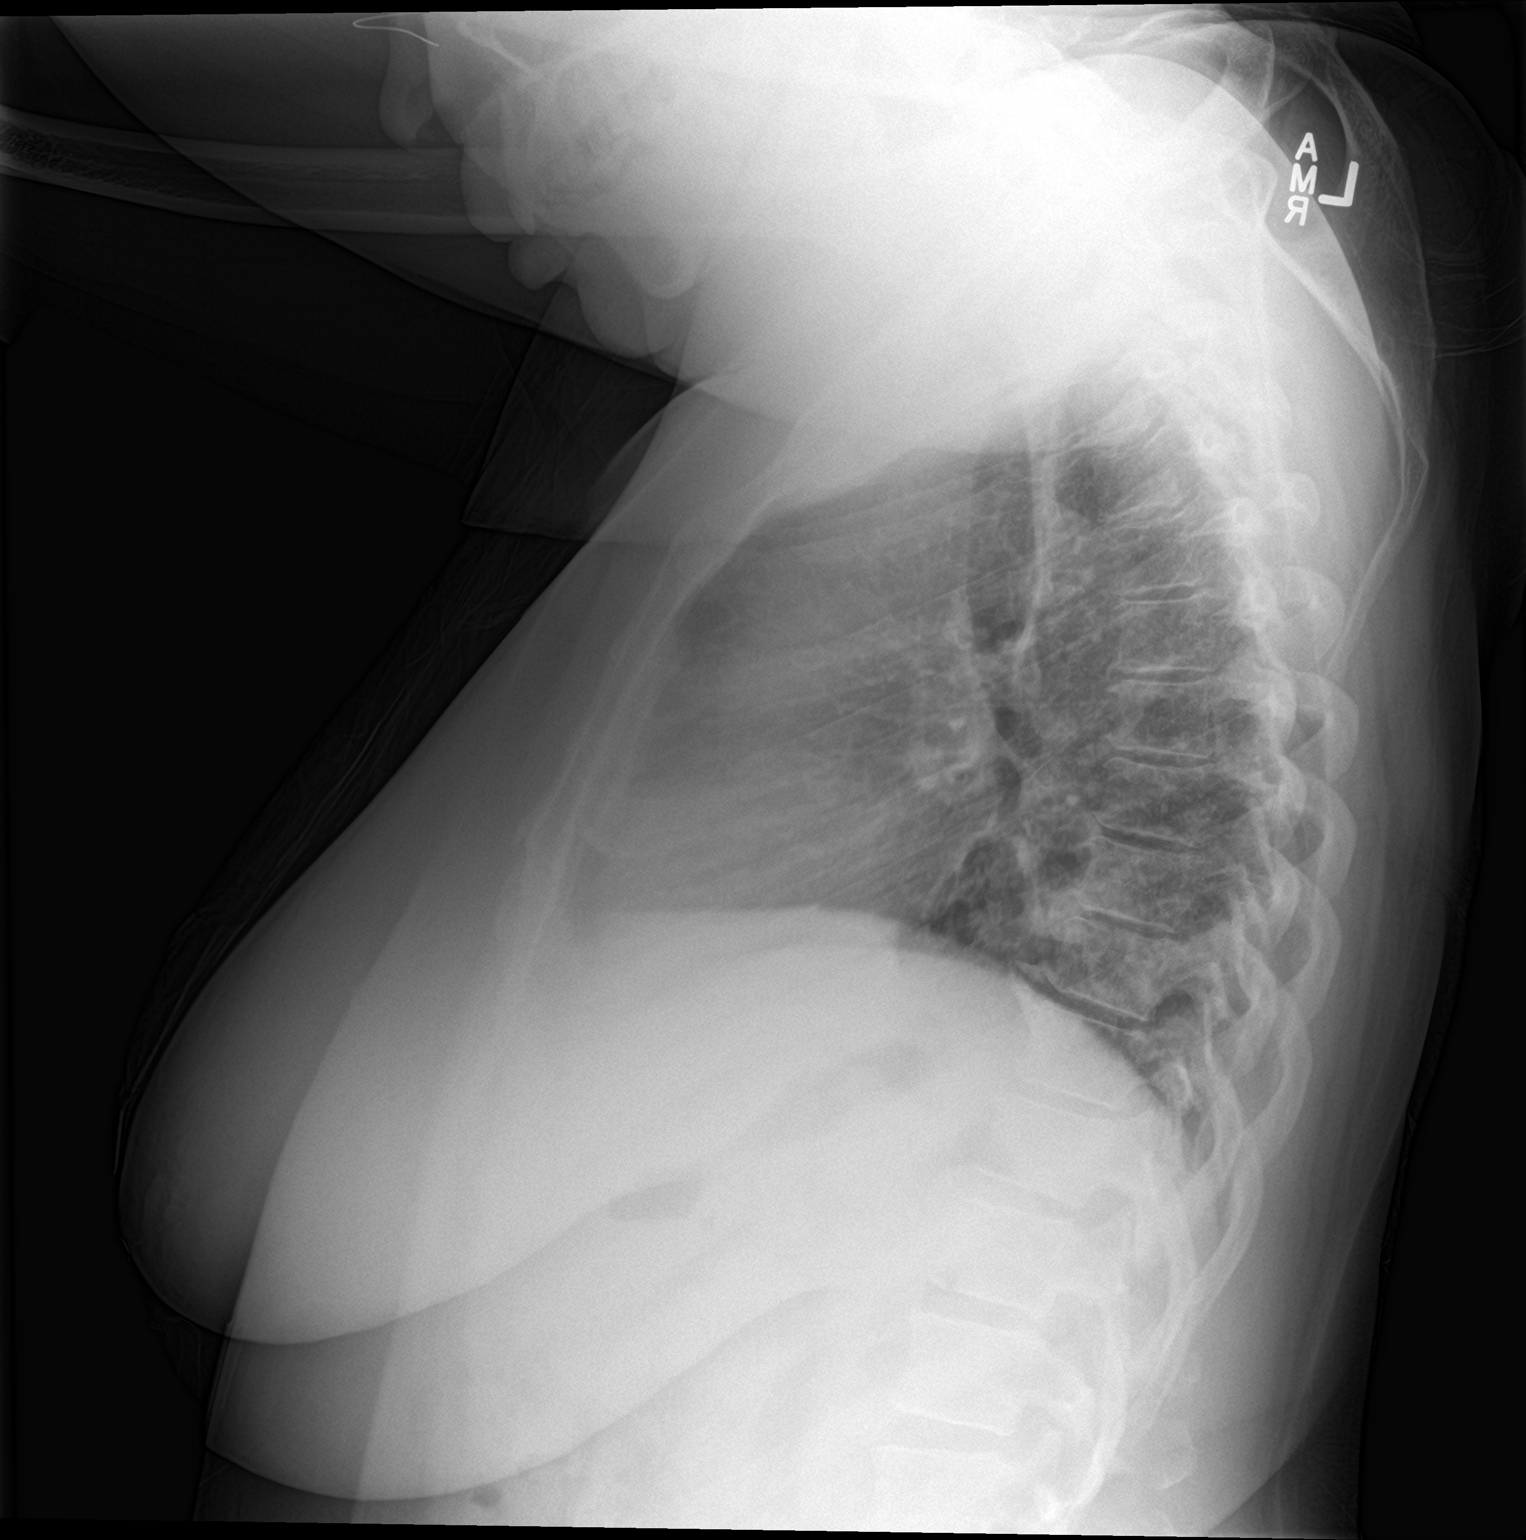

[2 of 2 positions shown; findings below may reference images not displayed]

FINDINGS: The heart size and mediastinal contours are stable. There is mild
chronic central airway thickening without focal airspace disease,
pleural effusion or pneumothorax. The bones appear stable with mild
degenerative changes in the spine.
IMPRESSION: Chronic central airway thickening attributed to reactive airways
disease or bronchitis. No evidence of pneumonia.

## 2019-07-20 ENCOUNTER — Encounter: Payer: Self-pay | Admitting: Orthopedic Surgery

## 2019-07-20 ENCOUNTER — Ambulatory Visit (INDEPENDENT_AMBULATORY_CARE_PROVIDER_SITE_OTHER): Payer: BC Managed Care – PPO | Admitting: Orthopedic Surgery

## 2019-07-20 ENCOUNTER — Other Ambulatory Visit: Payer: Self-pay

## 2019-07-20 ENCOUNTER — Ambulatory Visit: Payer: BC Managed Care – PPO

## 2019-07-20 VITALS — BP 159/87 | HR 70 | Ht 63.0 in | Wt 214.0 lb

## 2019-07-20 DIAGNOSIS — M25532 Pain in left wrist: Secondary | ICD-10-CM | POA: Diagnosis not present

## 2019-07-20 DIAGNOSIS — M778 Other enthesopathies, not elsewhere classified: Secondary | ICD-10-CM | POA: Diagnosis not present

## 2019-07-20 DIAGNOSIS — Z720 Tobacco use: Secondary | ICD-10-CM | POA: Diagnosis not present

## 2019-07-20 MED ORDER — MELOXICAM 7.5 MG PO TABS: 7.5000 mg | ORAL_TABLET | Freq: Every day | ORAL | 5 refills | Status: DC

## 2019-07-20 NOTE — Progress Notes (Signed)
Chief Complaint  Patient presents with  . Hand Pain    left/ patient states she thinks she has carpal tunnel syndrome / pain x 2 months   48 year old female presents with 13-month history of pain in her left wrist which began while she was at work lifting heavy boxes.  She complains of left wrist pain on the medial lateral side and volar aspect with no evidence of numbness or tingling or pain distal to the wrist.  She has taken some medication she is worn a brace.  It was ibuprofen.  It did not help.  She is rested for 2 months with no work and has not improved  Review of systems no positive review of system findings  Past Medical History:  Diagnosis Date  . Anemia   . Diabetes mellitus without complication ([place])   . Fibroids   . Hypertension   . Sickle cell trait [facility])     Past Surgical History:  Procedure Laterality Date  . ABDOMINAL HYSTERECTOMY N/A 10/04/2015   Procedure: HYSTERECTOMY ABDOMINAL;  Surgeon: Jonnie Kind, MD;  Location: [facility];  Service: Gynecology;  Laterality: N/A;  . BILATERAL SALPINGECTOMY Bilateral 10/04/2015   Procedure: BILATERAL SALPINGECTOMY;  Surgeon: Jonnie Kind, MD;  Location: [facility];  Service: Gynecology;  Laterality: Bilateral;  . TUBAL LIGATION     Family History  Problem Relation Age of Onset  . Diabetes Father   . Asthma Brother   . Hypertension Mother   . Allergic rhinitis Neg Hx   . Eczema Neg Hx   . Immunodeficiency Neg Hx   . Urticaria Neg Hx    Social History   Tobacco Use  . Smoking status: Current Every Day Smoker    Packs/day: 0.50    Years: 28.00    Pack years: 14.00    Types: Cigarettes  . Smokeless tobacco: Never Used  Substance Use Topics  . Alcohol use: Yes    Comment: occ.  . Drug use: No    BP (!) 159/87   Pulse 70   Ht 5\' 3"  (1.6 m)   Wt 214 lb (97.1 kg)   LMP 06/23/2015   BMI 37.91 kg/m   Physical Exam Her appearance is normal.  She has excellent grooming and hygiene. She is oriented x3 Her mood is  pleasant her affect is normal Her gait is normal with heel-to-toe pattern  The right wrist is nontender and has full range of motion it is stable there is no atrophy or tremor the skin is normal the pulses are good and sensation is intact  The left wrist is tender on the volar aspect right at the wrist joint tender over the first extensor compartment tender over the TFCC and also some tenderness on the volar side of the forearm radiating up towards the elbow range of motion is normal there is no swelling the wrist is stable the muscle tone is excellent there is no tremor skin looks clear pulses are normal and sensation is intact  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  . Pain in left wrist   . Tobacco consumption   . Tendonitis of wrist, left Yes    Plan is to be out of work for 6 weeks Brace 16 hours a day Mobic once a day  Follow-up in 6 weeks

## 2019-07-20 NOTE — Patient Instructions (Addendum)
Wear brace left wrist 16 hrs per day   Take 1 mobic daily   Do not lift anything heavy    Tendinitis  Tendinitis is swelling (inflammation) of a tendon. A tendon is a cord of tissue that connects muscle to bone. Tendinitis can cause pain, tenderness, and swelling. What are the causes?  Using a tendon or muscle too much (overuse). This is a common cause.  Wear and tear that happens as you age.  Injury.  Some medical conditions, such as arthritis.  Some medicines. What increases the risk? You are more likely to get this condition if you do activities that involve the same movements over and over again (repetitive motions). What are the signs or symptoms?  Pain.  Tenderness.  Mild swelling.  Decreased range of motion. How is this treated? This condition is usually treated with RICE therapy. RICE stands for:  Rest.  Ice.  Compression. This means putting pressure on the affected area.  Elevation. This means raising the affected area above the level of your heart. Treatment may also include:  Medicines for swelling or pain.  Exercises or physical therapy.  A brace or splint.  Surgery. This is rarely needed. Follow these instructions at home: If you have a splint or brace:  Wear the splint or brace as told by your doctor. Remove it only as told by your doctor.  Loosen the splint or brace if your fingers or toes: ? Tingle. ? Become numb. ? Turn cold and blue.  Keep the splint or brace clean.  If the splint or brace is not waterproof: ? Do not let it get wet. ? Cover it with a watertight covering when you take a bath or shower. Managing pain, stiffness, and swelling      If told, put ice on the affected area. ? If you have a removable splint or brace, remove it as told by your doctor. ? Put ice in a plastic bag. ? Place a towel between your skin and the bag. ? Leave the ice on for 20 minutes, 2-3 times a day.  Move the fingers or toes of the  affected arm or leg often, if this applies. This helps to prevent stiffness and to lessen swelling.  If told, raise the affected area above the level of your heart while you are sitting or lying down.  If told, put heat on the affected area before you exercise. Use the heat source that your doctor recommends, such as a moist heat pack or a heating pad. ? Place a towel between your skin and the heat source. ? Leave the heat on for 20-30 minutes. ? Remove the heat if your skin turns bright red. This is very important if you are unable to feel pain, heat, or cold. You may have a greater risk of getting burned. Driving  Do not drive or use heavy machinery while taking prescription pain medicine.  Ask your doctor when it is safe to drive if you have a splint or brace on any part of your arm or leg. Activity  Rest the affected area as told by your doctor.  Return to your normal activities as told by your doctor. Ask your doctor what activities are safe for you.  Avoid using the affected area while you have symptoms.  Do exercises as told by your doctor. General instructions  If you have a splint, do not put pressure on any part of the splint until it is fully hardened. This may take several  hours.  Wear an elastic bandage or pressure (compression) wrap only as told by your doctor.  Take over-the-counter and prescription medicines only as told by your doctor.  Keep all follow-up visits as told by your doctor. This is important. Contact a doctor if:  You do not get better.  You get new problems, such as numbness in your hands, and you do not know why. Summary  Tendinitis is swelling (inflammation) of a tendon.  You are more likely to get this condition if you do activities that involve the same movements over and over again.  This condition is usually treated with RICE therapy. RICE stands for rest, ice, compression, and elevate.  Avoid using the affected area while you have  symptoms. This information is not intended to replace advice given to you by your health care provider. Make sure you discuss any questions you have with your health care provider. Document Released: 12/14/2010 Document Revised: 03/11/2018 Document Reviewed: 01/22/2018 Elsevier Patient Education  2020 [place] NOTE NEEDED OOW 6 WEEKS

## 2019-08-31 ENCOUNTER — Ambulatory Visit: Payer: BC Managed Care – PPO | Admitting: Orthopedic Surgery

## 2019-08-31 ENCOUNTER — Encounter: Payer: Self-pay | Admitting: Orthopedic Surgery

## 2019-08-31 ENCOUNTER — Other Ambulatory Visit: Payer: Self-pay

## 2019-08-31 VITALS — BP 132/73 | HR 68 | Ht 63.0 in | Wt 215.0 lb

## 2019-08-31 DIAGNOSIS — G5603 Carpal tunnel syndrome, bilateral upper limbs: Secondary | ICD-10-CM | POA: Diagnosis not present

## 2019-08-31 DIAGNOSIS — R202 Paresthesia of skin: Secondary | ICD-10-CM

## 2019-08-31 NOTE — Progress Notes (Signed)
Chief Complaint  Patient presents with  . Wrist Pain    Recheck on left wrist. no improvement     [name] is a 48 year old female who we saw in November for pain in her left upper extremity and forearm which began while she was at work lifting heavy boxes.  She had had pain for 2 months when we saw her no evidence of numbness or tingling she took some over-the-counter medication wore a brace and did not help we took her out of work continued bracing added Mobic once a day she comes in with similar symptoms and started to have some discomfort in the right wrist although she continues to deny any numbness or tingling  Review of systems no fever no chills no skin changes no numbness no tingling no sensory changes  BP 132/73   Pulse 68   Ht 5\' 3"  (1.6 m)   Wt 215 lb (97.5 kg)   LMP 06/23/2015   BMI 38.09 kg/m   When we examine her left upper extremity all of her pain is in the forearm starting at the wrist crease but proximally no distal symptoms no numbness no tingling no weakness de Quervain's negative and then we find similar findings on the right upper extremity  Recommend she stay out of work for 6 weeks stop the Mobic  EMG nerve conduction study  Gabapentin 100 mg 3 times a day and then follow-up by phone call after testing is completed

## 2019-08-31 NOTE — Patient Instructions (Signed)
OOW NOTE 6 WEEKS   NCS, EMG   STOP MOBIC START NEURONTIN 100 MG 3 X A DAY

## 2019-09-03 ENCOUNTER — Telehealth: Payer: Self-pay | Admitting: Radiology

## 2019-09-03 NOTE — Telephone Encounter (Signed)
Patient lmom that she was referred to our office by Dr. Aline Brochure for a NCS.  She is calling to sched appt?

## 2019-09-04 NOTE — Telephone Encounter (Signed)
Called pt and lvm stating that we have not received Dr. Aline Brochure referral and will call her to schedule once Dr. Ernestina Patches advises.

## 2019-09-14 ENCOUNTER — Other Ambulatory Visit: Payer: Self-pay | Admitting: Orthopedic Surgery

## 2019-09-14 ENCOUNTER — Telehealth: Payer: Self-pay | Admitting: Orthopedic Surgery

## 2019-09-14 DIAGNOSIS — M778 Other enthesopathies, not elsewhere classified: Secondary | ICD-10-CM

## 2019-09-14 DIAGNOSIS — M25532 Pain in left wrist: Secondary | ICD-10-CM

## 2019-09-14 MED ORDER — MELOXICAM 7.5 MG PO TABS: 7.5000 mg | ORAL_TABLET | Freq: Every day | ORAL | 5 refills | Status: DC

## 2019-09-14 NOTE — Addendum Note (Signed)
Addended byCandice Camp on: 09/14/2019 10:03 AM   Modules accepted: Orders

## 2019-09-14 NOTE — Telephone Encounter (Signed)
Patient called to relay that she has not heard from the referral office she was to be sent, nor did she have any prescription ready at [facility], [place], per office visit 08/31/19, which indicates: "Instructions   Return for CALL RESULTS TO PATIENT. OOW NOTE 6 WEEKS   NCS, EMG   STOP MOBIC START NEURONTIN 100 MG 3 X A DAY      Please advise patient - said she needs to go back to work.

## 2019-09-14 NOTE — Progress Notes (Signed)
M

## 2019-09-17 ENCOUNTER — Telehealth: Payer: Self-pay | Admitting: Physical Medicine and Rehabilitation

## 2019-09-17 NOTE — Telephone Encounter (Signed)
Scheduled

## 2019-10-08 ENCOUNTER — Ambulatory Visit (INDEPENDENT_AMBULATORY_CARE_PROVIDER_SITE_OTHER): Payer: BC Managed Care – PPO | Admitting: Physical Medicine and Rehabilitation

## 2019-10-08 ENCOUNTER — Encounter: Payer: Self-pay | Admitting: Physical Medicine and Rehabilitation

## 2019-10-08 ENCOUNTER — Other Ambulatory Visit: Payer: Self-pay

## 2019-10-08 DIAGNOSIS — R202 Paresthesia of skin: Secondary | ICD-10-CM

## 2019-10-08 NOTE — Progress Notes (Signed)
 .  Numeric Pain Rating Scale and Functional Assessment Average Pain 8   In the last MONTH (on 0-10 scale) has pain interfered with the following?  1. General activity like being  able to carry out your everyday physical activities such as walking, climbing stairs, carrying groceries, or moving a chair?  Rating(8)   

## 2019-10-09 NOTE — Procedures (Signed)
EMG & NCV Findings: Evaluation of the left median motor and the right median motor nerves showed decreased conduction velocity (Elbow-Wrist, L48, R49 m/s).  The left median (across palm) sensory and the right median (across palm) sensory nerves showed prolonged distal peak latency (Wrist, L4.2, R4.4 ms).  All remaining nerves (as indicated in the following tables) were within normal limits.  Left vs. Right side comparison data for the ulnar motor nerve indicates abnormal L-R amplitude difference (26.9 %).  All remaining left vs. right side differences were within normal limits.    All examined muscles (as indicated in the following table) showed no evidence of electrical instability.    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    There is no significant electrodiagnostic evidence of any other focal nerve entrapment brachial plexopathy, cervical radiculopathy or generalized peripheral neuropathy.    **This electrodiagnostic study cannot rule out small fiber polyneuropathy and dysesthesias from central pain syndromes such as stroke or central pain sensitization syndromes such as fibromyalgia.  Myotomal referral pain from trigger points is also not excluded.   Recommendations: 1.  Follow-up with referring physician. 2.  Continue current management of symptoms. 3.  Continue use of resting splint at night-time and as needed during the day. 4.  Suggest surgical evaluation.  ___________________________ Laurence Spates FAAPMR Board Certified, American Board of Physical Medicine and Rehabilitation    Nerve Conduction Studies Anti Sensory Summary Table   Stim Site NR Peak (ms) Norm Peak (ms) P-T Amp (V) Norm P-T Amp Site1 Site2 Delta-P (ms) Dist (cm) Vel (m/s) Norm Vel (m/s)  Left Median Acr Palm Anti Sensory (2nd Digit)  32.1C  Wrist     *4.2 <3.6 41.6 >10 Wrist Palm 2.4 0.0    Palm    1.8 <2.0 6.7         Right Median Acr Palm Anti Sensory (2nd Digit)  31.8C  Wrist    *4.4 <3.6 29.4 >10 Wrist Palm 2.6 0.0    Palm    1.8 <2.0 16.4         Left Radial Anti Sensory (Base 1st Digit)  31.7C  Wrist    2.0 <3.1 49.2  Wrist Base 1st Digit 2.0 0.0    Right Radial Anti Sensory (Base 1st Digit)  32C  Wrist    2.1 <3.1 25.6  Wrist Base 1st Digit 2.1 0.0    Left Ulnar Anti Sensory (5th Digit)  32.1C  Wrist    3.3 <3.7 25.7 >15.0 Wrist 5th Digit 3.3 14.0 42 >38  Right Ulnar Anti Sensory (5th Digit)  32.1C  Wrist    3.2 <3.7 21.1 >15.0 Wrist 5th Digit 3.2 14.0 44 >38   Motor Summary Table   Stim Site NR Onset (ms) Norm Onset (ms) O-P Amp (mV) Norm O-P Amp Site1 Site2 Delta-0 (ms) Dist (cm) Vel (m/s) Norm Vel (m/s)  Left Median Motor (Abd Poll Brev)  31.8C  Wrist    4.1 <4.2 7.2 >5 Elbow Wrist 4.2 20.0 *48 >50  Elbow    8.3  7.1         Right Median Motor (Abd Poll Brev)  32.2C  Wrist    4.1 <4.2 9.2 >5 Elbow Wrist 4.1 20.0 *49 >50  Elbow    8.2  9.3         Left Ulnar Motor (Abd Dig Min)  32C  Wrist  3.0 <4.2 9.5 >3 B Elbow Wrist 3.8 20.5 54 >53  B Elbow    6.8  8.9  A Elbow B Elbow 1.1 10.0 91 >53  A Elbow    7.9  8.8         Right Ulnar Motor (Abd Dig Min)  32.4C  Wrist    2.7 <4.2 13.0 >3 B Elbow Wrist 3.7 20.5 55 >53  B Elbow    6.4  12.3  A Elbow B Elbow 1.4 11.0 79 >53  A Elbow    7.8  11.7          EMG   Side Muscle Nerve Root Ins Act Fibs Psw Amp Dur Poly Recrt Int Fraser Din Comment  Right Abd Poll Brev Median C8-T1 Nml Nml Nml Nml Nml 0 Nml Nml   Right 1stDorInt Ulnar C8-T1 Nml Nml Nml Nml Nml 0 Nml Nml   Right PronatorTeres Median C6-7 Nml Nml Nml Nml Nml 0 Nml Nml   Right Biceps Musculocut C5-6 Nml Nml Nml Nml Nml 0 Nml Nml   Right Deltoid Axillary C5-6 Nml Nml Nml Nml Nml 0 Nml Nml     Nerve Conduction Studies Anti Sensory Left/Right Comparison   Stim Site L Lat (ms) R Lat (ms) L-R Lat (ms) L Amp (V)  R Amp (V) L-R Amp (%) Site1 Site2 L Vel (m/s) R Vel (m/s) L-R Vel (m/s)  Median Acr Palm Anti Sensory (2nd Digit)  32.1C  Wrist *4.2 *4.4 0.2 41.6 29.4 29.3 Wrist Palm     Palm 1.8 1.8 0.0 6.7 16.4 59.1       Radial Anti Sensory (Base 1st Digit)  31.7C  Wrist 2.0 2.1 0.1 49.2 25.6 48.0 Wrist Base 1st Digit     Ulnar Anti Sensory (5th Digit)  32.1C  Wrist 3.3 3.2 0.1 25.7 21.1 17.9 Wrist 5th Digit 42 44 2   Motor Left/Right Comparison   Stim Site L Lat (ms) R Lat (ms) L-R Lat (ms) L Amp (mV) R Amp (mV) L-R Amp (%) Site1 Site2 L Vel (m/s) R Vel (m/s) L-R Vel (m/s)  Median Motor (Abd Poll Brev)  31.8C  Wrist 4.1 4.1 0.0 7.2 9.2 21.7 Elbow Wrist *48 *49 1  Elbow 8.3 8.2 0.1 7.1 9.3 23.7       Ulnar Motor (Abd Dig Min)  32C  Wrist 3.0 2.7 0.3 9.5 13.0 *26.9 B Elbow Wrist 54 55 1  B Elbow 6.8 6.4 0.4 8.9 12.3 27.6 A Elbow B Elbow 91 79 12  A Elbow 7.9 7.8 0.1 8.8 11.7 24.8          Waveforms:
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
## 2019-10-12 ENCOUNTER — Encounter: Payer: Self-pay | Admitting: Orthopedic Surgery

## 2019-10-12 ENCOUNTER — Other Ambulatory Visit: Payer: Self-pay

## 2019-10-12 ENCOUNTER — Ambulatory Visit (INDEPENDENT_AMBULATORY_CARE_PROVIDER_SITE_OTHER): Payer: BC Managed Care – PPO | Admitting: Orthopedic Surgery

## 2019-10-12 VITALS — BP 152/87 | HR 82 | Ht 63.0 in | Wt 210.0 lb

## 2019-10-12 DIAGNOSIS — G5603 Carpal tunnel syndrome, bilateral upper limbs: Secondary | ICD-10-CM | POA: Diagnosis not present

## 2019-10-12 NOTE — Progress Notes (Signed)
Chief Complaint  Patient presents with  . Carpal Tunnel    review nerve study    49 year old female with bilateral carpal tunnel symptoms sent for nerve conduction study which shows she has bilateral carpal tunnel syndrome   Dr. 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      There is no significant electrodiagnostic evidence of any other focal nerve entrapment brachial plexopathy, cervical radiculopathy or generalized peripheral neuropathy.     **This electrodiagnostic study cannot rule out small fiber polyneuropathy and dysesthesias from central pain syndromes such as stroke or central pain sensitization syndromes such as fibromyalgia.  Myotomal referral pain from trigger points is also not excluded.   Based on the study decision today was made to do surgery sequentially left than the right  However, She is not ready to have surgery she needs to go back to work once, and February 1 which is okay.  We will set her up for surgery in June and her choice we will see her in May to set the surgery up  She is told that she can come back if she is having symptoms sooner

## 2019-10-12 NOTE — Patient Instructions (Signed)
Return to work February 1

## 2019-11-04 ENCOUNTER — Telehealth: Payer: Self-pay | Admitting: Radiology

## 2019-11-04 NOTE — Telephone Encounter (Signed)
Patient states she is at the urgent care [facility], she wants a shot for her Carpal tunnel syndrome, and is seeing if they will do it there now.  She is not ready to schedule surgery  I told her to let me know what they do / to you [facility]

## 2019-11-04 NOTE — Telephone Encounter (Signed)
Patient called, left voice mail asking for you all to please call her about her CTS, she is having a lot of pain.

## 2019-11-12 ENCOUNTER — Other Ambulatory Visit: Payer: Self-pay | Admitting: Orthopedic Surgery

## 2019-11-12 ENCOUNTER — Telehealth: Payer: Self-pay | Admitting: Orthopedic Surgery

## 2019-11-12 MED ORDER — PREDNISONE 10 MG (48) PO TBPK: ORAL_TABLET | Freq: Every day | ORAL | 0 refills | Status: DC

## 2019-11-12 NOTE — Telephone Encounter (Signed)
See note from patient, please advise.  

## 2019-11-12 NOTE — Telephone Encounter (Signed)
[name] called and left message stating that she went to Urgent Care on the 17th.  She said they gave her Prednisone at that time.  She said she felt much better after taking that.  She has finished that prescription.  She asks that Dr. Aline Brochure do a prescription for her for Prednisone.  She uses Paediatric nurse

## 2019-11-12 NOTE — Progress Notes (Signed)
p 

## 2020-01-27 ENCOUNTER — Ambulatory Visit: Payer: BC Managed Care – PPO | Admitting: Orthopedic Surgery

## 2020-01-28 ENCOUNTER — Encounter: Payer: Self-pay | Admitting: Orthopedic Surgery

## 2020-02-10 ENCOUNTER — Encounter: Payer: Self-pay | Admitting: Emergency Medicine

## 2020-02-10 ENCOUNTER — Telehealth: Payer: Self-pay | Admitting: Emergency Medicine

## 2020-02-10 ENCOUNTER — Ambulatory Visit
Admission: EM | Admit: 2020-02-10 | Discharge: 2020-02-10 | Disposition: A | Discharge status: Home / Self Care | Payer: Self-pay | Attending: Emergency Medicine | Admitting: Emergency Medicine

## 2020-02-10 ENCOUNTER — Other Ambulatory Visit: Payer: Self-pay

## 2020-02-10 DIAGNOSIS — K0889 Other specified disorders of teeth and supporting structures: Secondary | ICD-10-CM

## 2020-02-10 DIAGNOSIS — R220 Localized swelling, mass and lump, head: Secondary | ICD-10-CM

## 2020-02-10 DIAGNOSIS — K029 Dental caries, unspecified: Secondary | ICD-10-CM

## 2020-02-10 DIAGNOSIS — K047 Periapical abscess without sinus: Secondary | ICD-10-CM

## 2020-02-10 MED ORDER — IBUPROFEN 800 MG PO TABS
800.0000 mg | ORAL_TABLET | Freq: Three times a day (TID) | ORAL | 0 refills | Status: DC
Start: 2020-02-10 — End: 2020-03-31

## 2020-02-10 MED ORDER — IBUPROFEN 800 MG PO TABS: 800.0000 mg | ORAL_TABLET | Freq: Three times a day (TID) | ORAL | 0 refills | Status: DC

## 2020-02-10 MED ORDER — AMOXICILLIN-POT CLAVULANATE 875-125 MG PO TABS: 1.0000 | ORAL_TABLET | Freq: Two times a day (BID) | ORAL | 0 refills | Status: DC

## 2020-02-10 MED ORDER — CHLORHEXIDINE GLUCONATE 0.12 % MT SOLN: 15.0000 mL | Freq: Two times a day (BID) | OROMUCOSAL | 0 refills | Status: AC

## 2020-02-10 NOTE — ED Triage Notes (Signed)
Pt here for right upper dental pain with swelling in right side of face and under eye x 2 days

## 2020-02-10 NOTE — ED Provider Notes (Signed)
[place]   [id number] 02/10/20 Arrival Time: 9833  CC: DENTAL PAIN  SUBJECTIVE:  [name] is a 49 y.o. female who presents to the urgent care for complaint of dental pain and facial swelling for the past 2 days.  Denies a precipitating event or trauma.  Localizes pain to the right upper gum.  Has tried OTC analgesics without relief.  Worse with chewing.  Does not see a dentist regularly.  Reports similar symptoms in the past.  Denies fever, chills, dysphagia, odynophagia, oral or neck swelling, nausea, vomiting, chest pain, SOB.    ROS: As per HPI.  All other pertinent ROS negative.     Past Medical History:  Diagnosis Date  . Anemia   . Diabetes mellitus without complication ([place])   . Fibroids   . Hypertension   . Sickle cell trait [facility])    Past Surgical History:  Procedure Laterality Date  . ABDOMINAL HYSTERECTOMY N/A 10/04/2015   Procedure: HYSTERECTOMY ABDOMINAL;  Surgeon: Jonnie Kind, MD;  Location: [facility];  Service: Gynecology;  Laterality: N/A;  . BILATERAL SALPINGECTOMY Bilateral 10/04/2015   Procedure: BILATERAL SALPINGECTOMY;  Surgeon: Jonnie Kind, MD;  Location: [facility];  Service: Gynecology;  Laterality: Bilateral;  . TUBAL LIGATION     No Known Allergies No current facility-administered medications on file prior to encounter.   Current Outpatient Medications on File Prior to Encounter  Medication Sig Dispense Refill  . amLODipine (NORVASC) 10 MG tablet Take 1 tablet (10 mg total) by mouth every morning.    [name] atenolol-chlorthalidone (TENORETIC) 100-25 MG tablet Take 1 tablet by mouth daily.    [name] atorvastatin (LIPITOR) 10 MG tablet Take 10 mg by mouth daily.    . blood glucose meter kit and supplies Dispense based on patient and insurance preference. Use up to four times daily as directed. (FOR ICD-10 E10.9, E11.9). 1 each 0  . escitalopram (LEXAPRO) 20 MG tablet Take 20 mg by mouth daily.     . insulin aspart (NOVOLOG FLEXPEN) 100 UNIT/ML  FlexPen Inject 15 Units into the skin 3 (three) times daily with meals. DO NOT TAKE IF YOU DON'T EAT OR EAT A VERY LOW CARB MEAL. 15 mL 11  . Insulin Glargine (LANTUS) 100 UNIT/ML Solostar Pen Inject 45 Units into the skin daily. 15 mL 0  . Insulin Pen Needle 31G X 5 MM MISC 1 Device by Does not apply route as directed. 30 each 0  . lisinopril-hydrochlorothiazide (PRINZIDE,ZESTORETIC) 10-12.5 MG tablet Take 1 tablet by mouth daily.  2  . meloxicam (MOBIC) 7.5 MG tablet Take 1 tablet (7.5 mg total) by mouth daily. 30 tablet 5  . metFORMIN (GLUCOPHAGE-XR) 500 MG 24 hr tablet Take 2 tablets (1,000 mg total) by mouth 2 (two) times daily with a meal for 30 days. 120 tablet 0  . montelukast (SINGULAIR) 10 MG tablet Take 10 mg by mouth at bedtime.     . potassium chloride (K-DUR) 10 MEQ tablet Take 1 tablet (10 mEq total) by mouth daily for 30 days. (Patient not taking: Reported on 07/20/2019) 30 tablet 0  . predniSONE (STERAPRED UNI-PAK 48 TAB) 10 MG (48) TBPK tablet Take by mouth daily. 48 tablet 0   Social History   Socioeconomic History  . Marital status: Married    Spouse name: Not on file  . Number of children: Not on file  . Years of education: Not on file  . Highest education level: Not on file  Occupational History  .  Not on file  Tobacco Use  . Smoking status: Current Every Day Smoker    Packs/day: 0.50    Years: 28.00    Pack years: 14.00    Types: Cigarettes  . Smokeless tobacco: Never Used  Substance and Sexual Activity  . Alcohol use: Yes    Comment: occ.  . Drug use: No  . Sexual activity: Yes    Birth control/protection: Surgical  Other Topics Concern  . Not on file  Social History Narrative  . Not on file   Social Determinants of Health   Financial Resource Strain:   . Difficulty of Paying Living Expenses:   Food Insecurity:   . Worried About Charity fundraiser in the Last Year:   . Arboriculturist in the Last Year:   Transportation Needs:   . Lexicographer (Medical):   [name] Lack of Transportation (Non-Medical):   Physical Activity:   . Days of Exercise per Week:   . Minutes of Exercise per Session:   Stress:   . Feeling of Stress :   Social Connections:   . Frequency of Communication with Friends and Family:   . Frequency of Social Gatherings with Friends and Family:   . Attends Religious Services:   . Active Member of Clubs or Organizations:   . Attends Archivist Meetings:   [name] Marital Status:   Intimate Partner Violence:   . Fear of Current or Ex-Partner:   . Emotionally Abused:   [name] Physically Abused:   . Sexually Abused:    Family History  Problem Relation Age of Onset  . Diabetes Father   . Asthma Brother   . Hypertension Mother   . Allergic rhinitis Neg Hx   . Eczema Neg Hx   . Immunodeficiency Neg Hx   . Urticaria Neg Hx     OBJECTIVE:  Vitals:   02/10/20 1659  BP: (!) 199/101  Pulse: 68  Resp: 18  Temp: 98.5 F (36.9 C)  TempSrc: Oral  SpO2: 98%    Physical Exam Vitals and nursing note reviewed.  Constitutional:      General: She is not in acute distress.    Appearance: Normal appearance. She is normal weight. She is not ill-appearing, toxic-appearing or diaphoretic.  HENT:     Mouth/Throat:     Lips: Pink.     Mouth: Mucous membranes are moist.     Dentition: Abnormal dentition. Dental caries and dental abscesses present.     Tongue: No lesions.     Palate: No mass.      Comments: Dental caries and dental abscess present Cardiovascular:     Rate and Rhythm: Normal rate and regular rhythm.     Pulses: Normal pulses.     Heart sounds: Normal heart sounds. No murmur. No friction rub. No gallop.   Pulmonary:     Effort: Pulmonary effort is normal. No respiratory distress.     Breath sounds: Normal breath sounds. No stridor. No wheezing, rhonchi or rales.  Chest:     Chest wall: No tenderness.  Neurological:     Mental Status: She is alert.     ASSESSMENT & PLAN:  1.  Pain, dental   2. Dental caries   3. Abscess, dental   4. Facial swelling     Meds ordered this encounter  Medications  . amoxicillin-clavulanate (AUGMENTIN) 875-125 MG tablet    Sig: Take 1 tablet by mouth every 12 (twelve) hours.  Dispense:  14 tablet    Refill:  0  . ibuprofen (ADVIL) 800 MG tablet    Sig: Take 1 tablet (800 mg total) by mouth 3 (three) times daily.    Dispense:  30 tablet    Refill:  0  . chlorhexidine (PERIDEX) 0.12 % solution    Sig: Use as directed 15 mLs in the mouth or throat 2 (two) times daily.    Dispense:  120 mL    Refill:  0    Discharge instructions Augmentin prescribed.  Use as directed for pain relief Chlorhexidine mouthwash was prescribed Ibuprofen as prescribed for pain Recommend soft diet until evaluated by dentist Maintain oral hygiene care Follow up with dentist as soon as possible for further evaluation and treatment  Return or go to the ED if you have any new or worsening symptoms such as fever, chills, difficulty swallowing, painful swallowing, oral or neck swelling, nausea, vomiting, chest pain, SOB, etc...  Reviewed expectations re: course of current medical issues. Questions answered. Outlined signs and symptoms indicating need for more acute intervention. Patient verbalized understanding. After Visit Summary given.   Emerson Monte, [place] 02/10/20 1706

## 2020-02-10 NOTE — Discharge Instructions (Addendum)
Augmentin prescribed.  Use as directed for pain relief Chlorhexidine mouthwash was prescribed Ibuprofen as prescribed for pain Recommend soft diet until evaluated by dentist Maintain oral hygiene care Follow up with dentist as soon as possible for further evaluation and treatment  Return or go to the ED if you have any new or worsening symptoms such as fever, chills, difficulty swallowing, painful swallowing, oral or neck swelling, nausea, vomiting, chest pain, SOB, etc..[name]

## 2020-03-31 ENCOUNTER — Emergency Department (HOSPITAL_COMMUNITY)
Admission: EM | Admit: 2020-03-31 | Discharge: 2020-03-31 | Discharge status: Against Medical Advice | Payer: PRIVATE HEALTH INSURANCE | Attending: Emergency Medicine | Admitting: Emergency Medicine

## 2020-03-31 ENCOUNTER — Encounter (HOSPITAL_COMMUNITY): Payer: Self-pay

## 2020-03-31 ENCOUNTER — Other Ambulatory Visit: Payer: Self-pay

## 2020-03-31 DIAGNOSIS — R109 Unspecified abdominal pain: Secondary | ICD-10-CM | POA: Diagnosis present

## 2020-03-31 DIAGNOSIS — Z5321 Procedure and treatment not carried out due to patient leaving prior to being seen by health care provider: Secondary | ICD-10-CM | POA: Insufficient documentation

## 2020-03-31 DIAGNOSIS — M549 Dorsalgia, unspecified: Secondary | ICD-10-CM | POA: Diagnosis not present

## 2020-03-31 LAB — URINALYSIS, ROUTINE W REFLEX MICROSCOPIC
Bilirubin Urine: NEGATIVE
Glucose, UA: 50 mg/dL — AB
Ketones, ur: NEGATIVE mg/dL
Leukocytes,Ua: NEGATIVE
Nitrite: NEGATIVE
Protein, ur: NEGATIVE mg/dL
Specific Gravity, Urine: 1.011 (ref 1.005–1.030)
pH: 5 (ref 5.0–8.0)

## 2020-03-31 NOTE — ED Triage Notes (Signed)
Left flank pain x3 days. +urinary frequency.denies ever having a stone before.
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
## 2021-11-27 ENCOUNTER — Ambulatory Visit
Admission: EM | Admit: 2021-11-27 | Discharge: 2021-11-27 | Disposition: A | Discharge status: Home / Self Care | Payer: No Typology Code available for payment source | Attending: Urgent Care | Admitting: Urgent Care

## 2021-11-27 ENCOUNTER — Other Ambulatory Visit: Payer: Self-pay

## 2021-11-27 DIAGNOSIS — M5442 Lumbago with sciatica, left side: Secondary | ICD-10-CM

## 2021-11-27 DIAGNOSIS — I10 Essential (primary) hypertension: Secondary | ICD-10-CM

## 2021-11-27 DIAGNOSIS — E119 Type 2 diabetes mellitus without complications: Secondary | ICD-10-CM

## 2021-11-27 DIAGNOSIS — Z794 Long term (current) use of insulin: Secondary | ICD-10-CM

## 2021-11-27 DIAGNOSIS — S39012A Strain of muscle, fascia and tendon of lower back, initial encounter: Secondary | ICD-10-CM

## 2021-11-27 MED ORDER — TIZANIDINE HCL 4 MG PO TABS: 4.0000 mg | ORAL_TABLET | Freq: Every day | ORAL | 0 refills | Status: AC

## 2021-11-27 MED ORDER — KETOROLAC TROMETHAMINE 30 MG/ML IJ SOLN
30.0000 mg | Freq: Once | INTRAMUSCULAR | Status: AC
  Administered 2021-11-27: 30 mg via INTRAMUSCULAR

## 2021-11-27 MED ORDER — ACETAMINOPHEN 325 MG PO TABS: 650.0000 mg | ORAL_TABLET | Freq: Four times a day (QID) | ORAL | 0 refills | Status: DC | PRN

## 2021-11-27 NOTE — ED Provider Notes (Signed)
?[facility] ? ? ?MRN: [id number] DOB: 05-Apr-1971 ? ?Subjective:  ? ?[name] is a 51 y.o. female presenting for acute onset this morning of low back pain.  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  Has do a lot of lifting.  Denies any fall, trauma, weakness, numbness or tingling.  She is a type II diabetic treated with insulin and is uncontrolled.  She also has a history of hypertension. ? ?No current facility-administered medications for this encounter. ? ?Current Outpatient Medications:  ?  amLODipine (NORVASC) 10 MG tablet, Take 1 tablet (10 mg total) by mouth every morning., Disp: , Rfl:  ?  atorvastatin (LIPITOR) 10 MG tablet, Take 10 mg by mouth daily., Disp: , Rfl:  ?  chlorhexidine (PERIDEX) 0.12 % solution, Use as directed 15 mLs in the mouth or throat 2 (two) times daily., Disp: 120 mL, Rfl: 0 ?  escitalopram (LEXAPRO) 20 MG tablet, Take 20 mg by mouth daily. , Disp: , Rfl:  ?  insulin aspart (NOVOLOG FLEXPEN) 100 UNIT/ML FlexPen, Inject 15 Units into the skin 3 (three) times daily with meals. DO NOT TAKE IF YOU DON'T EAT OR EAT A VERY LOW CARB MEAL., Disp: 15 mL, Rfl: 11 ?  Insulin Glargine (LANTUS) 100 UNIT/ML Solostar Pen, Inject 45 Units into the skin daily. (Patient taking differently: Inject 45 Units into the skin in the morning. ), Disp: 15 mL, Rfl: 0 ?  lisinopril-hydrochlorothiazide (PRINZIDE,ZESTORETIC) 10-12.5 MG tablet, Take 1 tablet by mouth daily., Disp: , Rfl: 2 ?  metFORMIN (GLUCOPHAGE-XR) 500 MG 24 hr tablet, Take 2 tablets (1,000 mg total) by mouth 2 (two) times daily with a meal for 30 days., Disp: 120 tablet, Rfl: 0 ?  montelukast (SINGULAIR) 10 MG tablet, Take 10 mg by mouth at bedtime. , Disp: , Rfl:   ? ?No Known Allergies ? ?Past Medical History:  ?Diagnosis Date  ? Anemia   ? Diabetes mellitus without complication ([place])   ? Fibroids   ? Hypertension   ? Sickle cell trait ([place])   ?  ? ?Past Surgical History:  ?Procedure Laterality Date  ?  ABDOMINAL HYSTERECTOMY N/A 10/04/2015  ? Procedure: HYSTERECTOMY ABDOMINAL;  Surgeon: Jonnie Kind, MD;  Location: [facility];  Service: Gynecology;  Laterality: N/A;  ? BILATERAL SALPINGECTOMY Bilateral 10/04/2015  ? Procedure: BILATERAL SALPINGECTOMY;  Surgeon: Jonnie Kind, MD;  Location: [facility];  Service: Gynecology;  Laterality: Bilateral;  ? TUBAL LIGATION    ? ? ?Family History  ?Problem Relation Age of Onset  ? Diabetes Father   ? Asthma Brother   ? Hypertension Mother   ? Allergic rhinitis Neg Hx   ? Eczema Neg Hx   ? Immunodeficiency Neg Hx   ? Urticaria Neg Hx   ? ? ?Social History  ? ?Tobacco Use  ? Smoking status: Every Day  ?  Packs/day: 0.50  ?  Years: 28.00  ?  Pack years: 14.00  ?  Types: Cigarettes  ? Smokeless tobacco: Never  ?Vaping Use  ? Vaping Use: Never used  ?Substance Use Topics  ? Alcohol use: Yes  ?  Comment: occ.  ? Drug use: No  ? ? ?ROS ? ? ?Objective:  ? ?Vitals: ?BP (!) 189/99 (BP Location: Right Arm)   Pulse 67   Temp 98.3 ?F (36.8 ?C) (Oral)   Resp 18   LMP 06/23/2015   SpO2 97%  ? ?BP recheck was 181/100. ? ?Physical Exam ?Constitutional:   ?  General: She is not in acute distress. ?   Appearance: Normal appearance. She is well-developed. She is not ill-appearing, toxic-appearing or diaphoretic.  ?HENT:  ?   Head: Normocephalic and atraumatic.  ?   Nose: Nose normal.  ?   Mouth/Throat:  ?   Mouth: Mucous membranes are moist.  ?Eyes:  ?   General: No scleral icterus.    ?   Right eye: No discharge.     ?   Left eye: No discharge.  ?   Extraocular Movements: Extraocular movements intact.  ?Cardiovascular:  ?   Rate and Rhythm: Normal rate.  ?   Heart sounds: No murmur heard. ?  No friction rub. No gallop.  ?Pulmonary:  ?   Effort: Pulmonary effort is normal. No respiratory distress.  ?   Breath sounds: No stridor. No wheezing, rhonchi or rales.  ?Chest:  ?   Chest wall: No tenderness.  ?Musculoskeletal:  ?   Comments: Full range of motion throughout.  Strength 5/5 for lower  extremities.  Patient ambulates without any assistance at expected pace.  No ecchymosis, swelling, lacerations or abrasions.  Patient does have paraspinal muscle tenderness along the left lumbar region back excluding the midline.  Positive straight leg raise to the left.  ?Skin: ?   General: Skin is warm and dry.  ?Neurological:  ?   General: No focal deficit present.  ?   Mental Status: She is alert and oriented to person, place, and time.  ?   Motor: No weakness.  ?   Coordination: Coordination normal.  ?   Gait: Gait normal.  ?   Deep Tendon Reflexes: Reflexes normal.  ?Psychiatric:     ?   Mood and Affect: Mood normal.     ?   Behavior: Behavior normal.     ?   Thought Content: Thought content normal.     ?   Judgment: Judgment normal.  ? ? ?Assessment and Plan :  ? ?PDMP not reviewed this encounter. ? ?1. Lumbar strain, initial encounter   ?2. Acute left-sided low back pain with left-sided sciatica   ?3. Type 2 diabetes mellitus treated with insulin ([place])   ?4. Essential hypertension   ? ?I have to avoid steroids and NSAIDs due to her diabetes and high blood pressure.  Offered a one-time Toradol injection in clinic to help with her low back pain, sciatica type pain secondary to her lumbar strain.  Recommended Tylenol and tizanidine otherwise.  Follow-up with PCP as soon as possible regarding her blood pressure and blood sugar. Counseled patient on potential for adverse effects with medications prescribed/recommended today, ER and return-to-clinic precautions discussed, patient verbalized understanding. ? ?  ?Jaynee Eagles, PA-C ?11/27/21 1728 ? ?

## 2021-11-27 NOTE — ED Triage Notes (Signed)
Pt reports left buttock pain x 1 day. Biofreeze and ibuprofen gives no relief.  ?

## 2021-11-27 NOTE — Discharge Instructions (Addendum)

## 2021-11-29 ENCOUNTER — Other Ambulatory Visit: Payer: Self-pay

## 2021-11-29 ENCOUNTER — Ambulatory Visit
Admission: EM | Admit: 2021-11-29 | Discharge: 2021-11-29 | Disposition: A | Discharge status: Home / Self Care | Payer: No Typology Code available for payment source | Attending: Family Medicine | Admitting: Family Medicine

## 2021-11-29 DIAGNOSIS — G5702 Lesion of sciatic nerve, left lower limb: Secondary | ICD-10-CM

## 2021-11-29 MED ORDER — KETOROLAC TROMETHAMINE 30 MG/ML IJ SOLN
30.0000 mg | Freq: Once | INTRAMUSCULAR | Status: AC
  Administered 2021-11-29: 30 mg via INTRAMUSCULAR

## 2021-11-29 MED ORDER — TIZANIDINE HCL 6 MG PO CAPS: 6.0000 mg | ORAL_CAPSULE | Freq: Three times a day (TID) | ORAL | 0 refills | Status: DC | PRN

## 2021-11-29 NOTE — ED Provider Notes (Signed)
?[facility] ? ? ? ?CSN: [id number] ?Arrival date & time: 11/29/21  0931 ? ? ?  ? ?History   ?Chief Complaint ?Chief Complaint  ?Patient presents with  ? Hip Pain  ?  Left hip pain  ? ? ?HPI ?[name] is a 51 y.o. female.  ? ?Presenting today following up on 2-day history of left posterior buttock pain radiating down the left leg.  Was seen 2 days ago for this as well, given shot of Toradol, Zanaflex, Tylenol which she states is not helpful.  Denies bowel or bladder incontinence, saddle anesthesia, leg weakness or numbness, fever.  Has been trying to do stretches, ice, heat also with minimal relief. ? ? ?Past Medical History:  ?Diagnosis Date  ? Anemia   ? Diabetes mellitus without complication ([place])   ? Fibroids   ? Hypertension   ? Sickle cell trait ([place])   ? ? ?Patient Active Problem List  ? Diagnosis Date Noted  ? DM (diabetes mellitus) type 2, uncontrolled, with ketoacidosis ([place]) 10/25/2018  ? DKA (diabetic ketoacidoses) 10/24/2018  ? Hypertension associated with diabetes ([place]) 10/24/2018  ? AKI (acute kidney injury) ([place]) 10/24/2018  ? Hypokalemia 10/24/2018  ? Bronchitis 10/24/2018  ? S/P total abdominal hysterectomy 10/04/2015  ? Excessive or frequent menstruation 07/20/2013  ? OBESITY 01/07/2008  ? Tobacco use 01/07/2008  ? ? ?Past Surgical History:  ?Procedure Laterality Date  ? ABDOMINAL HYSTERECTOMY N/A 10/04/2015  ? Procedure: HYSTERECTOMY ABDOMINAL;  Surgeon: Jonnie Kind, MD;  Location: [facility];  Service: Gynecology;  Laterality: N/A;  ? BILATERAL SALPINGECTOMY Bilateral 10/04/2015  ? Procedure: BILATERAL SALPINGECTOMY;  Surgeon: Jonnie Kind, MD;  Location: [facility];  Service: Gynecology;  Laterality: Bilateral;  ? TUBAL LIGATION    ? ? ?OB History   ? ? Gravida  ?4  ? Para  ?4  ? Term  ?4  ? Preterm  ?   ? AB  ?   ? Living  ?4  ?  ? ? SAB  ?   ? IAB  ?   ? Ectopic  ?   ? Multiple  ?   ? Live Births  ?   ?   ?  ?  ? ? ? ?Home Medications   ? ?Prior to Admission medications    ?Medication Sig Start Date End Date Taking? Authorizing Provider  ?tizanidine (ZANAFLEX) 6 MG capsule Take 1 capsule (6 mg total) by mouth 3 (three) times daily as needed for muscle spasms. Do not drink alcohol or drive while taking this medication. May cause drowsiness 11/29/21  Yes Volney American, PA-C  ?acetaminophen (TYLENOL) 325 MG tablet Take 2 tablets (650 mg total) by mouth every 6 (six) hours as needed for moderate pain. 11/27/21   Jaynee Eagles, PA-C  ?amLODipine (NORVASC) 10 MG tablet Take 1 tablet (10 mg total) by mouth every morning. 10/26/18   Johnson, Clanford L, MD  ?atorvastatin (LIPITOR) 10 MG tablet Take 10 mg by mouth daily. 07/16/19   [provider]  ?chlorhexidine (PERIDEX) 0.12 % solution Use as directed 15 mLs in the mouth or throat 2 (two) times daily. 02/10/20   Emerson Monte, FNP  ?escitalopram (LEXAPRO) 20 MG tablet Take 20 mg by mouth daily.  10/15/18   [provider]  ?insulin aspart (NOVOLOG FLEXPEN) 100 UNIT/ML FlexPen Inject 15 Units into the skin 3 (three) times daily with meals. DO NOT TAKE IF YOU DON'T EAT OR EAT A VERY LOW CARB MEAL.  10/26/18   Johnson, Clanford L, MD  ?Insulin Glargine (LANTUS) 100 UNIT/ML Solostar Pen Inject 45 Units into the skin daily. ?Patient taking differently: Inject 45 Units into the skin in the morning.  10/27/18   Johnson, Clanford L, MD  ?lisinopril-hydrochlorothiazide (PRINZIDE,ZESTORETIC) 10-12.5 MG tablet Take 1 tablet by mouth daily. 12/22/15   [provider]  ?metFORMIN (GLUCOPHAGE-XR) 500 MG 24 hr tablet Take 2 tablets (1,000 mg total) by mouth 2 (two) times daily with a meal for 30 days. 10/26/18 03/31/20  Johnson, Clanford L, MD  ?montelukast (SINGULAIR) 10 MG tablet Take 10 mg by mouth at bedtime.  07/27/18   [provider]  ?tiZANidine (ZANAFLEX) 4 MG tablet Take 1 tablet (4 mg total) by mouth at bedtime. 11/27/21   Jaynee Eagles, PA-C  ? ? ?Family History ?Family History  ?Problem Relation Age of Onset  ?  Diabetes Father   ? Asthma Brother   ? Hypertension Mother   ? Allergic rhinitis Neg Hx   ? Eczema Neg Hx   ? Immunodeficiency Neg Hx   ? Urticaria Neg Hx   ? ? ?Social History ?Social History  ? ?Tobacco Use  ? Smoking status: Every Day  ?  Packs/day: 0.50  ?  Years: 28.00  ?  Pack years: 14.00  ?  Types: Cigarettes  ? Smokeless tobacco: Never  ?Vaping Use  ? Vaping Use: Never used  ?Substance Use Topics  ? Alcohol use: Yes  ?  Comment: occ.  ? Drug use: No  ? ? ? ?Allergies   ?Patient has no known allergies. ? ? ?Review of Systems ?Review of Systems ?Per HPI ? ?Physical Exam ?Triage Vital Signs ?ED Triage Vitals  ?Enc Vitals Group  ?   BP 11/29/21 0945 (!) 179/108  ?   Pulse Rate 11/29/21 0945 66  ?   Resp 11/29/21 0945 18  ?   Temp 11/29/21 0945 97.7 ?F (36.5 ?C)  ?   Temp Source 11/29/21 0945 Oral  ?   SpO2 11/29/21 0945 95 %  ?   Weight --   ?   Height --   ?   Head Circumference --   ?   Peak Flow --   ?   Pain Score 11/29/21 0946 10  ?   Pain Loc --   ?   Pain Edu? --   ?   Excl. in [place]? --   ? ?No data found. ? ?Updated Vital Signs ?BP (!) 179/108 (BP Location: Right Arm)   Pulse 66   Temp 97.7 ?F (36.5 ?C) (Oral)   Resp 18   LMP 06/23/2015   SpO2 95%  ? ?Visual Acuity ?Right Eye Distance:   ?Left Eye Distance:   ?Bilateral Distance:   ? ?Right Eye Near:   ?Left Eye Near:    ?Bilateral Near:    ? ?Physical Exam ?Vitals and nursing note reviewed.  ?Constitutional:   ?   Appearance: Normal appearance. She is not ill-appearing.  ?HENT:  ?   Head: Atraumatic.  ?Eyes:  ?   Extraocular Movements: Extraocular movements intact.  ?   Conjunctiva/sclera: Conjunctivae normal.  ?Cardiovascular:  ?   Rate and Rhythm: Normal rate and regular rhythm.  ?   Heart sounds: Normal heart sounds.  ?Pulmonary:  ?   Effort: Pulmonary effort is normal.  ?   Breath sounds: Normal breath sounds.  ?Musculoskeletal:     ?   General: Tenderness present. No swelling or deformity. Normal range of motion.  ?  Cervical back: Normal  range of motion and neck supple.  ?   Comments: No midline spinal tenderness to palpation diffusely.  Negative straight leg raise bilaterally.  Mildly antalgic gait.  Significant tenderness to palpation over left piriformis muscle  ?Skin: ?   General: Skin is warm and dry.  ?Neurological:  ?   Mental Status: She is alert and oriented to person, place, and time.  ?   Motor: No weakness.  ?   Gait: Gait normal.  ?   Comments: Bilateral lower extremities neurovascularly intact  ?Psychiatric:     ?   Mood and Affect: Mood normal.     ?   Thought Content: Thought content normal.     ?   Judgment: Judgment normal.  ? ? ? ?UC Treatments / Results  ?Labs ?(all labs ordered are listed, but only abnormal results are displayed) ?Labs Reviewed - No data to display ? ?EKG ? ? ?Radiology ?No results found. ? ?Procedures ?Procedures (including critical care time) ? ?Medications Ordered in UC ?Medications  ?ketorolac (TORADOL) 30 MG/ML injection 30 mg (30 mg Intramuscular Given 11/29/21 1008)  ? ? ?Initial Impression / Assessment and Plan / UC Course  ?I have reviewed the triage vital signs and the nursing notes. ? ?Pertinent labs & imaging results that were available during my care of the patient were reviewed by me and considered in my medical decision making (see chart for details). ? ?  ? ?We will increase the muscle relaxer dose and provide another dose of low-dose Toradol IM today in clinic.  Continue Tylenol, stretches, heat, rest.  Work note given.  Return for any acutely worsening symptoms. ? ?Final Clinical Impressions(s) / UC Diagnoses  ? ?Final diagnoses:  ?Piriformis syndrome of left side  ? ?Discharge Instructions   ?None ?  ? ?ED Prescriptions   ? ? Medication Sig Dispense Auth. Provider  ? tizanidine (ZANAFLEX) 6 MG capsule Take 1 capsule (6 mg total) by mouth 3 (three) times daily as needed for muscle spasms. Do not drink alcohol or drive while taking this medication. May cause drowsiness 15 capsule Volney American, [place]  ? ?  ? ?PDMP not reviewed this encounter. ?  ?Volney American, PA-C ?11/29/21 1019 ? ?

## 2021-11-29 NOTE — ED Triage Notes (Signed)
Pt states she came in 2 days ago for left hip pain and was given a shot and a muscle relaxer but it has not helped any ? ?Pt states that last night the pain went from her left hip and down her left leg ? ?Pt states sh has tried heat and ice ?

## 2022-05-10 ENCOUNTER — Encounter: Payer: Self-pay | Admitting: *Deleted

## 2022-06-20 ENCOUNTER — Other Ambulatory Visit (HOSPITAL_COMMUNITY): Payer: Self-pay | Admitting: Family

## 2022-06-20 DIAGNOSIS — Z1231 Encounter for screening mammogram for malignant neoplasm of breast: Secondary | ICD-10-CM

## 2022-10-22 ENCOUNTER — Encounter: Payer: Self-pay | Admitting: *Deleted

## 2023-02-16 ENCOUNTER — Emergency Department (HOSPITAL_COMMUNITY): Payer: BC Managed Care – PPO

## 2023-02-16 ENCOUNTER — Emergency Department (HOSPITAL_COMMUNITY)
Admission: EM | Admit: 2023-02-16 | Discharge: 2023-02-16 | Disposition: A | Discharge status: Home / Self Care | Payer: BC Managed Care – PPO | Attending: Student | Admitting: Student

## 2023-02-16 ENCOUNTER — Other Ambulatory Visit: Payer: Self-pay

## 2023-02-16 DIAGNOSIS — S99911A Unspecified injury of right ankle, initial encounter: Secondary | ICD-10-CM | POA: Diagnosis present

## 2023-02-16 DIAGNOSIS — Z79899 Other long term (current) drug therapy: Secondary | ICD-10-CM | POA: Diagnosis not present

## 2023-02-16 DIAGNOSIS — I10 Essential (primary) hypertension: Secondary | ICD-10-CM | POA: Diagnosis not present

## 2023-02-16 DIAGNOSIS — Z794 Long term (current) use of insulin: Secondary | ICD-10-CM | POA: Diagnosis not present

## 2023-02-16 DIAGNOSIS — E119 Type 2 diabetes mellitus without complications: Secondary | ICD-10-CM | POA: Diagnosis not present

## 2023-02-16 DIAGNOSIS — M25572 Pain in left ankle and joints of left foot: Secondary | ICD-10-CM | POA: Diagnosis not present

## 2023-02-16 DIAGNOSIS — S82841A Displaced bimalleolar fracture of right lower leg, initial encounter for closed fracture: Secondary | ICD-10-CM | POA: Insufficient documentation

## 2023-02-16 DIAGNOSIS — F17210 Nicotine dependence, cigarettes, uncomplicated: Secondary | ICD-10-CM | POA: Insufficient documentation

## 2023-02-16 DIAGNOSIS — W108XXA Fall (on) (from) other stairs and steps, initial encounter: Secondary | ICD-10-CM | POA: Insufficient documentation

## 2023-02-16 DIAGNOSIS — Z7984 Long term (current) use of oral hypoglycemic drugs: Secondary | ICD-10-CM | POA: Insufficient documentation

## 2023-02-16 DIAGNOSIS — S82891A Other fracture of right lower leg, initial encounter for closed fracture: Secondary | ICD-10-CM

## 2023-02-16 MED ORDER — FENTANYL CITRATE PF 50 MCG/ML IJ SOSY
50.0000 ug | PREFILLED_SYRINGE | Freq: Once | INTRAMUSCULAR | Status: AC
  Administered 2023-02-16: 50 ug via INTRAVENOUS
  Filled 2023-02-16: qty 1

## 2023-02-16 MED ORDER — OXYCODONE HCL 5 MG PO TABS: 5.0000 mg | ORAL_TABLET | ORAL | 0 refills | Status: DC | PRN

## 2023-02-16 MED ORDER — KETOROLAC TROMETHAMINE 15 MG/ML IJ SOLN: 15.0000 mg | Freq: Once | INTRAMUSCULAR | Status: DC

## 2023-02-16 MED ORDER — PHENYLEPHRINE 80 MCG/ML (10ML) SYRINGE FOR IV PUSH (FOR BLOOD PRESSURE SUPPORT)
80.0000 ug | PREFILLED_SYRINGE | Freq: Once | INTRAVENOUS | Status: DC
  Filled 2023-02-16: qty 10

## 2023-02-16 MED ORDER — HYDROCODONE-ACETAMINOPHEN 5-325 MG PO TABS: 1.0000 | ORAL_TABLET | Freq: Once | ORAL | Status: DC

## 2023-02-16 MED ORDER — SODIUM CHLORIDE 0.9 % IV SOLN
INTRAVENOUS | Status: AC | PRN
  Administered 2023-02-16: 125 mL/h via INTRAVENOUS

## 2023-02-16 MED ORDER — PROPOFOL 10 MG/ML IV BOLUS
1.0000 mg/kg | Freq: Once | INTRAVENOUS | Status: AC
  Administered 2023-02-16: 50 mg via INTRAVENOUS
  Filled 2023-02-16: qty 20

## 2023-02-16 MED ORDER — PROPOFOL 10 MG/ML IV BOLUS
INTRAVENOUS | Status: AC | PRN
  Administered 2023-02-16 (×3): 30 mg via INTRAVENOUS

## 2023-02-16 MED ORDER — ACETAMINOPHEN 500 MG PO TABS: 1000.0000 mg | ORAL_TABLET | Freq: Three times a day (TID) | ORAL | 0 refills | Status: AC

## 2023-02-16 MED ORDER — NAPROXEN 375 MG PO TABS: 375.0000 mg | ORAL_TABLET | Freq: Two times a day (BID) | ORAL | 0 refills | Status: AC

## 2023-02-16 NOTE — ED Provider Notes (Signed)
[place] EMERGENCY DEPARTMENT AT [facility] Provider Note  CSN: [id number] Arrival date & time: 02/16/23 0830  Chief Complaint(s) Fall  HPI [name] is a 52 y.o. female with PMH T2DM, fibroids, sickle cell trait, daily alcohol use, HTN who presents emergency department for evaluation of a fall and ankle pain.  She states that this morning she fell down 2 stairs in the house and rolled both ankles.  She is endorsing some mild pain at the left ankle but is endorsing significant pain at the right and is unable to bear weight on the right.  Denies head strike, loss of consciousness, chest pain, shortness of breath, Donnell pain, nausea, vomiting, numbness, tingling, weakness or any other systemic, traumatic or neurologic complaints.   Past Medical History Past Medical History:  Diagnosis Date   Anemia    Diabetes mellitus without complication (HCC)    Fibroids    Hypertension    Sickle cell trait [facility])    Patient Active Problem List   Diagnosis Date Noted   DM (diabetes mellitus) type 2, uncontrolled, with ketoacidosis (HCC) 10/25/2018   DKA (diabetic ketoacidoses) 10/24/2018   Hypertension associated with diabetes (HCC) 10/24/2018   AKI (acute kidney injury) (HCC) 10/24/2018   Hypokalemia 10/24/2018   Bronchitis 10/24/2018   S/P total abdominal hysterectomy 10/04/2015   Excessive or frequent menstruation 07/20/2013   OBESITY 01/07/2008   Tobacco use 01/07/2008   Home Medication(s) Prior to Admission medications   Medication Sig Start Date End Date Taking? Authorizing Provider  acetaminophen (TYLENOL) 500 MG tablet Take 2 tablets (1,000 mg total) by mouth every 8 (eight) hours. 02/16/23 03/18/23 Yes Psalms Olarte, MD  naproxen (NAPROSYN) 375 MG tablet Take 1 tablet (375 mg total) by mouth 2 (two) times daily. 02/16/23  Yes Cassandria Drew, MD  oxyCODONE (ROXICODONE) 5 MG immediate release tablet Take 1 tablet (5 mg total) by mouth every 4 (four) hours as needed for  severe pain. 02/16/23  Yes Caris Cerveny, MD  amLODipine (NORVASC) 10 MG tablet Take 1 tablet (10 mg total) by mouth every morning. 10/26/18   Johnson, Clanford L, MD  atorvastatin (LIPITOR) 10 MG tablet Take 10 mg by mouth daily. 07/16/19   [provider]  chlorhexidine (PERIDEX) 0.12 % solution Use as directed 15 mLs in the mouth or throat 2 (two) times daily. 02/10/20   Avegno, Zachery Dakins, FNP  escitalopram (LEXAPRO) 20 MG tablet Take 20 mg by mouth daily.  10/15/18   [provider]  insulin aspart (NOVOLOG FLEXPEN) 100 UNIT/ML FlexPen Inject 15 Units into the skin 3 (three) times daily with meals. DO NOT TAKE IF YOU DON'T EAT OR EAT A VERY LOW CARB MEAL. 10/26/18   Johnson, Clanford L, MD  Insulin Glargine (LANTUS) 100 UNIT/ML Solostar Pen Inject 45 Units into the skin daily. Patient taking differently: Inject 45 Units into the skin in the morning.  10/27/18   Johnson, Clanford L, MD  lisinopril-hydrochlorothiazide (PRINZIDE,ZESTORETIC) 10-12.5 MG tablet Take 1 tablet by mouth daily. 12/22/15   [provider]  metFORMIN (GLUCOPHAGE-XR) 500 MG 24 hr tablet Take 2 tablets (1,000 mg total) by mouth 2 (two) times daily with a meal for 30 days. 10/26/18 03/31/20  Johnson, Clanford L, MD  montelukast (SINGULAIR) 10 MG tablet Take 10 mg by mouth at bedtime.  07/27/18   [provider]  tiZANidine (ZANAFLEX) 4 MG tablet Take 1 tablet (4 mg total) by mouth at bedtime. 11/27/21   Wallis Bamberg, PA-C  tizanidine (  ZANAFLEX) 6 MG capsule Take 1 capsule (6 mg total) by mouth 3 (three) times daily as needed for muscle spasms. Do not drink alcohol or drive while taking this medication. May cause drowsiness 11/29/21   Particia Nearing, PA-C                                                                                                                                    Past Surgical History Past Surgical History:  Procedure Laterality Date   ABDOMINAL HYSTERECTOMY N/A 10/04/2015    Procedure: HYSTERECTOMY ABDOMINAL;  Surgeon: Tilda Burrow, MD;  Location: [facility];  Service: Gynecology;  Laterality: N/A;   BILATERAL SALPINGECTOMY Bilateral 10/04/2015   Procedure: BILATERAL SALPINGECTOMY;  Surgeon: Tilda Burrow, MD;  Location: [facility];  Service: Gynecology;  Laterality: Bilateral;   TUBAL LIGATION     Family History Family History  Problem Relation Age of Onset   Diabetes Father    Asthma Brother    Hypertension Mother    Allergic rhinitis Neg Hx    Eczema Neg Hx    Immunodeficiency Neg Hx    Urticaria Neg Hx     Social History Social History   Tobacco Use   Smoking status: Every Day    Packs/day: 0.50    Years: 28.00    Additional pack years: 0.00    Total pack years: 14.00    Types: Cigarettes   Smokeless tobacco: Never  Vaping Use   Vaping Use: Never used  Substance Use Topics   Alcohol use: Yes    Comment: occ.   Drug use: No   Allergies Patient has no known allergies.  Review of Systems Review of Systems  Musculoskeletal:  Positive for arthralgias, joint swelling and myalgias.    Physical Exam Vital Signs  I have reviewed the triage vital signs BP (!) 171/99   Pulse 73   Temp 98.1 F (36.7 C) (Oral)   Resp 20   Ht 5\' 3"  (1.6 m)   Wt 90.7 kg   LMP 06/23/2015   SpO2 100%   BMI 35.43 kg/m   Physical Exam Vitals and nursing note reviewed.  Constitutional:      General: She is not in acute distress.    Appearance: She is well-developed.  HENT:     Head: Normocephalic and atraumatic.  Eyes:     Conjunctiva/sclera: Conjunctivae normal.  Cardiovascular:     Rate and Rhythm: Normal rate and regular rhythm.     Heart sounds: No murmur heard. Pulmonary:     Effort: Pulmonary effort is normal. No respiratory distress.     Breath sounds: Normal breath sounds.  Abdominal:     Palpations: Abdomen is soft.     Tenderness: There is no abdominal tenderness.  Musculoskeletal:        General: Swelling, tenderness and deformity  present.     Cervical back: Neck supple.  Skin:  General: Skin is warm and dry.     Capillary Refill: Capillary refill takes less than 2 seconds.  Neurological:     Mental Status: She is alert.  Psychiatric:        Mood and Affect: Mood normal.     ED Results and Treatments Labs (all labs ordered are listed, but only abnormal results are displayed) Labs Reviewed - No data to display                                                                                                                        Radiology DG Ankle Complete Right  Result Date: 02/16/2023 CLINICAL DATA:  Right ankle fracture-dislocation. Status post reduction. EXAM: RIGHT ANKLE - COMPLETE 3+ VIEW COMPARISON:  02/16/2023 FINDINGS: Mildly displaced oblique fracture of the distal fibula is again seen, just above the level of the tibial plafond. Mild decrease in degree of displacement since prior study. Previously seen posterior malleolar fracture is not as well visualized on this exam. Mild posterior subluxation of the talus is again noted, but decreased since previous study. A fiberglass splint has been applied. Prominent plantar calcaneal bone spur again noted. IMPRESSION: Mild decrease in displacement of oblique distal fibular fracture and posterior subluxation of talus since prior exam. Electronically Signed   By: Danae Orleans M.D.   On: 02/16/2023 11:01   DG Tibia/Fibula Right  Result Date: 02/16/2023 CLINICAL DATA:  52 year old female with history of trauma from a fall complaining of pain in the right leg. EXAM: RIGHT TIBIA AND FIBULA - 2 VIEW COMPARISON:  No priors. FINDINGS: Acute displaced oblique fracture of the lateral malleolus with approximately 1.2 cm of posterior displacement. Probable minimally displaced fracture of the posterior malleolus also noted. Disruption of the ankle mortise, with severe posterior subluxation of the talus relative to the distal tibia. Soft tissue surrounding the ankle joint are severely  swollen. IMPRESSION: 1. Acute displaced fractures of the lateral malleolus and posterior malleolus, and severe posterior subluxation, as above. Dedicated right ankle radiographs are recommended. Electronically Signed   By: Trudie Reed M.D.   On: 02/16/2023 09:29   DG Ankle Complete Left  Result Date: 02/16/2023 CLINICAL DATA:  52 year old female with history of trauma from a fall complaining of left leg and ankle pain. EXAM: LEFT ANKLE COMPLETE - 3+ VIEW COMPARISON:  Left ankle radiograph 01/27/2014. FINDINGS: Soft tissue swelling surrounding the ankle joint. No acute displaced fracture, subluxation or dislocation. Small plantar calcaneal enthesophyte incidentally noted. IMPRESSION: 1. Soft tissue swelling surrounding the ankle joint without evidence of underlying bony trauma. 2. Small plantar calcaneal spur. Electronically Signed   By: Trudie Reed M.D.   On: 02/16/2023 09:26    Pertinent labs & imaging results that were available during my care of the patient were reviewed by me and considered in my medical decision making (see MDM for details).  Medications Ordered in ED Medications  propofol (DIPRIVAN) 10 mg/mL bolus/IV push 90.7 mg (50 mg Intravenous Given 02/16/23 1019)  0.9 %  sodium chloride infusion (0 mLs Intravenous Stopped 02/16/23 1138)  propofol (DIPRIVAN) 10 mg/mL bolus/IV push (30 mg Intravenous Given 02/16/23 1022)  fentaNYL (SUBLIMAZE) injection 50 mcg (50 mcg Intravenous Given 02/16/23 1057)                                                                                                                                     Procedures .Ortho Injury Treatment  Date/Time: 02/16/2023 6:34 PM  Performed by: Glendora Score, MD Authorized by: Glendora Score, MD   Consent:    Consent obtained:  Written   Consent given by:  Patient   Risks discussed:  Fracture   Alternatives discussed:  No treatment, alternative treatment, immobilization and delayed treatmentInjury location:  ankle Location details: right ankle Injury type: fracture-dislocation Fracture type: bimalleolar Pre-procedure neurovascular assessment: neurovascularly intact Pre-procedure distal perfusion: normal Pre-procedure neurological function: normal Pre-procedure range of motion: reduced  Anesthesia: Local anesthesia used: no  Patient sedated: Yes. Refer to sedation procedure documentation for details of sedation. Manipulation performed: yes Skin traction used: yes Immobilization: splint Splint type: ankle stirrup and short leg Splint Applied by: ED Provider Supplies used: Ortho-Glass Post-procedure neurovascular assessment: post-procedure neurovascularly intact Post-procedure distal perfusion: normal Post-procedure neurological function: normal Post-procedure range of motion: improved   .Sedation  Date/Time: 02/16/2023 6:36 PM  Performed by: Glendora Score, MD Authorized by: Glendora Score, MD   Consent:    Consent obtained:  Verbal   Consent given by:  Patient   Risks discussed:  Allergic reaction, dysrhythmia, inadequate sedation, nausea, prolonged hypoxia resulting in organ damage, prolonged sedation necessitating reversal, respiratory compromise necessitating ventilatory assistance and intubation and vomiting   Alternatives discussed:  Analgesia without sedation, anxiolysis and regional anesthesia Universal protocol:    Procedure explained and questions answered to patient or proxy's satisfaction: yes     Relevant documents present and verified: yes     Test results available: yes     Imaging studies available: yes     Required blood products, implants, devices, and special equipment available: yes     Site/side marked: yes     Immediately prior to procedure, a time out was called: yes     Patient identity confirmed:  Verbally with patient Indications:    Procedure necessitating sedation performed by:  Physician performing sedation Pre-sedation assessment:    Time since  last food or drink:  0800   ASA classification: class 1 - normal, healthy patient     Mouth opening:  3 or more finger widths   Thyromental distance:  4 finger widths   Mallampati score:  I - soft palate, uvula, fauces, pillars visible   Neck mobility: normal     Pre-sedation assessments completed and reviewed: airway patency, cardiovascular function, hydration status, mental status, nausea/vomiting, pain level, respiratory function and temperature   Immediate pre-procedure details:    Reassessment: Patient reassessed immediately prior to procedure  Reviewed: vital signs, relevant labs/tests and NPO status     Verified: bag valve mask available, emergency equipment available, intubation equipment available, IV patency confirmed, oxygen available and suction available   Procedure details (see MAR for exact dosages):    Preoxygenation:  Nasal cannula   Sedation:  Propofol   Intended level of sedation: deep   Intra-procedure monitoring:  Blood pressure monitoring, cardiac monitor, continuous pulse oximetry, frequent LOC assessments, frequent vital sign checks and continuous capnometry   Intra-procedure events: none     Total Provider sedation time (minutes):  15 Post-procedure details:    Attendance: Constant attendance by certified staff until patient recovered     Recovery: Patient returned to pre-procedure baseline     Post-sedation assessments completed and reviewed: airway patency, cardiovascular function, hydration status, mental status, nausea/vomiting, pain level, respiratory function and temperature     Patient is stable for discharge or admission: yes     Procedure completion:  Tolerated well, no immediate complications   (including critical care time)  Medical Decision Making / ED Course   This patient presents to the ED for concern of fall, ankle pain, this involves an extensive number of treatment options, and is a complaint that carries with it a high risk of  complications and morbidity.  The differential diagnosis includes fracture, ligamentous injury, hematoma, contusion, ankle sprain  MDM: Patient seen emergency room for evaluation of a fall and bilateral ankle pain.  Physical exam with significant tenderness on the right, minimal tenderness at the lateral malleolus on the left.  X-ray imaging is reassuringly negative on the left but does show a bimalleolar fracture with posterior subluxation.  I spoke with Dr. Dallas Schimke of orthopedic surgery who is recommending splint and relocation of the ankle.  Propofol sedation performed and reduction attempted at bedside with splint placed.  Follow-up x-ray showing improvements of the degree of subluxation but still mild subluxation.  We are unable to get a CT for surgical planning given our CT scanner is currently broken in the ER today.  Patient's pain significantly improved after splinting and she is able to ambulate on crutches well.  Patient will follow-up early next week with orthopedic surgery and will call on Monday to schedule close follow-up.  Patient discharged with pain control and return precautions of which she voiced understanding.   Additional history obtained: -Additional history obtained from partner -External records from outside source obtained and reviewed including: Chart review including previous notes, labs, imaging, consultation notes     Imaging Studies ordered: I ordered imaging studies including x-ray ankle bilaterally I independently visualized and interpreted imaging. I agree with the radiologist interpretation   Medicines ordered and prescription drug management: Meds ordered this encounter  Medications   DISCONTD: HYDROcodone-acetaminophen (NORCO/VICODIN) 5-325 MG per tablet 1 tablet   DISCONTD: ketorolac (TORADOL) 15 MG/ML injection 15 mg   propofol (DIPRIVAN) 10 mg/mL bolus/IV push 90.7 mg   DISCONTD: PHENYLephrine 80 mcg/ml in normal saline Adult IV Push Syringe (For Blood  Pressure Support)   0.9 %  sodium chloride infusion   propofol (DIPRIVAN) 10 mg/mL bolus/IV push   fentaNYL (SUBLIMAZE) injection 50 mcg   naproxen (NAPROSYN) 375 MG tablet    Sig: Take 1 tablet (375 mg total) by mouth 2 (two) times daily.    Dispense:  20 tablet    Refill:  0   acetaminophen (TYLENOL) 500 MG tablet    Sig: Take 2 tablets (1,000 mg total) by mouth every 8 (eight) hours.  Dispense:  180 tablet    Refill:  0   oxyCODONE (ROXICODONE) 5 MG immediate release tablet    Sig: Take 1 tablet (5 mg total) by mouth every 4 (four) hours as needed for severe pain.    Dispense:  14 tablet    Refill:  0    -I have reviewed the patients home medicines and have made adjustments as needed  Critical interventions none  Consultations Obtained: I requested consultation with the orthopedic surgeon on-call Dr. Dallas Schimke,  and discussed lab and imaging findings as well as pertinent plan - they recommend: Splint and outpatient follow-up   Cardiac Monitoring: The patient was maintained on a cardiac monitor.  I personally viewed and interpreted the cardiac monitored which showed an underlying rhythm of: NSR  Social Determinants of Health:  Factors impacting patients care include: none   Reevaluation: After the interventions noted above, I reevaluated the patient and found that they have :improved  Co morbidities that complicate the patient evaluation  Past Medical History:  Diagnosis Date   Anemia    Diabetes mellitus without complication (HCC)    Fibroids    Hypertension    Sickle cell trait (HCC)       Dispostion: I considered admission for this patient, but at this time she does not meet inpatient criteria for admission she is safe for discharge with outpatient follow-up     Final Clinical Impression(s) / ED Diagnoses Final diagnoses:  Closed fracture of right ankle, initial encounter     @PCDICTATION @    Glendora Score, MD 02/16/23 1839

## 2023-02-16 NOTE — ED Notes (Signed)
EKG done prior to Sedation.

## 2023-02-16 NOTE — ED Triage Notes (Signed)
Pt fell off 2 steps in the house and rolled her right ankle. No head injury. Pt states her left ankle hurts very slightly. Pt unable to bear weight on her right ankle.

## 2023-02-16 NOTE — Discharge Instructions (Addendum)
For pain:  - Acetaminophen 1000 mg three times daily (every 8 hours) - Naproxen 2 times daily (every 12 hours) - oxycodone for breakthrough pain only 

## 2023-02-18 ENCOUNTER — Ambulatory Visit: Payer: Self-pay | Admitting: Orthopedic Surgery

## 2023-02-18 DIAGNOSIS — Z01818 Encounter for other preprocedural examination: Secondary | ICD-10-CM

## 2023-02-18 DIAGNOSIS — S82841A Displaced bimalleolar fracture of right lower leg, initial encounter for closed fracture: Secondary | ICD-10-CM

## 2023-02-19 ENCOUNTER — Encounter: Payer: Self-pay | Admitting: Orthopedic Surgery

## 2023-02-19 ENCOUNTER — Ambulatory Visit (INDEPENDENT_AMBULATORY_CARE_PROVIDER_SITE_OTHER): Payer: BC Managed Care – PPO | Admitting: Orthopedic Surgery

## 2023-02-19 VITALS — BP 185/116 | HR 76 | Ht 64.5 in | Wt 200.0 lb

## 2023-02-19 DIAGNOSIS — S82841A Displaced bimalleolar fracture of right lower leg, initial encounter for closed fracture: Secondary | ICD-10-CM

## 2023-02-19 NOTE — Progress Notes (Signed)
New Patient Visit  Assessment: [name] is a 52 y.o. female with the following: 1. Closed bimalleolar fracture of right ankle, initial encounter  Plan: [name] has injured her right ankle, and sustained a bimalleolar functional equivalent injury.  I recommended operative fixation.  Plan to fix the ankle with a lateral based plate and screws, with possible syndesmotic fixation.  The ankle will be closely evaluated.  She will be in a splint for 2 weeks following surgery, then transition to a cast.  She will have to remain nonweightbearing for approximately 6 weeks after surgery.  This was discussed with the patient, and all questions have been answered.  Risks and benefits of surgery, including, but not limited to infection, bleeding, persistent pain, damage to surrounding structures, need for further surgery, malunion, nonunion and more severe complications associated with anesthesia were discussed.  All questions have been answered and they have elected to proceed with surgery.   We will plan to proceed with surgery on February 28, 2023.  She is to elevate the right leg is much as possible until surgery.  Follow-up: Return for After surgery; DOS 02/28/2023.  Subjective:  Chief Complaint  Patient presents with   Ankle Injury    R ankle DOI 02/15/23    History of Present Illness: [name] is a 52 y.o. female who presents for evaluation of right ankle pain.  Just a few days ago, she was walking down some stairs at home.  She tripped and rolled both of her ankles.  Pain was worse in the right ankle.  She was unable to bear weight.  She presented to the emergency department.  Radiographs in the emergency department demonstrated a right ankle fracture.  She was reduced, placed in a splint.  She presents to clinic today for follow-up.  She continues to have pain.  Oxycodone is not resolving her pain.  No numbness or tingling.   Review of Systems: No fevers or chills No numbness  or tingling No chest pain No shortness of breath No bowel or bladder dysfunction No GI distress No headaches   Medical History:  Past Medical History:  Diagnosis Date   Anemia    Diabetes mellitus without complication (HCC)    Fibroids    Hypertension    Sickle cell trait (HCC)     Past Surgical History:  Procedure Laterality Date   ABDOMINAL HYSTERECTOMY N/A 10/04/2015   Procedure: HYSTERECTOMY ABDOMINAL;  Surgeon: Tilda Burrow, MD;  Location: [facility];  Service: Gynecology;  Laterality: N/A;   BILATERAL SALPINGECTOMY Bilateral 10/04/2015   Procedure: BILATERAL SALPINGECTOMY;  Surgeon: Tilda Burrow, MD;  Location: [facility];  Service: Gynecology;  Laterality: Bilateral;   TUBAL LIGATION      Family History  Problem Relation Age of Onset   Diabetes Father    Asthma Brother    Hypertension Mother    Allergic rhinitis Neg Hx    Eczema Neg Hx    Immunodeficiency Neg Hx    Urticaria Neg Hx    Social History   Tobacco Use   Smoking status: Every Day    Packs/day: 0.50    Years: 28.00    Additional pack years: 0.00    Total pack years: 14.00    Types: Cigarettes   Smokeless tobacco: Never  Vaping Use   Vaping Use: Never used  Substance Use Topics   Alcohol use: Yes    Comment: occ.   Drug use: No    No  Known Allergies  Current Meds  Medication Sig   acetaminophen (TYLENOL) 500 MG tablet Take 2 tablets (1,000 mg total) by mouth every 8 (eight) hours.   amLODipine (NORVASC) 10 MG tablet Take 1 tablet (10 mg total) by mouth every morning.   atorvastatin (LIPITOR) 10 MG tablet Take 10 mg by mouth daily.   chlorhexidine (PERIDEX) 0.12 % solution Use as directed 15 mLs in the mouth or throat 2 (two) times daily.   escitalopram (LEXAPRO) 20 MG tablet Take 20 mg by mouth daily.    insulin aspart (NOVOLOG FLEXPEN) 100 UNIT/ML FlexPen Inject 15 Units into the skin 3 (three) times daily with meals. DO NOT TAKE IF YOU DON'T EAT OR EAT A VERY LOW CARB MEAL.   Insulin  Glargine (LANTUS) 100 UNIT/ML Solostar Pen Inject 45 Units into the skin daily. (Patient taking differently: Inject 45 Units into the skin in the morning.)   lisinopril-hydrochlorothiazide (PRINZIDE,ZESTORETIC) 10-12.5 MG tablet Take 1 tablet by mouth daily.   montelukast (SINGULAIR) 10 MG tablet Take 10 mg by mouth at bedtime.    naproxen (NAPROSYN) 375 MG tablet Take 1 tablet (375 mg total) by mouth 2 (two) times daily.   oxyCODONE (ROXICODONE) 5 MG immediate release tablet Take 1 tablet (5 mg total) by mouth every 4 (four) hours as needed for severe pain.   tiZANidine (ZANAFLEX) 4 MG tablet Take 1 tablet (4 mg total) by mouth at bedtime.   tizanidine (ZANAFLEX) 6 MG capsule Take 1 capsule (6 mg total) by mouth 3 (three) times daily as needed for muscle spasms. Do not drink alcohol or drive while taking this medication. May cause drowsiness    Objective: BP (!) 185/116   Pulse 76   Ht 5' 4.5" (1.638 m)   Wt 200 lb (90.7 kg) Comment: Stated  LMP 06/23/2015   BMI 33.80 kg/m   Physical Exam:  General: Alert and oriented., No acute distress., and Seated in a wheelchair. Gait: Unable to ambulate.  Evaluation of the right foot demonstrates a splint that is clean, dry and intact.  Exposed toes with minimal swelling.  Sensation intact to the exposed toes.  She is able to wiggle her toes.  No skin breakdown at the periphery.  IMAGING: I personally reviewed images previously obtained from the ED  X-rays of the right ankle demonstrates a distal fibula fracture.  There is some soft tissue injury, with subluxation of the tibiotalar joint.  Likely posterior malleolus fracture fragment, minimally displaced.  There does appear to be some medial clear space widening.   New Medications:  No orders of the defined types were placed in this encounter.     Oliver Barre, MD  02/19/2023 3:46 PM

## 2023-02-19 NOTE — Patient Instructions (Addendum)
Please provide a note for work - ok to return to light duty starting 02/22/23   Your surgery will be at [facility]  The hospital will contact you with a preoperative appointment to discuss Anesthesia. The phone number is [phone].  Please call this number to discuss this appointment   Please bring your medications with you for the appointment.  They will tell you the arrival time and medication instructions when you have your preoperative evaluation.  Do not wear nail polish the day of your surgery and if you take Phentermine you need to stop this medication ONE WEEK prior to your surgery.    If you take a blood thinning medication, we will need to stop this prior to surgery.  Typically, we stop this medicine at least 5 days prior to surgery.  We will need to confirm this with the doctor who prescribes this medication.  If you are taking medications or an injection for diabetes, or for weight management, this medicine will need to be stopped at least 7 days prior to surgery.     Surgery will be scheduled for 02/28/2023 pending authorization by your insurance company.

## 2023-02-19 NOTE — H&P (View-Only) (Signed)
New Patient Visit  Assessment: [name] is a 52 y.o. female with the following: 1. Closed bimalleolar fracture of right ankle, initial encounter  Plan: Bradi A Grennan has injured her right ankle, and sustained a bimalleolar functional equivalent injury.  I recommended operative fixation.  Plan to fix the ankle with a lateral based plate and screws, with possible syndesmotic fixation.  The ankle will be closely evaluated.  She will be in a splint for 2 weeks following surgery, then transition to a cast.  She will have to remain nonweightbearing for approximately 6 weeks after surgery.  This was discussed with the patient, and all questions have been answered.  Risks and benefits of surgery, including, but not limited to infection, bleeding, persistent pain, damage to surrounding structures, need for further surgery, malunion, nonunion and more severe complications associated with anesthesia were discussed.  All questions have been answered and they have elected to proceed with surgery.   We will plan to proceed with surgery on February 28, 2023.  She is to elevate the right leg is much as possible until surgery.  Follow-up: Return for After surgery; DOS 02/28/2023.  Subjective:  Chief Complaint  Patient presents with   Ankle Injury    R ankle DOI 02/15/23    History of Present Illness: [name] is a 52 y.o. female who presents for evaluation of right ankle pain.  Just a few days ago, she was walking down some stairs at home.  She tripped and rolled both of her ankles.  Pain was worse in the right ankle.  She was unable to bear weight.  She presented to the emergency department.  Radiographs in the emergency department demonstrated a right ankle fracture.  She was reduced, placed in a splint.  She presents to clinic today for follow-up.  She continues to have pain.  Oxycodone is not resolving her pain.  No numbness or tingling.   Review of Systems: No fevers or chills No numbness  or tingling No chest pain No shortness of breath No bowel or bladder dysfunction No GI distress No headaches   Medical History:  Past Medical History:  Diagnosis Date   Anemia    Diabetes mellitus without complication (HCC)    Fibroids    Hypertension    Sickle cell trait (HCC)     Past Surgical History:  Procedure Laterality Date   ABDOMINAL HYSTERECTOMY N/A 10/04/2015   Procedure: HYSTERECTOMY ABDOMINAL;  Surgeon: John Ferguson V, MD;  Location: [facility];  Service: Gynecology;  Laterality: N/A;   BILATERAL SALPINGECTOMY Bilateral 10/04/2015   Procedure: BILATERAL SALPINGECTOMY;  Surgeon: John Ferguson V, MD;  Location: [facility];  Service: Gynecology;  Laterality: Bilateral;   TUBAL LIGATION      Family History  Problem Relation Age of Onset   Diabetes Father    Asthma Brother    Hypertension Mother    Allergic rhinitis Neg Hx    Eczema Neg Hx    Immunodeficiency Neg Hx    Urticaria Neg Hx    Social History   Tobacco Use   Smoking status: Every Day    Packs/day: 0.50    Years: 28.00    Additional pack years: 0.00    Total pack years: 14.00    Types: Cigarettes   Smokeless tobacco: Never  Vaping Use   Vaping Use: Never used  Substance Use Topics   Alcohol use: Yes    Comment: occ.   Drug use: No    No   Known Allergies  Current Meds  Medication Sig   acetaminophen (TYLENOL) 500 MG tablet Take 2 tablets (1,000 mg total) by mouth every 8 (eight) hours.   amLODipine (NORVASC) 10 MG tablet Take 1 tablet (10 mg total) by mouth every morning.   atorvastatin (LIPITOR) 10 MG tablet Take 10 mg by mouth daily.   chlorhexidine (PERIDEX) 0.12 % solution Use as directed 15 mLs in the mouth or throat 2 (two) times daily.   escitalopram (LEXAPRO) 20 MG tablet Take 20 mg by mouth daily.    insulin aspart (NOVOLOG FLEXPEN) 100 UNIT/ML FlexPen Inject 15 Units into the skin 3 (three) times daily with meals. DO NOT TAKE IF YOU DON'T EAT OR EAT A VERY LOW CARB MEAL.   Insulin  Glargine (LANTUS) 100 UNIT/ML Solostar Pen Inject 45 Units into the skin daily. (Patient taking differently: Inject 45 Units into the skin in the morning.)   lisinopril-hydrochlorothiazide (PRINZIDE,ZESTORETIC) 10-12.5 MG tablet Take 1 tablet by mouth daily.   montelukast (SINGULAIR) 10 MG tablet Take 10 mg by mouth at bedtime.    naproxen (NAPROSYN) 375 MG tablet Take 1 tablet (375 mg total) by mouth 2 (two) times daily.   oxyCODONE (ROXICODONE) 5 MG immediate release tablet Take 1 tablet (5 mg total) by mouth every 4 (four) hours as needed for severe pain.   tiZANidine (ZANAFLEX) 4 MG tablet Take 1 tablet (4 mg total) by mouth at bedtime.   tizanidine (ZANAFLEX) 6 MG capsule Take 1 capsule (6 mg total) by mouth 3 (three) times daily as needed for muscle spasms. Do not drink alcohol or drive while taking this medication. May cause drowsiness    Objective: BP (!) 185/116   Pulse 76   Ht 5' 4.5" (1.638 m)   Wt 200 lb (90.7 kg) Comment: Stated  LMP 06/23/2015   BMI 33.80 kg/m   Physical Exam:  General: Alert and oriented., No acute distress., and Seated in a wheelchair. Gait: Unable to ambulate.  Evaluation of the right foot demonstrates a splint that is clean, dry and intact.  Exposed toes with minimal swelling.  Sensation intact to the exposed toes.  She is able to wiggle her toes.  No skin breakdown at the periphery.  IMAGING: I personally reviewed images previously obtained from the ED  X-rays of the right ankle demonstrates a distal fibula fracture.  There is some soft tissue injury, with subluxation of the tibiotalar joint.  Likely posterior malleolus fracture fragment, minimally displaced.  There does appear to be some medial clear space widening.   New Medications:  No orders of the defined types were placed in this encounter.     Yannely Kintzel A Jagdeep Ancheta, MD  02/19/2023 3:46 PM   

## 2023-02-22 NOTE — Patient Instructions (Signed)
[name]  02/22/2023     @PREFPERIOPPHARMACY @   Your procedure is scheduled on  02/28/2023.   Report to [facility] at  0700  A.M.   Call this number if you have problems the morning of surgery:  [phone]  If you experience any cold or flu symptoms such as cough, fever, chills, shortness of breath, etc. between now and your scheduled surgery, please notify us at the above number.   Remember:  Do not eat after midnight.    You may drink clear liquids until  0500 am on 02/28/2023.      Clear liquids allowed are:                    Water, Juice (No red color; non-citric and without pulp; diabetics please choose diet or no sugar options), Carbonated beverages (diabetics please choose diet or no sugar options), Clear Tea (No creamer, milk, or cream, including half & half and powdered creamer), Black Coffee Only (No creamer, milk or cream, including half & half and powdered creamer), Plain Jell-O Only (No red color; diabetics please choose no sugar options), Clear Sports drink (No red color; diabetics please choose diet or no sugar options), and Plain Popsicles Only (No red color; diabetics please choose no sugar options)       At 0500 am on 02/28/2023, drink a 12 bottle of G2 or G zero. DO NOT drink anything after this.      DO NOT take any medications for diabetes the morning of your procedure.       Take these medicines the morning of surgery with A SIP OF WATER                       amlodipine, lexapro, oxycodne(if needed), zanaflex (if needed).     Do not wear jewelry, make-up or nail polish, including gel polish,  artificial nails, or any other type of covering on natural nails (fingers and  toes).  Do not wear lotions, powders, or perfumes, or deodorant.  Do not shave 48 hours prior to surgery.  Men may shave face and neck.  Do not bring valuables to the hospital.  [facility] is not responsible for any belongings or valuables.  Contacts, dentures or  bridgework may not be worn into surgery.  Leave your suitcase in the car.  After surgery it may be brought to your room.  For patients admitted to the hospital, discharge time will be determined by your treatment team.  Patients discharged the day of surgery will not be allowed to drive home and must have someone with them for 24 hours.    Special instructions:   DO NOT smoke tobacco or vape for 24 hours before your procedure.  Please read over the following fact sheets that you were given. Pain Booklet, Coughing and Deep Breathing, Surgical Site Infection Prevention, Anesthesia Post-op Instructions, and Care and Recovery After Surgery        General Anesthesia, Adult, Care After The following information offers guidance on how to care for yourself after your procedure. Your health care provider may also give you more specific instructions. If you have problems or questions, contact your health care provider. What can I expect after the procedure? After the procedure, it is common for people to: Have pain or discomfort at the IV site. Have nausea or vomiting. Have a sore throat or hoarseness. Have trouble concentrating. Feel cold or  chills. Feel weak, sleepy, or tired (fatigue). Have soreness and body aches. These can affect parts of the body that were not involved in surgery. Follow these instructions at home: For the time period you were told by your health care provider:  Rest. Do not participate in activities where you could fall or become injured. Do not drive or use machinery. Do not drink alcohol. Do not take sleeping pills or medicines that cause drowsiness. Do not make important decisions or sign legal documents. Do not take care of children on your own. General instructions Drink enough fluid to keep your urine pale yellow. If you have sleep apnea, surgery and certain medicines can increase your risk for breathing problems. Follow instructions from your health care  provider about wearing your sleep device: Anytime you are sleeping, including during daytime naps. While taking prescription pain medicines, sleeping medicines, or medicines that make you drowsy. Return to your normal activities as told by your health care provider. Ask your health care provider what activities are safe for you. Take over-the-counter and prescription medicines only as told by your health care provider. Do not use any products that contain nicotine or tobacco. These products include cigarettes, chewing tobacco, and vaping devices, such as e-cigarettes. These can delay incision healing after surgery. If you need help quitting, ask your health care provider. Contact a health care provider if: You have nausea or vomiting that does not get better with medicine. You vomit every time you eat or drink. You have pain that does not get better with medicine. You cannot urinate or have bloody urine. You develop a skin rash. You have a fever. Get help right away if: You have trouble breathing. You have chest pain. You vomit blood. These symptoms may be an emergency. Get help right away. Call 911. Do not wait to see if the symptoms will go away. Do not drive yourself to the hospital. Summary After the procedure, it is common to have a sore throat, hoarseness, nausea, vomiting, or to feel weak, sleepy, or fatigue. For the time period you were told by your health care provider, do not drive or use machinery. Get help right away if you have difficulty breathing, have chest pain, or vomit blood. These symptoms may be an emergency. This information is not intended to replace advice given to you by your health care provider. Make sure you discuss any questions you have with your health care provider. Document Revised: 12/01/2021 Document Reviewed: 12/01/2021 Elsevier Patient Education  2024 [employer] How to Use Chlorhexidine Before Surgery Chlorhexidine gluconate (CHG) is a germ-killing  (antiseptic) solution that is used to clean the skin. It can get rid of the bacteria that normally live on the skin and can keep them away for about 24 hours. To clean your skin with CHG, you may be given: A CHG solution to use in the shower or as part of a sponge bath. A prepackaged cloth that contains CHG. Cleaning your skin with CHG may help lower the risk for infection: While you are staying in the intensive care unit of the hospital. If you have a vascular access, such as a central line, to provide short-term or long-term access to your veins. If you have a catheter to drain urine from your bladder. If you are on a ventilator. A ventilator is a machine that helps you breathe by moving air in and out of your lungs. After surgery. What are the risks? Risks of using CHG include: A skin reaction. Hearing  loss, if CHG gets in your ears and you have a perforated eardrum. Eye injury, if CHG gets in your eyes and is not rinsed out. The CHG product catching fire. Make sure that you avoid smoking and flames after applying CHG to your skin. Do not use CHG: If you have a chlorhexidine allergy or have previously reacted to chlorhexidine. On babies younger than 83 months of age. How to use CHG solution Use CHG only as told by your health care provider, and follow the instructions on the label. Use the full amount of CHG as directed. Usually, this is one bottle. During a shower Follow these steps when using CHG solution during a shower (unless your health care provider gives you different instructions): Start the shower. Use your normal soap and shampoo to wash your face and hair. Turn off the shower or move out of the shower stream. Pour the CHG onto a clean washcloth. Do not use any type of brush or rough-edged sponge. Starting at your neck, lather your body down to your toes. Make sure you follow these instructions: If you will be having surgery, pay special attention to the part of your body  where you will be having surgery. Scrub this area for at least 1 minute. Do not use CHG on your head or face. If the solution gets into your ears or eyes, rinse them well with water. Avoid your genital area. Avoid any areas of skin that have broken skin, cuts, or scrapes. Scrub your back and under your arms. Make sure to wash skin folds. Let the lather sit on your skin for 1-2 minutes or as long as told by your health care provider. Thoroughly rinse your entire body in the shower. Make sure that all body creases and crevices are rinsed well. Dry off with a clean towel. Do not put any substances on your body afterward--such as powder, lotion, or perfume--unless you are told to do so by your health care provider. Only use lotions that are recommended by the manufacturer. Put on clean clothes or pajamas. If it is the night before your surgery, sleep in clean sheets.  During a sponge bath Follow these steps when using CHG solution during a sponge bath (unless your health care provider gives you different instructions): Use your normal soap and shampoo to wash your face and hair. Pour the CHG onto a clean washcloth. Starting at your neck, lather your body down to your toes. Make sure you follow these instructions: If you will be having surgery, pay special attention to the part of your body where you will be having surgery. Scrub this area for at least 1 minute. Do not use CHG on your head or face. If the solution gets into your ears or eyes, rinse them well with water. Avoid your genital area. Avoid any areas of skin that have broken skin, cuts, or scrapes. Scrub your back and under your arms. Make sure to wash skin folds. Let the lather sit on your skin for 1-2 minutes or as long as told by your health care provider. Using a different clean, wet washcloth, thoroughly rinse your entire body. Make sure that all body creases and crevices are rinsed well. Dry off with a clean towel. Do not put any  substances on your body afterward--such as powder, lotion, or perfume--unless you are told to do so by your health care provider. Only use lotions that are recommended by the manufacturer. Put on clean clothes or pajamas. If it is the  night before your surgery, sleep in clean sheets. How to use CHG prepackaged cloths Only use CHG cloths as told by your health care provider, and follow the instructions on the label. Use the CHG cloth on clean, dry skin. Do not use the CHG cloth on your head or face unless your health care provider tells you to. When washing with the CHG cloth: Avoid your genital area. Avoid any areas of skin that have broken skin, cuts, or scrapes. Before surgery Follow these steps when using a CHG cloth to clean before surgery (unless your health care provider gives you different instructions): Using the CHG cloth, vigorously scrub the part of your body where you will be having surgery. Scrub using a back-and-forth motion for 3 minutes. The area on your body should be completely wet with CHG when you are done scrubbing. Do not rinse. Discard the cloth and let the area air-dry. Do not put any substances on the area afterward, such as powder, lotion, or perfume. Put on clean clothes or pajamas. If it is the night before your surgery, sleep in clean sheets.  For general bathing Follow these steps when using CHG cloths for general bathing (unless your health care provider gives you different instructions). Use a separate CHG cloth for each area of your body. Make sure you wash between any folds of skin and between your fingers and toes. Wash your body in the following order, switching to a new cloth after each step: The front of your neck, shoulders, and chest. Both of your arms, under your arms, and your hands. Your stomach and groin area, avoiding the genitals. Your right leg and foot. Your left leg and foot. The back of your neck, your back, and your buttocks. Do not rinse.  Discard the cloth and let the area air-dry. Do not put any substances on your body afterward--such as powder, lotion, or perfume--unless you are told to do so by your health care provider. Only use lotions that are recommended by the manufacturer. Put on clean clothes or pajamas. Contact a health care provider if: Your skin gets irritated after scrubbing. You have questions about using your solution or cloth. You swallow any chlorhexidine. Call your local poison control center ([phone] in the [place] Get help right away if: Your eyes itch badly, or they become very red or swollen. Your skin itches badly and is red or swollen. Your hearing changes. You have trouble seeing. You have swelling or tingling in your mouth or throat. You have trouble breathing. These symptoms may represent a serious problem that is an emergency. Do not wait to see if the symptoms will go away. Get medical help right away. Call your local emergency services (911 in the [place] Do not drive yourself to the hospital. Summary Chlorhexidine gluconate (CHG) is a germ-killing (antiseptic) solution that is used to clean the skin. Cleaning your skin with CHG may help to lower your risk for infection. You may be given CHG to use for bathing. It may be in a bottle or in a prepackaged cloth to use on your skin. Carefully follow your health care provider's instructions and the instructions on the product label. Do not use CHG if you have a chlorhexidine allergy. Contact your health care provider if your skin gets irritated after scrubbing. This information is not intended to replace advice given to you by your health care provider. Make sure you discuss any questions you have with your health care provider. Document Revised: 01/01/2022 Document Reviewed: 11/14/2020  Elsevier Patient Education  2023 [employer] Inc[employer]  

## 2023-02-25 ENCOUNTER — Encounter (HOSPITAL_COMMUNITY)
Admission: RE | Admit: 2023-02-25 | Discharge: 2023-02-25 | Disposition: A | Discharge status: Home / Self Care | Payer: BC Managed Care – PPO | Source: Ambulatory Visit | Attending: Orthopedic Surgery | Admitting: Orthopedic Surgery

## 2023-02-25 ENCOUNTER — Encounter (HOSPITAL_COMMUNITY): Payer: Self-pay

## 2023-02-25 ENCOUNTER — Other Ambulatory Visit: Payer: Self-pay | Admitting: Orthopedic Surgery

## 2023-02-25 VITALS — BP 136/71 | HR 76 | Temp 98.1°F | Resp 18 | Ht 64.5 in | Wt 200.0 lb

## 2023-02-25 DIAGNOSIS — K219 Gastro-esophageal reflux disease without esophagitis: Secondary | ICD-10-CM | POA: Diagnosis not present

## 2023-02-25 DIAGNOSIS — W1843XA Slipping, tripping and stumbling without falling due to stepping from one level to another, initial encounter: Secondary | ICD-10-CM | POA: Diagnosis not present

## 2023-02-25 DIAGNOSIS — J45909 Unspecified asthma, uncomplicated: Secondary | ICD-10-CM | POA: Diagnosis not present

## 2023-02-25 DIAGNOSIS — F17210 Nicotine dependence, cigarettes, uncomplicated: Secondary | ICD-10-CM | POA: Diagnosis not present

## 2023-02-25 DIAGNOSIS — S82841A Displaced bimalleolar fracture of right lower leg, initial encounter for closed fracture: Secondary | ICD-10-CM | POA: Diagnosis not present

## 2023-02-25 DIAGNOSIS — Y9301 Activity, walking, marching and hiking: Secondary | ICD-10-CM | POA: Diagnosis not present

## 2023-02-25 DIAGNOSIS — Z794 Long term (current) use of insulin: Secondary | ICD-10-CM | POA: Diagnosis not present

## 2023-02-25 DIAGNOSIS — E1065 Type 1 diabetes mellitus with hyperglycemia: Secondary | ICD-10-CM | POA: Diagnosis not present

## 2023-02-25 DIAGNOSIS — E1110 Type 2 diabetes mellitus with ketoacidosis without coma: Secondary | ICD-10-CM | POA: Insufficient documentation

## 2023-02-25 DIAGNOSIS — Z01812 Encounter for preprocedural laboratory examination: Secondary | ICD-10-CM | POA: Insufficient documentation

## 2023-02-25 DIAGNOSIS — I10 Essential (primary) hypertension: Secondary | ICD-10-CM | POA: Diagnosis not present

## 2023-02-25 DIAGNOSIS — Z79899 Other long term (current) drug therapy: Secondary | ICD-10-CM | POA: Diagnosis not present

## 2023-02-25 DIAGNOSIS — Z01818 Encounter for other preprocedural examination: Secondary | ICD-10-CM

## 2023-02-25 DIAGNOSIS — Z791 Long term (current) use of non-steroidal anti-inflammatories (NSAID): Secondary | ICD-10-CM | POA: Diagnosis not present

## 2023-02-25 HISTORY — DX: Unspecified asthma, uncomplicated: J45.909

## 2023-02-25 HISTORY — DX: Anxiety disorder, unspecified: F41.9

## 2023-02-25 LAB — BASIC METABOLIC PANEL
Anion gap: 12 (ref 5–15)
BUN: 17 mg/dL (ref 6–20)
CO2: 23 mmol/L (ref 22–32)
Calcium: 8.9 mg/dL (ref 8.9–10.3)
Chloride: 97 mmol/L — ABNORMAL LOW (ref 98–111)
Creatinine, Ser: 0.61 mg/dL (ref 0.44–1.00)
GFR, Estimated: >60 mL/min (ref >60)
Glucose, Bld: 414 mg/dL — ABNORMAL HIGH (ref 70–99)
Potassium: 3.3 mmol/L — ABNORMAL LOW (ref 3.5–5.1)
Sodium: 132 mmol/L — ABNORMAL LOW (ref 135–145)

## 2023-02-25 LAB — CBC
HCT: 40.8 % (ref 36.0–46.0)
Hemoglobin: 14 g/dL (ref 12.0–15.0)
MCH: 28.9 pg (ref 26.0–34.0)
MCHC: 34.3 g/dL (ref 30.0–36.0)
MCV: 84.3 fL (ref 80.0–100.0)
Platelets: 239 10*3/uL (ref 150–400)
RBC: 4.84 MIL/uL (ref 3.87–5.11)
RDW: 12.1 % (ref 11.5–15.5)
WBC: 7 10*3/uL (ref 4.0–10.5)
nRBC: 0 % (ref 0.0–0.2)

## 2023-02-25 LAB — HEMOGLOBIN A1C
Hgb A1c MFr Bld: 9.3 % — ABNORMAL HIGH (ref 4.8–5.6)
Mean Plasma Glucose: 220.21 mg/dL

## 2023-02-25 MED ORDER — POTASSIUM CHLORIDE CRYS ER 10 MEQ PO TBCR: 10.0000 meq | EXTENDED_RELEASE_TABLET | Freq: Two times a day (BID) | ORAL | 0 refills | Status: DC

## 2023-02-25 NOTE — Progress Notes (Signed)
Patient CBG 414 Potassium 3.3  Dr Dallas Schimke and Dr Deatra Canter  aware. Potassium was called in to her pharmacy and she will pick it up tonight.  She will take her full insulin dose the night before the procedure instead of half as previously instructed.

## 2023-02-28 ENCOUNTER — Ambulatory Visit (HOSPITAL_COMMUNITY): Payer: BC Managed Care – PPO

## 2023-02-28 ENCOUNTER — Encounter (HOSPITAL_COMMUNITY)
Admission: RE | Disposition: A | Discharge status: Home / Self Care | Payer: Self-pay | Source: Home / Self Care | Attending: Orthopedic Surgery

## 2023-02-28 ENCOUNTER — Encounter (HOSPITAL_COMMUNITY): Payer: Self-pay | Admitting: Orthopedic Surgery

## 2023-02-28 ENCOUNTER — Ambulatory Visit (HOSPITAL_COMMUNITY): Payer: BC Managed Care – PPO | Admitting: Anesthesiology

## 2023-02-28 ENCOUNTER — Ambulatory Visit (HOSPITAL_COMMUNITY)
Admission: RE | Admit: 2023-02-28 | Discharge: 2023-02-28 | Disposition: A | Discharge status: Home / Self Care | Payer: BC Managed Care – PPO | Attending: Orthopedic Surgery | Admitting: Orthopedic Surgery

## 2023-02-28 ENCOUNTER — Other Ambulatory Visit: Payer: Self-pay

## 2023-02-28 DIAGNOSIS — K219 Gastro-esophageal reflux disease without esophagitis: Secondary | ICD-10-CM | POA: Insufficient documentation

## 2023-02-28 DIAGNOSIS — S82841A Displaced bimalleolar fracture of right lower leg, initial encounter for closed fracture: Secondary | ICD-10-CM | POA: Diagnosis not present

## 2023-02-28 DIAGNOSIS — E1065 Type 1 diabetes mellitus with hyperglycemia: Secondary | ICD-10-CM | POA: Insufficient documentation

## 2023-02-28 DIAGNOSIS — S82891A Other fracture of right lower leg, initial encounter for closed fracture: Secondary | ICD-10-CM

## 2023-02-28 DIAGNOSIS — Z794 Long term (current) use of insulin: Secondary | ICD-10-CM | POA: Insufficient documentation

## 2023-02-28 DIAGNOSIS — W1843XA Slipping, tripping and stumbling without falling due to stepping from one level to another, initial encounter: Secondary | ICD-10-CM | POA: Insufficient documentation

## 2023-02-28 DIAGNOSIS — Z79899 Other long term (current) drug therapy: Secondary | ICD-10-CM | POA: Insufficient documentation

## 2023-02-28 DIAGNOSIS — Z791 Long term (current) use of non-steroidal anti-inflammatories (NSAID): Secondary | ICD-10-CM | POA: Insufficient documentation

## 2023-02-28 DIAGNOSIS — F17210 Nicotine dependence, cigarettes, uncomplicated: Secondary | ICD-10-CM | POA: Insufficient documentation

## 2023-02-28 DIAGNOSIS — I10 Essential (primary) hypertension: Secondary | ICD-10-CM | POA: Insufficient documentation

## 2023-02-28 DIAGNOSIS — Y9301 Activity, walking, marching and hiking: Secondary | ICD-10-CM | POA: Insufficient documentation

## 2023-02-28 DIAGNOSIS — J45909 Unspecified asthma, uncomplicated: Secondary | ICD-10-CM | POA: Insufficient documentation

## 2023-02-28 HISTORY — PX: ORIF ANKLE FRACTURE: SHX5408

## 2023-02-28 LAB — GLUCOSE, CAPILLARY
Glucose-Capillary: 352 mg/dL — ABNORMAL HIGH (ref 70–99)
Glucose-Capillary: 316 mg/dL — ABNORMAL HIGH (ref 70–99)
Glucose-Capillary: 234 mg/dL — ABNORMAL HIGH (ref 70–99)

## 2023-02-28 SURGERY — OPEN REDUCTION INTERNAL FIXATION (ORIF) ANKLE FRACTURE
Anesthesia: General | Site: Ankle | Laterality: Right

## 2023-02-28 MED ORDER — PROPOFOL 10 MG/ML IV BOLUS
INTRAVENOUS | Status: AC
  Filled 2023-02-28: qty 20

## 2023-02-28 MED ORDER — PROPOFOL 10 MG/ML IV BOLUS
INTRAVENOUS | Status: DC | PRN
  Administered 2023-02-28: 180 mg via INTRAVENOUS
  Administered 2023-02-28: 20 mg via INTRAVENOUS

## 2023-02-28 MED ORDER — CHLORHEXIDINE GLUCONATE 0.12 % MT SOLN
15.0000 mL | Freq: Once | OROMUCOSAL | Status: AC
  Administered 2023-02-28: 15 mL via OROMUCOSAL

## 2023-02-28 MED ORDER — ONDANSETRON HCL 4 MG/2ML IJ SOLN
INTRAMUSCULAR | Status: AC
  Filled 2023-02-28: qty 2

## 2023-02-28 MED ORDER — LIDOCAINE HCL (PF) 1 % IJ SOLN
INTRAMUSCULAR | Status: DC | PRN
  Administered 2023-02-28 (×2): 3 mL

## 2023-02-28 MED ORDER — FENTANYL CITRATE PF 50 MCG/ML IJ SOSY: 50.0000 ug | PREFILLED_SYRINGE | Freq: Once | INTRAMUSCULAR | Status: AC

## 2023-02-28 MED ORDER — BUPIVACAINE HCL (PF) 0.5 % IJ SOLN
INTRAMUSCULAR | Status: AC
  Filled 2023-02-28: qty 30

## 2023-02-28 MED ORDER — OXYCODONE HCL 5 MG PO TABS: 5.0000 mg | ORAL_TABLET | ORAL | 0 refills | Status: AC | PRN

## 2023-02-28 MED ORDER — FENTANYL CITRATE (PF) 100 MCG/2ML IJ SOLN
INTRAMUSCULAR | Status: DC | PRN
  Administered 2023-02-28: 100 ug via INTRAVENOUS
  Administered 2023-02-28 (×2): 50 ug via INTRAVENOUS

## 2023-02-28 MED ORDER — ROCURONIUM BROMIDE 10 MG/ML (PF) SYRINGE
PREFILLED_SYRINGE | INTRAVENOUS | Status: AC
  Filled 2023-02-28: qty 10

## 2023-02-28 MED ORDER — CEFAZOLIN SODIUM-DEXTROSE 2-4 GM/100ML-% IV SOLN
2.0000 g | INTRAVENOUS | Status: AC
  Administered 2023-02-28: 2 g via INTRAVENOUS

## 2023-02-28 MED ORDER — ONDANSETRON HCL 4 MG/2ML IJ SOLN
INTRAMUSCULAR | Status: DC | PRN
  Administered 2023-02-28: 4 mg via INTRAVENOUS

## 2023-02-28 MED ORDER — KETOROLAC TROMETHAMINE 30 MG/ML IJ SOLN
INTRAMUSCULAR | Status: DC | PRN
  Administered 2023-02-28: 30 mg via INTRAVENOUS

## 2023-02-28 MED ORDER — CHLORHEXIDINE GLUCONATE 0.12 % MT SOLN
OROMUCOSAL | Status: AC
  Filled 2023-02-28: qty 75

## 2023-02-28 MED ORDER — ONDANSETRON HCL 4 MG PO TABS: 4.0000 mg | ORAL_TABLET | Freq: Three times a day (TID) | ORAL | 0 refills | Status: AC | PRN

## 2023-02-28 MED ORDER — 0.9 % SODIUM CHLORIDE (POUR BTL) OPTIME
TOPICAL | Status: DC | PRN
  Administered 2023-02-28: 1000 mL

## 2023-02-28 MED ORDER — ASPIRIN 81 MG PO TBEC: 81.0000 mg | DELAYED_RELEASE_TABLET | Freq: Two times a day (BID) | ORAL | 0 refills | Status: AC

## 2023-02-28 MED ORDER — BUPIVACAINE HCL (PF) 0.25 % IJ SOLN
INTRAMUSCULAR | Status: DC | PRN
  Administered 2023-02-28: 30 mL via PERINEURAL
  Administered 2023-02-28: 20 mL via PERINEURAL

## 2023-02-28 MED ORDER — SUGAMMADEX SODIUM 200 MG/2ML IV SOLN
INTRAVENOUS | Status: DC | PRN
  Administered 2023-02-28: 200 mg via INTRAVENOUS

## 2023-02-28 MED ORDER — MEPERIDINE HCL 50 MG/ML IJ SOLN: 6.2500 mg | INTRAMUSCULAR | Status: DC | PRN

## 2023-02-28 MED ORDER — ROCURONIUM BROMIDE 10 MG/ML (PF) SYRINGE
PREFILLED_SYRINGE | INTRAVENOUS | Status: DC | PRN
  Administered 2023-02-28: 70 mg via INTRAVENOUS

## 2023-02-28 MED ORDER — FENTANYL CITRATE (PF) 250 MCG/5ML IJ SOLN
INTRAMUSCULAR | Status: AC
  Filled 2023-02-28: qty 5

## 2023-02-28 MED ORDER — CEFAZOLIN SODIUM-DEXTROSE 2-4 GM/100ML-% IV SOLN
INTRAVENOUS | Status: AC
  Filled 2023-02-28: qty 100

## 2023-02-28 MED ORDER — LIDOCAINE HCL (CARDIAC) PF 100 MG/5ML IV SOSY
PREFILLED_SYRINGE | INTRAVENOUS | Status: DC | PRN
  Administered 2023-02-28: 40 mg via INTRATRACHEAL

## 2023-02-28 MED ORDER — MIDAZOLAM HCL 2 MG/2ML IJ SOLN
2.0000 mg | Freq: Once | INTRAMUSCULAR | Status: AC
  Administered 2023-02-28: 2 mg via INTRAVENOUS
  Filled 2023-02-28: qty 2

## 2023-02-28 MED ORDER — ORAL CARE MOUTH RINSE: 15.0000 mL | Freq: Once | OROMUCOSAL | Status: AC

## 2023-02-28 MED ORDER — MIDAZOLAM HCL 2 MG/2ML IJ SOLN
INTRAMUSCULAR | Status: AC
  Filled 2023-02-28: qty 2

## 2023-02-28 MED ORDER — LIDOCAINE HCL (PF) 2 % IJ SOLN
INTRAMUSCULAR | Status: AC
  Filled 2023-02-28: qty 5

## 2023-02-28 MED ORDER — HYDROMORPHONE HCL 1 MG/ML IJ SOLN: 0.2500 mg | INTRAMUSCULAR | Status: DC | PRN

## 2023-02-28 MED ORDER — FENTANYL CITRATE (PF) 100 MCG/2ML IJ SOLN
INTRAMUSCULAR | Status: AC
  Administered 2023-02-28: 50 ug
  Filled 2023-02-28: qty 2

## 2023-02-28 MED ORDER — LIDOCAINE HCL (PF) 1 % IJ SOLN
INTRAMUSCULAR | Status: AC
  Filled 2023-02-28: qty 30

## 2023-02-28 MED ORDER — BUPIVACAINE HCL (PF) 0.25 % IJ SOLN
INTRAMUSCULAR | Status: AC
  Filled 2023-02-28: qty 60

## 2023-02-28 MED ORDER — KETOROLAC TROMETHAMINE 30 MG/ML IJ SOLN
INTRAMUSCULAR | Status: AC
  Filled 2023-02-28: qty 1

## 2023-02-28 MED ORDER — LACTATED RINGERS IV SOLN: INTRAVENOUS | Status: DC

## 2023-02-28 MED ORDER — ONDANSETRON HCL 4 MG/2ML IJ SOLN: 4.0000 mg | Freq: Once | INTRAMUSCULAR | Status: DC | PRN

## 2023-02-28 MED ORDER — INSULIN ASPART 100 UNIT/ML IJ SOLN
10.0000 [IU] | Freq: Once | INTRAMUSCULAR | Status: AC
  Administered 2023-02-28: 10 [IU] via SUBCUTANEOUS
  Filled 2023-02-28: qty 0.1

## 2023-02-28 SURGICAL SUPPLY — 65 items
APL PRP STRL LF DISP 70% ISPRP (MISCELLANEOUS) ×2
BANDAGE ESMARK 4X12 BL STRL LF (DISPOSABLE) ×1 IMPLANT
BIT DRILL 2 CANN GRADUATED (BIT) IMPLANT
BIT DRILL 2.5 CANN LNG (BIT) IMPLANT
BIT DRILL 2.7 (BIT) ×1
BIT DRILL 2.7X2.7/3XSCR ANKL (BIT) IMPLANT
BIT DRL 2.7X2.7/3XSCR ANKL (BIT) ×1
BLADE SURG 15 STRL LF DISP TIS (BLADE) ×1 IMPLANT
BLADE SURG 15 STRL SS (BLADE) ×1
BNDG CMPR 12X4 ELC STRL LF (DISPOSABLE) ×1
BNDG CMPR 5X4 CHSV STRCH STRL (GAUZE/BANDAGES/DRESSINGS) ×1
BNDG CMPR STD VLCR NS LF 5.8X4 (GAUZE/BANDAGES/DRESSINGS) ×1
BNDG CMPR STD VLCR NS LF 5.8X6 (GAUZE/BANDAGES/DRESSINGS) ×1
BNDG COHESIVE 4X5 TAN STRL LF (GAUZE/BANDAGES/DRESSINGS) IMPLANT
BNDG ELASTIC 4X5.8 VLCR NS LF (GAUZE/BANDAGES/DRESSINGS) ×1 IMPLANT
BNDG ELASTIC 6X5.8 VLCR NS LF (GAUZE/BANDAGES/DRESSINGS) IMPLANT
BNDG ESMARK 4X12 BLUE STRL LF (DISPOSABLE) ×1
CHLORAPREP W/TINT 26 (MISCELLANEOUS) ×1 IMPLANT
CLOTH BEACON ORANGE TIMEOUT ST (SAFETY) ×1 IMPLANT
COVER LIGHT HANDLE STERIS (MISCELLANEOUS) ×2 IMPLANT
CUFF TOURN SGL QUICK 34 (TOURNIQUET CUFF) ×1
CUFF TRNQT CYL 34X4.125X (TOURNIQUET CUFF) ×1 IMPLANT
DRAPE C-ARM FOLDED MOBILE STRL (DRAPES) ×1 IMPLANT
DRAPE C-ARMOR (DRAPES) ×1 IMPLANT
ELECT REM PT RETURN 9FT ADLT (ELECTROSURGICAL) ×1
ELECTRODE REM PT RTRN 9FT ADLT (ELECTROSURGICAL) ×1 IMPLANT
GAUZE SPONGE 4X4 12PLY STRL (GAUZE/BANDAGES/DRESSINGS) ×1 IMPLANT
GLOVE BIO SURGEON STRL SZ8 (GLOVE) ×2 IMPLANT
GLOVE BIOGEL PI IND STRL 7.0 (GLOVE) ×2 IMPLANT
GLOVE SRG 8 PF TXTR STRL LF DI (GLOVE) ×1 IMPLANT
GLOVE SURG UNDER POLY LF SZ8 (GLOVE) ×1
GOWN STRL REUS W/ TWL XL LVL3 (GOWN DISPOSABLE) ×1 IMPLANT
GOWN STRL REUS W/TWL LRG LVL3 (GOWN DISPOSABLE) ×2 IMPLANT
GOWN STRL REUS W/TWL XL LVL3 (GOWN DISPOSABLE) ×1
K-WIRE BB-TAK (WIRE) ×2
KIT TURNOVER KIT A (KITS) ×1 IMPLANT
KWIRE BB-TAK (WIRE) IMPLANT
MANIFOLD NEPTUNE II (INSTRUMENTS) ×1 IMPLANT
NDL HYPO 21X1.5 SAFETY (NEEDLE) IMPLANT
NEEDLE HYPO 21X1.5 SAFETY (NEEDLE) IMPLANT
NS IRRIG 1000ML POUR BTL (IV SOLUTION) ×1 IMPLANT
PACK BASIC LIMB (CUSTOM PROCEDURE TRAY) ×1 IMPLANT
PAD ABD 5X9 TENDERSORB (GAUZE/BANDAGES/DRESSINGS) ×4 IMPLANT
PAD ARMBOARD 7.5X6 YLW CONV (MISCELLANEOUS) ×1 IMPLANT
PAD CAST 4YDX4 CTTN HI CHSV (CAST SUPPLIES) IMPLANT
PADDING CAST ABS COTTON 4X4 ST (CAST SUPPLIES) ×2 IMPLANT
PADDING CAST COTTON 4X4 STRL (CAST SUPPLIES) ×2
PLATE DISTAL 4 HOLE (Plate) IMPLANT
SCREW COMP KREULOCK 2.7X12 (Screw) IMPLANT
SCREW COMP KREULOCK 2.7X14 (Screw) IMPLANT
SCREW CORTICAL 2.7X18 LO PRO (Screw) IMPLANT
SCREW LO PRO 2.7X22MM CORTEX (Screw) IMPLANT
SCREW LOW PROFILE 3.5X14 (Screw) IMPLANT
SCREW NON-LOCKING 3.5X12MM (Screw) IMPLANT
SET BASIN LINEN APH (SET/KITS/TRAYS/PACK) ×1 IMPLANT
SPLINT PLASTER CAST XFAST 5X30 (CAST SUPPLIES) IMPLANT
SPONGE T-LAP 18X18 ~~LOC~~+RFID (SPONGE) ×1 IMPLANT
STRIP CLOSURE SKIN 1/2X4 (GAUZE/BANDAGES/DRESSINGS) IMPLANT
SUT ETHILON 3 0 FSL (SUTURE) ×1 IMPLANT
SUT MNCRL AB 4-0 PS2 18 (SUTURE) IMPLANT
SUT MON AB 2-0 CT1 36 (SUTURE) IMPLANT
SUT VIC AB 2-0 CT1 27 (SUTURE) ×1
SUT VIC AB 2-0 CT1 TAPERPNT 27 (SUTURE) ×1 IMPLANT
SYR 30ML LL (SYRINGE) IMPLANT
SYR BULB IRRIG 60ML STRL (SYRINGE) ×1 IMPLANT

## 2023-02-28 NOTE — Discharge Instructions (Signed)
Marcellis Frampton A. Dallas Schimke, MD MS [facility] [address] [place],  [place]  [address] Phone: [phone] Fax: [phone]   POST-OPERATIVE INSTRUCTIONS - LOWER EXTREMITY   WOUND CARE Please keep splint clean dry and intact until followup.  You may shower on Post-Op Day #2.  You must keep splint dry during this process and may find that a plastic bag taped around the leg or alternatively a towel based bath may be a better option.   If you get your splint wet or if it is damaged please contact our clinic.  EXERCISES Due to your splint being in place you will not be able to bear weight through your extremity.   DO NOT PUT ANY WEIGHT ON YOUR OPERATIVE LEG Please use crutches or a walker to avoid weight bearing.   REGIONAL ANESTHESIA (NERVE BLOCKS) The anesthesia team may have performed a nerve block for you if safe in the setting of your care.  This is a great tool used to minimize pain.  Typically the block may start wearing off overnight but the long acting medicine may last for 3-4 days.  The nerve block wearing off can be a challenging period but please utilize your as needed pain medications to try and manage this period.    POST-OP MEDICATIONS- Multimodal approach to pain control  In general your pain will be controlled with a combination of substances.  Prescriptions unless otherwise discussed are electronically sent to your pharmacy.  This is a carefully made plan we use to minimize narcotic use.     - Continue Naproxen - Anti-inflammatory medication taken on a scheduled basis.  Continue your current prescription.   - Acetaminophen - Non-narcotic pain medicine taken on a scheduled basis.  Continue your current prescription.   - Oxycodone - This is a strong narcotic, to be used only on an "as needed" basis for pain.  -  Aspirin 81mg  - This medicine is used to minimize the risk of blood clots after surgery.             -          Zofran - take as needed for  nausea   FOLLOW-UP If you develop a Fever (>101.5), Redness or Drainage from the surgical incision site, please call our office to arrange for an evaluation. Please call the office to schedule a follow-up appointment for your incision check if you do not already have one, 10-14 days post-operatively.  IF YOU HAVE ANY QUESTIONS, PLEASE FEEL FREE TO CALL OUR OFFICE.  HELPFUL INFORMATION  If you had a block, it will wear off between 8-24 hrs postop typically.  This is period when your pain may go from nearly zero to the pain you would have had postop without the block.  This is an abrupt transition but nothing dangerous is happening.  You may take an extra dose of narcotic when this happens.  You should wean off your narcotic medicines as soon as you are able.  Most patients will be off or using minimal narcotics before their first postop appointment.   Elevating your leg will help with swelling and pain control.  You are encouraged to elevate your leg as much as possible in the first couple of weeks following surgery.  Imagine a drop of water on your toe, and your goal is to get that water back to your heart.  We suggest you use the pain medication the first night prior to going to bed, in  order to ease any pain when the anesthesia wears off. You should avoid taking pain medications on an empty stomach as it will make you nauseous.  Do not drink alcoholic beverages or take illicit drugs when taking pain medications.  In most states it is against the law to drive while you are in a splint or sling.  And certainly against the law to drive while taking narcotics.  You may return to work/school in the next couple of days when you feel up to it.   Pain medication may make you constipated.  Below are a few solutions to try in this order: Decrease the amount of pain medication if you aren't having pain. Drink lots of decaffeinated fluids. Drink prune juice and/or each dried prunes  If the first 3  don't work start with additional solutions Take Colace - an over-the-counter stool softener Take Senokot - an over-the-counter laxative Take Miralax - a stronger over-the-counter laxative

## 2023-02-28 NOTE — Anesthesia Procedure Notes (Addendum)
Anesthesia Regional Block: Adductor canal block   Pre-Anesthetic Checklist: , timeout performed,  Correct Patient, Correct Site, Correct Laterality,  Correct Procedure, Correct Position, site marked,  Risks and benefits discussed,  Surgical consent,  Pre-op evaluation,  At surgeon's request and post-op pain management  Laterality: Right  Prep: chloraprep       Needles:  Injection technique: Single-shot  Needle Type: Echogenic Stimulator Needle     Needle Length: 10cm  Needle Gauge: 20   Needle insertion depth: 7 cm   Additional Needles:   Procedures:,,,, ultrasound used (permanent image in chart),,    Narrative:  Start time: 02/28/2023 7:16 AM End time: 02/28/2023 7:24 AM Injection made incrementally with aspirations every 5 mL.  Performed by: Personally  Anesthesiologist: Molli Barrows, MD  Additional Notes: BP cuff, EKG monitors applied. Sedation begun. Femoral artery palpated for location of nerve. After nerve location anesthetic injected incrementally, slowly , and after neg aspirations. Tolerated well.

## 2023-02-28 NOTE — Anesthesia Postprocedure Evaluation (Signed)
Anesthesia Post Note  Patient: Kanya A Fosnaugh  Procedure(s) Performed: OPEN REDUCTION INTERNAL FIXATION (ORIF) ANKLE FRACTURE (Right: Ankle)  Patient location during evaluation: Phase II Anesthesia Type: General Level of consciousness: awake and alert and oriented Pain management: pain level controlled Vital Signs Assessment: post-procedure vital signs reviewed and stable Respiratory status: spontaneous breathing, nonlabored ventilation and respiratory function stable Cardiovascular status: blood pressure returned to baseline and stable Postop Assessment: no apparent nausea or vomiting Anesthetic complications: no  No notable events documented.   Last Vitals:  Vitals:   02/28/23 0952 02/28/23 1008  BP: (!) 146/83   Pulse: (!) 58   Resp: 19   Temp: 36.7 C 36.7 C  SpO2: 98%     Last Pain:  Vitals:   02/28/23 1008  TempSrc: Oral  PainSc: 0-No pain                 Jolie Strohecker C Lauralie Blacksher

## 2023-02-28 NOTE — Interval H&P Note (Signed)
History and Physical Interval Note:  02/28/2023 7:11 AM  [name]  has presented today for surgery, with the diagnosis of Right ankle fracture.  The various methods of treatment have been discussed with the patient and family. After consideration of risks, benefits and other options for treatment, the patient has consented to  Procedure(s): OPEN REDUCTION INTERNAL FIXATION (ORIF) ANKLE FRACTURE (Right) as a surgical intervention.  The patient's history has been reviewed, patient examined, no change in status, stable for surgery.  I have reviewed the patient's chart and labs.  Questions were answered to the patient's satisfaction.    She has been bearing weight in her splint.  Stressed the importance of non weight bearing following surgery.  We can provide a prescription for a walker and other assistive device if having issues with crutches.  She is at high risk for loss of fixation and poor healing because she is a poorly controlled diabetic.  This could require multiple surgeries and even amputation.  She states her understanding.      Oliver Barre

## 2023-02-28 NOTE — Transfer of Care (Signed)
Immediate Anesthesia Transfer of Care Note  Patient: [name]  Procedure(s) Performed: OPEN REDUCTION INTERNAL FIXATION (ORIF) ANKLE FRACTURE (Right: Ankle)  Patient Location: PACU  Anesthesia Type:General  Level of Consciousness: drowsy  Airway & Oxygen Therapy: Patient Spontanous Breathing and Patient connected to nasal cannula oxygen  Post-op Assessment: Report given to RN and Post -op Vital signs reviewed and stable  Post vital signs: Reviewed and stable  Last Vitals:  Vitals Value Taken Time  BP 127/78   Temp 98.1   Pulse 57 02/28/23 0911  Resp 19 02/28/23 0911  SpO2 94 % 02/28/23 0911  Vitals shown include unvalidated device data.  Last Pain:  Vitals:   02/28/23 0658  TempSrc: Oral  PainSc: 8       Patients Stated Pain Goal: 4 (02/28/23 6578)  Complications: No notable events documented.

## 2023-02-28 NOTE — Op Note (Signed)
Orthopaedic Surgery Operative Note (CSN: [id number])  [name]  1971-09-09 Date of Surgery: 02/28/2023   Diagnoses:  Right ankle fracture; functional bimalleolar equivalent  Procedure: ORIF of right functional bimalleolar ankle fracture with a lateral based plate and screw construct   Operative Finding Successful completion of the planned procedure.  ORIF of Right lateral malleolus with plate and screw construct, and a single lag screw.  Final imaging showed the mortise to be congruent and no syndesmotic disruption.  No further fixation was warranted.    Post-Op Diagnosis: Same Surgeons:Primary: Oliver Barre, MD Assistants: Cecile Sheerer Location: [facility] OR ROOM 2 Anesthesia: General with regional anesthesia Antibiotics: Ancef 2 g Tourniquet time: 46 minutes Estimated Blood Loss: 10 cc Complications: None Specimens: None  Implants: Implant Name Type Inv. Item Serial No. Manufacturer Lot No. LRB No. Used Action  SCREW LO PRO 2.7X22MM CORTEX - [id number] Screw SCREW LO PRO 2.7X22MM CORTEX  [employer] STERILE ON SET Right 1 Implanted  SCREW NON-LOCKING 3.5X12MM - [id number] Screw SCREW NON-LOCKING 3.5X12MM  [employer] STERILE ON SET Right 1 Implanted  SCREW LOW PROFILE 3.5X14 - [id number] Screw SCREW LOW PROFILE 3.5X14  [employer] STERILE ON SET Right 1 Implanted  SCREW CORTICAL 2.7X18 LO PRO - [id number] Screw SCREW CORTICAL 2.7X18 LO PRO  [employer] STERILE ON SET Right 1 Implanted  SCREW COMP KREULOCK 2.7X14 - [id number] Screw SCREW COMP KREULOCK 2.7X14  [employer] STERILE ON SET Right 1 Implanted    Indications for Surgery:   [name] is a 52 y.o. female who sustained a Right bimalleolar ankle fracture.  This represents an unstable injury and I recommended operative fixation.  Benefits and risks of operative and nonoperative management were discussed prior to surgery with the patient and informed consent was completed.  Specific risks including infection, need for  additional surgery, bleeding, nonunion, malunion, damage to surrounding structures including blood vessels and nerves, blood clots, blood clots in the lungs, persistent pain, stiffness and more severe complications associated with anesthesia were discussed.  All questions were answered.  Surgical consent was finalized.    Procedure:   The patient was identified properly. Informed consent was obtained and the surgical site was marked. The patient was taken to the OR where general anesthesia was induced.  The patient was positioned supine with the operative leg on bone foam, and a bump under the hip.  The right leg was prepped and draped in the usual sterile fashion.  Timeout was performed before the beginning of the case.  Tourniquet was used for the above duration.  We started on the lateral side of the ankle.  We used fluoroscopy to confirm the level of the fracture.  We made a long contusional incision in line with the distal fibula, extending across the fracture site to the tip of the fibula.  We incised sharply through skin.  As we continued with our approach, we palpated the fibula to ensure that the trajectory was in line with the fibula.  We continued to dissect to the lateral aspect of the fibula.  At the proximal extent of the incision, we were careful not to inadvertently damaged the crossing peroneal nerve.  The fracture site was identified, and fracture hematoma was encountered.  This was irrigated.  We continued to expose the lateral aspect of the distal fibula.  We used a key elevator to expose proximally and distally.  Once we had sufficiently dissected down to the level of the fibula, we turned our  attention to the fracture.  The fracture was distracted, and we removed callus with a combination of curettes, rongeurs and irrigation.  Once the callus had been sufficiently removed, we used a point-to-point reduction clamp and manipulated the ankle in order to reduce the fracture.  This was  confirmed under fluoroscopy.  Based on the orientation of the fracture, I made the decision at this point to proceed with a lag screw.  Fluoroscopy confirmed our reduction.  We then proceeded to place a lag screw by technique in retrograde fashion, orthogonal to the fracture.  We achieved excellent purchase with a lag screw.  The clamp was removed, and fluoroscopy confirmed that we had appropriate reduction, without loss of our reduction.  Next, we selected an appropriate sized distal fibula plate from the back table.  This was provisionally placed on the lateral aspect of the fibula.  The placement, and the length was confirmed under fluoroscopy.  We then provisionally secured the plate to the distal fibula with BB tacks.  Once again, we confirmed our placement and positioning of the plate with orthogonal views under fluoroscopy.  We proceeded to place a single nonlocking screw in the shaft, in order to secure the plate to bone, and aid in the overall reduction.  We then proceeded to place a nonlocking screw in the distal cluster.  Fluoroscopy confirmed placement of the screws, as well as the overall length.  We had an excellent reduction.  We then placed multiple locking screws in the distal cluster.  Finally, we returned to the shaft of the plate, and placed multiple nonlocking screws in order to complete our construct.  We then stressed the joint, and the mortise was congruent.  There is no medial clear space widening.  There is no syndesmotic disruption.  Final fluoroscopic images demonstrated an excellent reduction, without instability.  Plates and screws were appropriately positioned.  After stressing the joint, it was determined that no additional fixation was necessary.  We irrigated the wound copiously.  We closed the incision in a multilayer fashion with absorbable suture.  Sterile dressing was placed followed by a well padded splint.  Patient was awoken taken to PACU in stable  condition.   Post-operative plan:  The patient will be NWB on the operative extremity Splint is to remain clean, dry and intact Discharge home from the PACU once they have recovered DVT prophylaxis Aspirin 81 mg twice daily for 6 weeks.    Pain control with PRN pain medication preferring oral medicines.   Follow up plan will be scheduled in approximately 10-14 days for incision check and XR of the operative ankle.

## 2023-02-28 NOTE — Anesthesia Procedure Notes (Signed)
Procedure Name: Intubation Date/Time: 02/28/2023 7:45 AM  Performed by: Lorin Glass, CRNAPre-anesthesia Checklist: Patient identified, Emergency Drugs available, Suction available and Patient being monitored Patient Re-evaluated:Patient Re-evaluated prior to induction Oxygen Delivery Method: Circle system utilized Preoxygenation: Pre-oxygenation with 100% oxygen Induction Type: IV induction Ventilation: Mask ventilation without difficulty Laryngoscope Size: Mac and 3 Grade View: Grade II Tube type: Oral Tube size: 7.0 mm Number of attempts: 1 Airway Equipment and Method: Stylet Placement Confirmation: ETT inserted through vocal cords under direct vision, positive ETCO2 and breath sounds checked- equal and bilateral Secured at: 21 cm Tube secured with: Tape Dental Injury: Teeth and Oropharynx as per pre-operative assessment

## 2023-02-28 NOTE — Anesthesia Preprocedure Evaluation (Addendum)
Anesthesia Evaluation  Patient identified by MRN, date of birth, ID band Patient awake    Reviewed: Allergy & Precautions, H&P , NPO status , Patient's Chart, lab work & pertinent test results, reviewed documented beta blocker date and time   Airway Mallampati: II  TM Distance: >3 FB Neck ROM: Full    Dental no notable dental hx. (+) Teeth Intact, Dental Advisory Given   Pulmonary asthma , Current Smoker and Patient abstained from smoking.   Pulmonary exam normal breath sounds clear to auscultation       Cardiovascular Exercise Tolerance: Good hypertension, Pt. on home beta blockers and Pt. on medications Normal cardiovascular exam Rhythm:Regular Rate:Normal  16-Feb-2023 10:01:22 [facility] ROUTINE RECORD 1970/10/03 (52 yr) Female Black Vent. rate 79 BPM PR interval 161 ms QRS duration 86 ms QT/QTcB 398/457 ms P-R-T axes 55 55 19 Sinus rhythm Probable anteroseptal infarct, old Confirmed by Alvino Blood ([id number]) on 02/17/2023 11:36:54 AM   Neuro/Psych  PSYCHIATRIC DISORDERS Anxiety     negative neurological ROS     GI/Hepatic Neg liver ROS,GERD  Poorly Controlled,,  Endo/Other  diabetes, Poorly Controlled, Type 1, Insulin Dependent    Renal/GU Renal InsufficiencyRenal disease  negative genitourinary   Musculoskeletal negative musculoskeletal ROS (+)    Abdominal   Peds negative pediatric ROS (+)  Hematology  (+) Blood dyscrasia, anemia   Anesthesia Other Findings   Reproductive/Obstetrics negative OB ROS                             Anesthesia Physical Anesthesia Plan  ASA: 3  Anesthesia Plan: General   Post-op Pain Management: Regional block* and Dilaudid IV   Induction: Intravenous  PONV Risk Score and Plan: 3 and Ondansetron, Midazolam and Metaclopromide  Airway Management Planned: Oral ETT  Additional Equipment:   Intra-op Plan:    Post-operative Plan: Extubation in OR  Informed Consent: I have reviewed the patients History and Physical, chart, labs and discussed the procedure including the risks, benefits and alternatives for the proposed anesthesia with the patient or authorized representative who has indicated his/her understanding and acceptance.     Dental advisory given  Plan Discussed with: CRNA and Surgeon  Anesthesia Plan Comments:         Anesthesia Quick Evaluation

## 2023-02-28 NOTE — Anesthesia Procedure Notes (Addendum)
Anesthesia Regional Block: Popliteal block   Pre-Anesthetic Checklist: , timeout performed,  Correct Patient, Correct Site, Correct Laterality,  Correct Procedure, Correct Position, site marked,  Risks and benefits discussed,  At surgeon's request and post-op pain management  Laterality: Right and Upper  Prep: chloraprep       Needles:  Injection technique: Single-shot  Needle Type: Echogenic Stimulator Needle     Needle Length: 10cm  Needle Gauge: 20   Needle insertion depth: 6 cm   Additional Needles:   Procedures:,,,, ultrasound used (permanent image in chart),,    Narrative:  Start time: 02/28/2023 7:10 AM End time: 02/28/2023 7:15 AM  Performed by: Personally  Anesthesiologist: Molli Barrows, MD  Additional Notes: BP cuff, EKG monitors applied. Sedation begun. After nerve location anesthetic injected incrementally, slowly , and after neg aspirations. Tolerated well.

## 2023-03-06 ENCOUNTER — Encounter (HOSPITAL_COMMUNITY): Payer: Self-pay | Admitting: Orthopedic Surgery

## 2023-03-15 ENCOUNTER — Other Ambulatory Visit (INDEPENDENT_AMBULATORY_CARE_PROVIDER_SITE_OTHER): Payer: BC Managed Care – PPO

## 2023-03-15 ENCOUNTER — Ambulatory Visit (INDEPENDENT_AMBULATORY_CARE_PROVIDER_SITE_OTHER): Payer: BC Managed Care – PPO | Admitting: Orthopedic Surgery

## 2023-03-15 ENCOUNTER — Encounter: Payer: Self-pay | Admitting: Orthopedic Surgery

## 2023-03-15 DIAGNOSIS — S82841A Displaced bimalleolar fracture of right lower leg, initial encounter for closed fracture: Secondary | ICD-10-CM

## 2023-03-15 MED ORDER — TRAMADOL HCL 50 MG PO TABS: 50.0000 mg | ORAL_TABLET | Freq: Two times a day (BID) | ORAL | 0 refills | Status: DC | PRN

## 2023-03-15 NOTE — Patient Instructions (Signed)

## 2023-03-15 NOTE — Progress Notes (Signed)
Orthopaedic Postop Note  Assessment: [name] is a 52 y.o. female s/p ORIF of Right ankle fracture  DOS: 02/28/2023  Plan: Sutures removed, steri strips placed Well padded short leg cast placed in clinic today NWB on the operative extremity Continue to take Aspirin 81 mg BID Provided a prescription for tramadol Elevate the leg is much as possible to help with swelling and pain control.  Follow-up: Return in about 2 weeks (around 03/29/2023). XR at next visit: Right ankle  Subjective:  Chief Complaint  Patient presents with   Routine Post Op    R ANKLE DOS 02/28/23    History of Present Illness: [name] is a 52 y.o. female who presents following the above stated procedure.  Surgery was approximately 2 weeks ago.  She is doing well.  She is no longer taking oxycodone.  She has been bearing weight on the splint, but otherwise is doing well.  No numbness or tingling.  No fevers or chills.  Review of Systems: No fevers or chills No numbness or tingling No Chest Pain No shortness of breath   Objective: LMP 06/23/2015   Physical Exam:  No acute distress.  Evaluation of the right ankle demonstrates improved swelling.  No blistering.  Incision is healing appropriately, without surrounding erythema or drainage.  No tenderness to palpation.  She tolerates gentle range of motion of the ankle.  Sensation intact to the dorsum of the foot.  IMAGING: I personally ordered and reviewed the following images  X-rays of the right ankle were obtained out of the splint.  No acute injuries are noted.  Minimal soft tissue swelling is appreciated.  Distal fibula fracture remains in good alignment.  No evidence of hardware failure.  The screws are not backing out.  Mortise is congruent.  No syndesmotic disruption.  Impression: Right ankle fracture in stable position following operative fixation, without hardware failure.   Oliver Barre, MD 03/15/2023 9:58 AM

## 2023-03-20 ENCOUNTER — Other Ambulatory Visit: Payer: Self-pay | Admitting: Orthopedic Surgery

## 2023-03-29 ENCOUNTER — Other Ambulatory Visit (INDEPENDENT_AMBULATORY_CARE_PROVIDER_SITE_OTHER): Payer: BC Managed Care – PPO

## 2023-03-29 ENCOUNTER — Encounter: Payer: Self-pay | Admitting: Orthopedic Surgery

## 2023-03-29 ENCOUNTER — Ambulatory Visit (INDEPENDENT_AMBULATORY_CARE_PROVIDER_SITE_OTHER): Payer: BC Managed Care – PPO | Admitting: Orthopedic Surgery

## 2023-03-29 DIAGNOSIS — S82841D Displaced bimalleolar fracture of right lower leg, subsequent encounter for closed fracture with routine healing: Secondary | ICD-10-CM | POA: Diagnosis not present

## 2023-03-29 DIAGNOSIS — S82841A Displaced bimalleolar fracture of right lower leg, initial encounter for closed fracture: Secondary | ICD-10-CM | POA: Diagnosis not present

## 2023-03-29 MED ORDER — TRAMADOL HCL 50 MG PO TABS: 50.0000 mg | ORAL_TABLET | Freq: Two times a day (BID) | ORAL | 0 refills | Status: DC | PRN

## 2023-03-29 NOTE — Addendum Note (Signed)
Addended by: Thane Edu A on: 03/29/2023 10:06 AM   Modules accepted: Orders

## 2023-03-29 NOTE — Progress Notes (Signed)
Orthopaedic Postop Note  Assessment: [name] is a 52 y.o. female s/p ORIF of Right ankle fracture  DOS: 02/28/2023  Plan: Mrs. Volmer is healing well.  Radiographs are stable.  Her incision on the lateral ankle is healing.  Swelling has improved.  However, she does continue to try and bear weight.  This is evident on the cast.  She also noted some rubbing over the anterior aspect of the cast.  I stressed the importance of remaining nonweightbearing, allowing this to heal.  She is a poorly controlled diabetic at this point, so she is at increased risk of failure fixation, and other issues with healing.  Continue taking naproxen.  Can take tramadol as needed.  Elevate the foot.  We will proceed with 1 more cast, and potentially 2 more casts.  I will see her back in 2 weeks.  Cast application - Right short leg cast   Verbal consent was obtained and the correct extremity was identified. A well padded, appropriately molded short leg cast was applied to the Right leg Fingers remained warm and well perfused.   There were no sharp edges Patient tolerated the procedure well Cast care instructions were provided    Follow-up: Return in about 2 weeks (around 04/12/2023). XR at next visit: Right ankle  Subjective:  No chief complaint on file.   History of Present Illness: [name] is a 52 y.o. female who presents following the above stated procedure.  Surgery was approximately 4 weeks ago.  She continues to have some discomfort in the leg.  She notes that the prior cast was tight over the anterior tibia.  She placed some cardboard in there to relieve some of the direct pressure.  She has been taking tramadol, but states this has not been effective.  She has continued to bear weight on the right leg, despite me expressing concerns at the last visit.  No numbness or tingling.  No fevers or chills.   Review of Systems: No fevers or chills No numbness or tingling No Chest Pain No  shortness of breath   Objective: LMP 06/23/2015   Physical Exam:  Alert and oriented.  No acute distress.  Evaluation of the right ankle demonstrates much improved swelling.  No bruising.  Surgical incision the lateral ankle is healing well.  No surrounding erythema or drainage.  She easily gets to a plantigrade position.  She has some stiffness beyond this degree of flexion.  No tenderness palpation medial or lateral.  Sensation is intact over the dorsum of the foot.  Plantar sensation is intact.  IMAGING: I personally ordered and reviewed the following images  X-rays of the right ankle were obtained in clinic today.  These are out of the cast.  No acute injuries.  Fracture remains in good alignment.  Hardware remains unchanged.  No screws are backing out.  Mortise is congruent.  No syndesmotic disruption.  Impression: Healing right ankle fracture without hardware failure.   Oliver Barre, MD 03/29/2023 10:00 AM

## 2023-03-29 NOTE — Patient Instructions (Signed)

## 2023-04-02 ENCOUNTER — Encounter: Payer: Self-pay | Admitting: Orthopedic Surgery

## 2023-04-12 ENCOUNTER — Ambulatory Visit (INDEPENDENT_AMBULATORY_CARE_PROVIDER_SITE_OTHER): Payer: BC Managed Care – PPO | Admitting: Orthopedic Surgery

## 2023-04-12 ENCOUNTER — Encounter: Payer: Self-pay | Admitting: Orthopedic Surgery

## 2023-04-12 ENCOUNTER — Other Ambulatory Visit: Payer: Self-pay

## 2023-04-12 DIAGNOSIS — S82841D Displaced bimalleolar fracture of right lower leg, subsequent encounter for closed fracture with routine healing: Secondary | ICD-10-CM

## 2023-04-12 NOTE — Progress Notes (Signed)
Orthopaedic Postop Note  Assessment: [name] is a 52 y.o. female s/p ORIF of Right ankle fracture  DOS: 02/28/2023  Plan: [name] is healing well.  Radiographs demonstrates good healing.  Incision is healed.  She has no pain.  Will transition her to a walking boot.  I would like her to wear the boot at all times for the next 2 weeks.  After that, she can transition to a regular shoe if tolerated.  She would like to try working on exercises on her own.  I will see her back in 1 month.  Follow-up: Return in about 4 weeks (around 05/10/2023). XR at next visit: Right ankle  Subjective:  Chief Complaint  Patient presents with   right ankle follow up    02/28/2023 ORIF right ankle fracture    History of Present Illness: [name] is a 53 y.o. female who presents following the above stated procedure.  Surgery was approximately 6 weeks ago.  She has occasional pains in the lower leg.  Minimal pain in the ankle.  She is no longer taking pain medications.  No issues with the cast.  She has remained nonweightbearing in the most recent cast.  Review of Systems: No fevers or chills No numbness or tingling No Chest Pain No shortness of breath   Objective: LMP 06/23/2015   Physical Exam:  Alert and oriented.  No acute distress.  Evaluation of the right ankle demonstrates no swelling.  Well-healed lateral based incision.  She has some dry skin and scabbing.  Sensation intact to the dorsum of the foot.  She is just short of a plantigrade position, demonstrates some tightness of the heel cord.  Toes warm well-perfused.  She tolerates gentle range of motion.  No tenderness laterally.  IMAGING: I personally ordered and reviewed the following images  X-rays of the right ankle were obtained in clinic today.  These are compared to prior x-rays.  There has been no interval displacement of the fracture.  The mortise is congruent.  Hardware remains intact.  Screws are not backing  out.  No syndesmotic disruption.  Impression: Healed right ankle fracture without hardware failure.  Marie Barre, MD 04/12/2023 10:55 AM

## 2023-04-12 NOTE — Patient Instructions (Signed)
Wear the boot at all times for the next 2 weeks, with the exception of hygiene and when you are doing your exercises.  After that, he can transition to a regular shoe as tolerated.  Follow-up in 1 month.  Instructions  1.  You have sustained an ankle sprain, or similar exercises that can be treated as an ankle sprain.  **These exercises can also be used as part of recovery from an ankle fracture.  2.  I encourage you to stay on your feet and gradually remove your walking boot.   3.  Below are some exercises that you can complete on your own to improve your symptoms.  4.  As an alternative, you can search for ankle sprain exercises online, and can see some demonstrations on YouTube  5.  If you are having difficulty with these exercises, we can also prescribe formal physical therapy  Ankle Exercises Ask your health care provider which exercises are safe for you. Do exercises exactly as told by your health care provider and adjust them as directed. It is normal to feel mild stretching, pulling, tightness, or mild discomfort as you do these exercises. Stop right away if you feel sudden pain or your pain gets worse. Do not begin these exercises until told by your health care provider.  Stretching and range-of-motion exercises These exercises warm up your muscles and joints and improve the movement and flexibility of your ankle. These exercises may also help to relieve pain.  Dorsiflexion/plantar flexion  Sit with your R knee straight or bent. Do not rest your foot on anything. Flex your left ankle to tilt the top of your foot toward your shin. This is called dorsiflexion. Hold this position for 5 seconds. Point your toes downward to tilt the top of your foot away from your shin. This is called plantar flexion. Hold this position for 5 seconds. Repeat 10 times. Complete this exercise 2-3 times a day.  As tolerated  Ankle alphabet  Sit with your R foot supported at your lower leg. Do not rest  your foot on anything. Make sure your foot has room to move freely. Think of your R foot as a paintbrush: Move your foot to trace each letter of the alphabet in the air. Keep your hip and knee still while you trace the letters. Trace every letter from A to Z. Make the letters as large as you can without causing or increasing any discomfort.  Repeat 2-3 times. Complete this exercise 2-3 times a day.   Strengthening exercises These exercises build strength and endurance in your ankle. Endurance is the ability to use your muscles for a long time, even after they get tired. Dorsiflexors These are muscles that lift your foot up. Secure a rubber exercise band or tube to an object, such as a table leg, that will stay still when the band is pulled. Secure the other end around your R foot. Sit on the floor, facing the object with your R leg extended. The band or tube should be slightly tense when your foot is relaxed. Slowly flex your R ankle and toes to bring your foot toward your shin. Hold this position for 5 seconds. Slowly return your foot to the starting position, controlling the band as you do that. Repeat 10 times. Complete this exercise 2-3 times a day.  Plantar flexors These are muscles that push your foot down. Sit on the floor with your R leg extended. Loop a rubber exercise band or tube around the  ball of your R foot. The ball of your foot is on the walking surface, right under your toes. The band or tube should be slightly tense when your foot is relaxed. Slowly point your toes downward, pushing them away from you. Hold this position for 5 seconds. Slowly release the tension in the band or tube, controlling smoothly until your foot is back in the starting position. Repeat 10 times. Complete this exercise 2-3 times a day.  Towel curls  Sit in a chair on a non-carpeted surface, and put your feet on the floor. Place a towel in front of your feet. Keeping your heel on the floor, put  your R foot on the towel. Pull the towel toward you by grabbing the towel with your toes and curling them under. Keep your heel on the floor. Let your toes relax. Grab the towel again. Keep pulling the towel until it is completely underneath your foot. Repeat 10 times. Complete this exercise 2-3 times a day.  Standing plantar flexion This is an exercise in which you use your toes to lift your body's weight while standing. Stand with your feet shoulder-width apart. Keep your weight spread evenly over the width of your feet while you rise up on your toes. Use a wall or table to steady yourself if needed, but try not to use it for support. If this exercise is too easy, try these options: Shift your weight toward your R leg until you feel challenged. If told by your health care provider, lift your uninjured leg off the floor. Hold this position for 5 seconds. Repeat 10 times. Complete this exercise 2-3 times a day.  Tandem walking Stand with one foot directly in front of the other. Slowly raise your back foot up, lifting your heel before your toes, and place it directly in front of your other foot. Continue to walk in this heel-to-toe way. Have a countertop or wall nearby to use if needed to keep your balance, but try not to hold onto anything for support.  Repeat 10 times. Complete this exercise 2-3 times a day.   Document Revised: 05/31/2018 Document Reviewed: 06/02/2018 Elsevier Patient Education  2020 [employer].

## 2023-04-18 ENCOUNTER — Telehealth: Payer: Self-pay | Admitting: Orthopedic Surgery

## 2023-04-18 ENCOUNTER — Other Ambulatory Visit: Payer: Self-pay | Admitting: Orthopedic Surgery

## 2023-04-18 DIAGNOSIS — S82841D Displaced bimalleolar fracture of right lower leg, subsequent encounter for closed fracture with routine healing: Secondary | ICD-10-CM

## 2023-04-18 MED ORDER — TRAMADOL HCL 50 MG PO TABS
50.0000 mg | ORAL_TABLET | Freq: Four times a day (QID) | ORAL | 0 refills | Status: DC | PRN
Start: 2023-04-18 — End: 2023-04-24

## 2023-04-18 MED ORDER — IBUPROFEN 800 MG PO TABS
800.0000 mg | ORAL_TABLET | Freq: Three times a day (TID) | ORAL | 2 refills | Status: AC | PRN
Start: 2023-04-18 — End: ?

## 2023-04-18 NOTE — Telephone Encounter (Signed)
I ll call in some ibuprofen and tramadol

## 2023-04-18 NOTE — Telephone Encounter (Signed)
Dr. Dallas Schimke pt - spoke w/the patient, she stated she is having throbbing on her right ankle, but on the left side, not the side she had surgery on.  Not able to sleep at night.  Surgery date was 02/28/23.  She stated that she does not have any pain medication.  She uses [facility].  I scheduled her with Dr. Dallas Schimke for 8/07, but please let me know if I need to do something different.

## 2023-04-18 NOTE — Telephone Encounter (Signed)
Requested Prescriptions   Signed Prescriptions Disp Refills   traMADol (ULTRAM) 50 MG tablet 30 tablet 0    Sig: Take 1 tablet (50 mg total) by mouth every 6 (six) hours as needed.    Authorizing Provider: Fuller [place] E   ibuprofen (ADVIL) 800 MG tablet 60 tablet 2    Sig: Take 1 tablet (800 mg total) by mouth every 8 (eight) hours as needed.    Authorizing Provider: Vickki Hearing

## 2023-04-18 NOTE — Telephone Encounter (Signed)
I called her to advise. Left message to advise meds sent in if she needs anything else let us know

## 2023-04-23 ENCOUNTER — Telehealth: Payer: Self-pay | Admitting: Orthopedic Surgery

## 2023-04-23 NOTE — Telephone Encounter (Signed)
I also left message for her last week when Dr Rexene Edison sent in the meds

## 2023-04-23 NOTE — Telephone Encounter (Signed)
Dr. Dallas Schimke pt - returned the patient's call, lvm for her to call us back.

## 2023-04-24 ENCOUNTER — Ambulatory Visit (INDEPENDENT_AMBULATORY_CARE_PROVIDER_SITE_OTHER): Payer: BC Managed Care – PPO | Admitting: Orthopedic Surgery

## 2023-04-24 ENCOUNTER — Encounter: Payer: Self-pay | Admitting: Orthopedic Surgery

## 2023-04-24 ENCOUNTER — Other Ambulatory Visit (INDEPENDENT_AMBULATORY_CARE_PROVIDER_SITE_OTHER): Payer: BC Managed Care – PPO

## 2023-04-24 DIAGNOSIS — S82841D Displaced bimalleolar fracture of right lower leg, subsequent encounter for closed fracture with routine healing: Secondary | ICD-10-CM | POA: Diagnosis not present

## 2023-04-24 DIAGNOSIS — D573 Sickle-cell trait: Secondary | ICD-10-CM | POA: Insufficient documentation

## 2023-04-24 MED ORDER — TRAMADOL HCL 50 MG PO TABS: 50.0000 mg | ORAL_TABLET | Freq: Four times a day (QID) | ORAL | 0 refills | Status: DC | PRN

## 2023-04-24 NOTE — Progress Notes (Signed)
Orthopaedic Postop Note  Assessment: [name] is a 52 y.o. female s/p ORIF of Right ankle fracture  DOS: 02/28/2023  Plan: Mrs. Mceuen continues to do well.  She continues to improve.  She does have some aching in the ankle.  Radiographs remain stable.  She has transition to a regular shoe.  Continue medications as needed.  Provided her with updated prescription for tramadol.  Okay to alternate between a regular shoe and a walking boot, especially if she is having some discomfort.  Provided reassurance.  I would like see her back in 2 weeks.  Follow-up: Return in about 2 weeks (around 05/08/2023) for Appointment already scheduled. XR at next visit: Right ankle  Subjective:  Chief Complaint  Patient presents with   Post-op Follow-up    Ankle / right has some pain medially has used the Tramadol Dr Rexene Edison called in and feels better Using ankle wrap helps also 02/28/23 date of surgery     History of Present Illness: [name] is a 52 y.o. female who presents following the above stated procedure.  Surgery was approximately 2 months ago.  She has transitioned out of the walking boot.  She has some throbbing sensations in both the medial and lateral aspect of the ankle.  No specific injury.  She has been taking tramadol, which is helping with her pain.  No issues with her surgical incisions.  She is walking with the assistance of a cane.  Review of Systems: No fevers or chills No numbness or tingling No Chest Pain No shortness of breath   Objective: LMP 06/23/2015   Physical Exam:  Alert and oriented.  No acute distress.  Antalgic gait, with the use of a cane.  Evaluation of right ankle demonstrates no swelling.  Mild tenderness palpation of the medial lateral ankle.  Surgical incision is healing well.  No surrounding erythema or drainage.  She tolerates gentle range of motion.  Sensation intact over the dorsum of the foot.  IMAGING: I personally ordered and reviewed the  following images  X-rays of the right ankle were obtained in clinic today.  These are compared to prior x-rays.  There has been no interval displacement of the fracture.  Hardware remains intact.  Screws are not backing out.  The mortise is congruent.  No syndesmotic disruption.  No bony lesions.  Impression: Healed right bimalleolar ankle fracture without hardware failure  Oliver Barre, MD 04/24/2023 4:03 PM

## 2023-05-06 ENCOUNTER — Other Ambulatory Visit: Payer: Self-pay | Admitting: Orthopedic Surgery

## 2023-05-14 ENCOUNTER — Other Ambulatory Visit (INDEPENDENT_AMBULATORY_CARE_PROVIDER_SITE_OTHER): Payer: BC Managed Care – PPO

## 2023-05-14 ENCOUNTER — Encounter: Payer: Self-pay | Admitting: Orthopedic Surgery

## 2023-05-14 ENCOUNTER — Ambulatory Visit (INDEPENDENT_AMBULATORY_CARE_PROVIDER_SITE_OTHER): Payer: BC Managed Care – PPO | Admitting: Orthopedic Surgery

## 2023-05-14 ENCOUNTER — Other Ambulatory Visit: Payer: Self-pay | Admitting: Orthopedic Surgery

## 2023-05-14 DIAGNOSIS — S82841D Displaced bimalleolar fracture of right lower leg, subsequent encounter for closed fracture with routine healing: Secondary | ICD-10-CM | POA: Diagnosis not present

## 2023-05-14 NOTE — Patient Instructions (Signed)
Please provide a note for work.  Okay to return to work without restrictions 05/21/2023

## 2023-05-14 NOTE — Progress Notes (Signed)
Orthopaedic Postop Note  Assessment: [name] is a 52 y.o. female s/p ORIF of Right ankle fracture  DOS: 02/28/2023  Plan: [name] is given.  She has been ambulating well in a walking boot.  She will transition to a regular shoe.  She can return to work next week.  Work note has been provided.  Family member is a physical therapist, so she will continue to work on her exercises.  Follow-up in 6 weeks  Follow-up: Return in about 6 weeks (around 06/25/2023). XR at next visit: Right ankle  Subjective:  Chief Complaint  Patient presents with   Routine Post Op    R ankle DOS 02/28/23    History of Present Illness: [name] is a 52 y.o. female who presents following the above stated procedure.  Surgery was almost 3 months ago.  She continues to ambulate with the assistance of a walking boot.  She is doing well.  She has been doing therapy exercises at home.  No numbness or tingling.  She not taking anything for pain.  She notes swelling at the end of the day.    Review of Systems: No fevers or chills No numbness or tingling No Chest Pain No shortness of breath   Objective: LMP 06/23/2015   Physical Exam:  Alert and oriented.  No acute distress.  Steady gait in a walking boot.  Using a cane.  Evaluation of the right ankle demonstrates no swelling.  She has no tenderness to palpation.  The surgical incision is healed.  Intact sensation over the dorsum of the foot.  A few degrees of dorsiflexion with her knee extended.  Improved dorsiflexion with her knee bent.  She does demonstrate some tightness and heel cord on the left as well.  IMAGING: I personally ordered and reviewed the following images  X-ray of the right ankle were obtained in clinic today.  These are compared to available x-rays.  Fracture is healed.  No fracture lines are visible.  Hardware remains intact.  The screws are backing out.  Mortise is congruent.  No syndesmotic disruption.  No bony  lesions.  Impression: Healed bimalleolar right ankle fracture following surgery  Oliver Barre, MD 05/14/2023 9:54 AM

## 2023-05-28 ENCOUNTER — Other Ambulatory Visit: Payer: Self-pay | Admitting: Orthopedic Surgery

## 2023-06-18 ENCOUNTER — Other Ambulatory Visit: Payer: Self-pay | Admitting: Orthopedic Surgery

## 2023-06-25 ENCOUNTER — Ambulatory Visit: Payer: BC Managed Care – PPO | Admitting: Orthopedic Surgery

## 2023-06-28 ENCOUNTER — Other Ambulatory Visit: Payer: Self-pay | Admitting: Medical Genetics

## 2023-06-28 ENCOUNTER — Other Ambulatory Visit: Payer: Self-pay | Admitting: Orthopedic Surgery

## 2023-06-28 DIAGNOSIS — Z006 Encounter for examination for normal comparison and control in clinical research program: Secondary | ICD-10-CM

## 2023-07-04 ENCOUNTER — Telehealth: Payer: Self-pay | Admitting: Orthopedic Surgery

## 2023-07-04 NOTE — Telephone Encounter (Signed)
Dr. Dallas Schimke pt - pt lvm stating her requesting for a refill on Tramadol was denied, she'd like to know why.

## 2023-07-05 ENCOUNTER — Other Ambulatory Visit (INDEPENDENT_AMBULATORY_CARE_PROVIDER_SITE_OTHER): Payer: BC Managed Care – PPO

## 2023-07-05 ENCOUNTER — Ambulatory Visit (INDEPENDENT_AMBULATORY_CARE_PROVIDER_SITE_OTHER): Payer: BC Managed Care – PPO | Admitting: Orthopedic Surgery

## 2023-07-05 ENCOUNTER — Encounter: Payer: Self-pay | Admitting: Orthopedic Surgery

## 2023-07-05 DIAGNOSIS — S82841D Displaced bimalleolar fracture of right lower leg, subsequent encounter for closed fracture with routine healing: Secondary | ICD-10-CM

## 2023-07-05 MED ORDER — TRAMADOL HCL 50 MG PO TABS: 50.0000 mg | ORAL_TABLET | Freq: Four times a day (QID) | ORAL | 0 refills | Status: DC | PRN

## 2023-07-05 NOTE — Progress Notes (Signed)
Orthopaedic Postop Note  Assessment: [name] is a 52 y.o. female s/p ORIF of Right ankle fracture  DOS: 02/28/2023  Plan: [name] continues to improve.  She is now walking in a regular shoe.  She has returned to work.  She does continue to have some aching in the lateral and medial aspect of the right ankle.  Radiographs remain stable.  On physical exam, her incision has healed very well.  Minimal swelling about the ankle.  She is able to get to a plantigrade position, but has limited dorsiflexion beyond neutral.  She is slightly restricted compared to the contralateral side.  Urged her to continue working on therapy, and anticipate that she will continue to improve, for up to a year following surgery.  Follow-up: Return in about 3 months (around 10/05/2023). XR at next visit: Right ankle  Subjective:  Chief Complaint  Patient presents with   Post-op Follow-up    C/o pain right ankle Tramadol was helping but not anymore     History of Present Illness: [name] is a 52 y.o. female who presents following the above stated procedure.  Surgery was about 4 months ago.  She is now using a regular shoe.  She is doing exercises on her own.  She does have some tightness and heel cord.  She has occasional aching in the medial and lateral aspect of the ankle.  No recent injuries.   Review of Systems: No fevers or chills No numbness or tingling No Chest Pain No shortness of breath   Objective: LMP 06/23/2015   Physical Exam:  Alert and oriented.  No acute distress.  Right-sided antalgic gait in a regular shoe.  Right ankle without swelling.  No point tenderness.  Lateral incision is healed.  No surrounding erythema or drainage.  She tolerates inversion and eversion of the ankle without pain.  She demonstrates tightness of the heel cord.  Limited dorsiflexion on the left, compared to the right.  Toes warm well-perfused.  Sensation intact over the dorsum of the  foot.  IMAGING: I personally ordered and reviewed the following images  X-rays of the right ankle were obtained in clinic today.  These are compared to prior x-rays.  Hardware in the lateral ankle remains intact.  No screws are broken.  Screws are not backing out.  Mortise is congruent.  No syndesmotic disruption.  Impression: Right ankle fracture in stable alignment without hardware failure.  Oliver Barre, MD 07/05/2023 11:05 AM

## 2023-07-23 ENCOUNTER — Other Ambulatory Visit: Payer: Self-pay | Admitting: Orthopedic Surgery

## 2023-08-13 NOTE — Progress Notes (Unsigned)
NEUROLOGY CONSULTATION NOTE  [name] MRN: [id number] DOB: [birth year]  Referring provider: Shireen Quan, MD (hospital referral) Primary care provider: Sharlene Dory, NP  Reason for consult:  stroke  Assessment/Plan:   Right thalamic infarct, most likely secondary to small vessel disease but cannot rule out cardioembolic source. Hypertension Type 2 diabetes mellitus, uncontrolled Palpitations Hyperlipidemia Intracranial stenosis, moderate Tobacco use disorder Snoring, concern for obstructive sleep apnea   Secondary stroke prevention: Will add Plavix 75mg  daily to take in addition to ASA 81mg  daily for 17 days followed by ASA 81mg  daily alone. Increase atorvastatin to 80mg  daily.  LDL goal less than 70.  Repeat lipid panel in 2.5 months. Normotensive blood pressure Hgb A1c goal less than 7 Discussed tobacco cessation Mediterranean diet Refer to cardiology for further evaluation of palpitations, likely needs cardiac event monitor. 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     Subjective:  [name] is a 52 year old right-handed female with HTN, DM 2, Sickle cell trait and asthma who presents for stroke.  History supplemented by hospital records.  She is accompanied by her husband.   She woke up around 11 PM on 08/07/2023 with left upper extremity and left sided facial numbness.  No headache, blurred vision, speech disturbance, focal weakness or numbness involving the left lower extremity.  She was admitted to [facility] and admitted for stroke for stroke.  Did not receive tPA or TNK.  CT head revealed no acute findings.  CTA of head and neck revealed moderate left intracranial ICA stenosis and moderate bilateral intradural vertebral artery stenosis proximally on the left and distally on the right,  but no LVO.  MRI of brain revealed small acute right thalamic infarct.  2D echo showed LVEF >55%, grade 1 diastolic dysfunction and mild mitral annular calcification but no significant valvular disease or evidence of an interatrial flow communication or intrapulmonary shunt by agitated saline study.  LDL was 144.  Hgb A1c was 9.7.  She was discharged on ASA 81mg  daily and atorvastatin 20mg  daily.    She was ordered PT/OT but hasn't received a call yet to schedule.  She endorsed snoring and daytime fatigue.  She was referred for a sleep study but also has not received a call to schedule.  She is a smoker, smokes 1 ppd.  Since hospital discharge, she has been smoking only 4 cigarettes daily.  She continues to have left hemisensory loss involving face, torso, arm and leg.  Has difficulty walking and with dexterity of her left hand.  She reports some pain in the lower left lateral torso.    She reports that she occasionally feels palpitations.  This is recent, which began prior to her stroke.    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     PAST MEDICAL HISTORY: Past Medical History:  Diagnosis Date   Anemia    Anxiety    Asthma    Diabetes mellitus without complication (HCC)    Fibroids    Hypertension    Sickle cell trait (HCC)     PAST SURGICAL HISTORY: Past Surgical History:  Procedure Laterality Date   ABDOMINAL HYSTERECTOMY N/A 10/04/2015   Procedure: HYSTERECTOMY ABDOMINAL;  Surgeon: Tilda Burrow, MD;  Location: [facility];  Service: Gynecology;  Laterality:  N/A;   APPENDECTOMY     BILATERAL SALPINGECTOMY Bilateral 10/04/2015   Procedure: BILATERAL SALPINGECTOMY;  Surgeon: Tilda Burrow, MD;  Location: [facility];  Service: Gynecology;  Laterality: Bilateral;   ORIF ANKLE FRACTURE Right 02/28/2023   Procedure: OPEN REDUCTION INTERNAL FIXATION (ORIF) ANKLE FRACTURE;  Surgeon: Oliver Barre, MD;  Location: [facility];  Service: Orthopedics;   Laterality: Right;   TUBAL LIGATION      MEDICATIONS: Current Outpatient Medications on File Prior to Visit  Medication Sig Dispense Refill   albuterol (VENTOLIN HFA) 108 (90 Base) MCG/ACT inhaler Inhale 2 puffs into the lungs every 4 (four) hours as needed for wheezing or shortness of breath.     amLODipine (NORVASC) 10 MG tablet Take 1 tablet (10 mg total) by mouth every morning.     atenolol (TENORMIN) 50 MG tablet Take 50 mg by mouth daily.     atorvastatin (LIPITOR) 20 MG tablet Take 20 mg by mouth at bedtime.     chlorhexidine (PERIDEX) 0.12 % solution Use as directed 15 mLs in the mouth or throat 2 (two) times daily. 120 mL 0   escitalopram (LEXAPRO) 20 MG tablet Take 20 mg by mouth daily.      ibuprofen (ADVIL) 800 MG tablet Take 1 tablet (800 mg total) by mouth every 8 (eight) hours as needed. 60 tablet 2   Insulin Glargine (LANTUS) 100 UNIT/ML Solostar Pen Inject 45 Units into the skin daily. (Patient taking differently: Inject 45 Units into the skin in the morning.) 15 mL 0   insulin lispro (HUMALOG) 100 UNIT/ML KwikPen Inject 15 Units into the skin 3 (three) times daily before meals.     lisinopril-hydrochlorothiazide (PRINZIDE,ZESTORETIC) 10-12.5 MG tablet Take 1 tablet by mouth daily.  2   metFORMIN (GLUCOPHAGE-XR) 500 MG 24 hr tablet Take 2 tablets (1,000 mg total) by mouth 2 (two) times daily with a meal for 30 days. 120 tablet 0   montelukast (SINGULAIR) 10 MG tablet Take 10 mg by mouth at bedtime.      naproxen (NAPROSYN) 375 MG tablet Take 1 tablet (375 mg total) by mouth 2 (two) times daily. 20 tablet 0   potassium chloride (KLOR-CON M) 10 MEQ tablet Take 1 tablet (10 mEq total) by mouth 2 (two) times daily for 5 days. 10 tablet 0   tiZANidine (ZANAFLEX) 4 MG tablet Take 1 tablet (4 mg total) by mouth at bedtime. 30 tablet 0   tizanidine (ZANAFLEX) 6 MG capsule Take 1 capsule (6 mg total) by mouth 3 (three) times daily as needed for muscle spasms. Do not drink alcohol or  drive while taking this medication. May cause drowsiness 15 capsule 0   traMADol (ULTRAM) 50 MG tablet TAKE 1 TABLET BY MOUTH EVERY 6 HOURS AS NEEDED 20 tablet 0   No current facility-administered medications on file prior to visit.    ALLERGIES: No Known Allergies  FAMILY HISTORY: Family History  Problem Relation Age of Onset   Diabetes Father    Asthma Brother    Hypertension Mother    Allergic rhinitis Neg Hx    Eczema Neg Hx    Immunodeficiency Neg Hx    Urticaria Neg Hx     Objective:  Blood pressure (!) 141/89, pulse (!) 59, height 5\' 3"  (1.6 m), weight 194 lb 9.6 oz (88.3 kg), last menstrual period 06/23/2015, SpO2 98%. General: No acute distress.  Patient appears well-groomed.   Head:  Normocephalic/atraumatic Eyes:  fundi examined but not visualized Neck: supple,  left lower paraspinal tenderness, full range of motion Heart: regular rate and rhythm Vascular: No carotid bruits. Neurological Exam: Mental status: alert and oriented to person, place, and time, speech fluent and not dysarthric, language intact. Cranial nerves: CN I: not tested CN II: pupils equal, round and reactive to light, visual fields intact CN III, IV, VI:  full range of motion, no nystagmus, no ptosis CN V: reduced sensation and hyperesthesia of left V1-V3 CN VII: upper and lower face symmetric CN VIII: hearing intact CN IX, X: gag intact, uvula midline CN XI: sternocleidomastoid and trapezius muscles intact CN XII: tongue midline Bulk & Tone: normal, no fasciculations. Motor:  muscle strength 5-/5 left biceps/triceps, otherwise 5/5.  Reduced finger-thumb tapping speed and amplitude with decreased dexterity on left.  Sensation:  Pinprick sensation with hyperesthesia of the left upper and lower extremities; vibratory sensation intact. Deep Tendon Reflexes:  2+ throughout,  toes downgoing.   Finger to nose testing:  Sensory ataxia on the left. Gait:  Cautious gait, hemiparetic gait.  Romberg  negative.    Thank you for allowing me to take part in the care of this patient.  Shon Millet, DO  CC:  Sharlene Dory, NP

## 2023-08-14 ENCOUNTER — Encounter: Payer: Self-pay | Admitting: Neurology

## 2023-08-14 ENCOUNTER — Ambulatory Visit (INDEPENDENT_AMBULATORY_CARE_PROVIDER_SITE_OTHER): Payer: BC Managed Care – PPO | Admitting: Neurology

## 2023-08-14 VITALS — BP 141/89 | HR 59 | Ht 63.0 in | Wt 194.6 lb

## 2023-08-14 DIAGNOSIS — I6523 Occlusion and stenosis of bilateral carotid arteries: Secondary | ICD-10-CM

## 2023-08-14 DIAGNOSIS — E785 Hyperlipidemia, unspecified: Secondary | ICD-10-CM | POA: Diagnosis not present

## 2023-08-14 DIAGNOSIS — I6381 Other cerebral infarction due to occlusion or stenosis of small artery: Secondary | ICD-10-CM | POA: Diagnosis not present

## 2023-08-14 DIAGNOSIS — E1165 Type 2 diabetes mellitus with hyperglycemia: Secondary | ICD-10-CM

## 2023-08-14 DIAGNOSIS — R002 Palpitations: Secondary | ICD-10-CM

## 2023-08-14 DIAGNOSIS — I10 Essential (primary) hypertension: Secondary | ICD-10-CM | POA: Diagnosis not present

## 2023-08-14 MED ORDER — ATORVASTATIN CALCIUM 80 MG PO TABS: 80.0000 mg | ORAL_TABLET | Freq: Every day | ORAL | 3 refills | Status: AC

## 2023-08-14 MED ORDER — CLOPIDOGREL BISULFATE 75 MG PO TABS: 75.0000 mg | ORAL_TABLET | Freq: Every day | ORAL | 0 refills | Status: AC

## 2023-08-14 NOTE — Patient Instructions (Addendum)
Start clopidogrel 75mg  daily for 17 days and then stop Continue aspirin 81mg  daily Stop atorvastatin 20mg .  Start atorvastatin 80mg  daily.   Repeat lipid panel in 2.5 months. Continue blood pressure and diabetes medication Start Mediterranean diet (See below) Continue working on quitting smoking Order physical therapy and occupational therapy.   [facility] at [facility] in: [facility] Address: [address] [place], [place] [address] Hours:  Open ? Closes 6?PM Phone: [phone] Refer to cardiology for further evaluation of palpitations and further testing. May go to work but no physical activity at this time Follow up in 3 months.     Mediterranean Diet A Mediterranean diet is based on the traditions of countries on the [employer]. It focuses on eating more: Fruits and vegetables. Whole grains, beans, nuts, and seeds. Heart-healthy fats. These are fats that are good for your heart. It involves eating less: Dairy. Meat and eggs. Processed foods with added sugar, salt, and fat. This type of diet can help prevent certain conditions. It can also improve outcomes if you have a long-term (chronic) disease, such as kidney or heart disease. What are tips for following this plan? Reading food labels Check packaged foods for: The serving size. For foods such as rice and pasta, the serving size is the amount of cooked product, not dry. The total fat. Avoid foods with saturated fat or trans fat. Added sugars, such as corn syrup. Shopping  Try to have a balanced diet. Buy a variety of foods, such as: Fresh fruits and vegetables. You may be able to get these from local farmers markets. You can also buy them frozen. Grains, beans, nuts, and seeds. Some of these can be bought in bulk. Fresh seafood. Poultry and eggs. Low-fat dairy products. Buy whole ingredients instead of foods that have already been packaged. If you can't get fresh  seafood, buy precooked frozen shrimp or canned fish, such as tuna, salmon, or sardines. Stock your pantry so you always have certain foods on hand, such as olive oil, canned tuna, canned tomatoes, rice, pasta, and beans. Cooking Cook foods with extra-virgin olive oil instead of using butter or other vegetable oils. Have meat as a side dish. Have vegetables or grains as your main dish. This means having meat in small portions or adding small amounts of meat to foods like pasta or stew. Use beans or vegetables instead of meat in common dishes like chili or lasagna. Try out different cooking methods. Try roasting, broiling, steaming, and sauting vegetables. Add frozen vegetables to soups, stews, pasta, or rice. Add nuts or seeds for added healthy fats and plant protein at each meal. You can add these to yogurt, salads, or vegetable dishes. Marinate fish or vegetables using olive oil, lemon juice, garlic, and fresh herbs. Meal planning Plan to eat a vegetarian meal one day each week. Try to work up to two vegetarian meals, if possible. Eat seafood two or more times a week. Have healthy snacks on hand. These may include: Vegetable sticks with hummus. Greek yogurt. Fruit and nut trail mix. Eat balanced meals. These should include: Fruit: 2-3 servings a day. Vegetables: 4-5 servings a day. Low-fat dairy: 2 servings a day. Fish, poultry, or lean meat: 1 serving a day. Beans and legumes: 2 or more servings a week. Nuts and seeds: 1-2 servings a day. Whole grains: 6-8 servings a day. Extra-virgin olive oil: 3-4 servings a day. Limit red meat and sweets to just a few servings a  month. Lifestyle  Try to cook and eat meals with your family. Drink enough fluid to keep your pee (urine) pale yellow. Be active every day. This includes: Aerobic exercise, which is exercise that causes your heart to beat faster. Examples include running and swimming. Leisure activities like gardening, walking, or  housework. Get 7-8 hours of sleep each night. Drink red wine if your provider says you can. A glass of wine is 5 oz (150 mL). You may be allowed to have: Up to 1 glass a day if you're female and not pregnant. Up to 2 glasses a day if you're female. What foods should I eat? Fruits Apples. Apricots. Avocado. Berries. Bananas. Cherries. Dates. Figs. Grapes. Lemons. Melon. Oranges. Peaches. Plums. Pomegranate. Vegetables Artichokes. Beets. Broccoli. Cabbage. Carrots. Eggplant. Green beans. Chard. Kale. Spinach. Onions. Leeks. Peas. Squash. Tomatoes. Peppers. Radishes. Grains Whole-grain pasta. Brown rice. Bulgur wheat. Polenta. Couscous. Whole-wheat bread. Orpah Cobb. Meats and other proteins Beans. Almonds. Sunflower seeds. Pine nuts. Peanuts. Cod. Salmon. Scallops. Shrimp. Tuna. Tilapia. Clams. Oysters. Eggs. Chicken or [place] without skin. Dairy Low-fat milk. Cheese. Greek yogurt. Fats and oils Extra-virgin olive oil. Avocado oil. Grapeseed oil. Beverages Water. Red wine. Herbal tea. Sweets and desserts Greek yogurt with honey. Baked apples. Poached pears. Trail mix. Seasonings and condiments Basil. Cilantro. Coriander. Cumin. Mint. Parsley. Sage. Rosemary. Tarragon. Garlic. Oregano. Thyme. Pepper. Balsamic vinegar. Tahini. Hummus. Tomato sauce. Olives. Mushrooms. The items listed above may not be all the foods and drinks you can have. Talk to a dietitian to learn more. What foods should I limit? This is a list of foods that should be eaten rarely. Fruits Fruit canned in syrup. Vegetables Deep-fried potatoes, like [place] fries. Grains Packaged pasta or rice dishes. Cereal with added sugar. Snacks with added sugar. Meats and other proteins Beef. Pork. Lamb. Chicken or [place] with skin. Hot dogs. Tomasa Blase. Dairy Ice cream. Sour cream. Whole milk. Fats and oils Butter. Canola oil. Vegetable oil. Beef fat (tallow). Lard. Beverages Juice. Sugar-sweetened soft drinks. Beer. Liquor  and spirits. Sweets and desserts Cookies. Cakes. Pies. Candy. Seasonings and condiments Mayonnaise. Pre-made sauces and marinades. The items listed above may not be all the foods and drinks you should limit. Talk to a dietitian to learn more. Where to find more information American Heart Association (AHA): heart.org This information is not intended to replace advice given to you by your health care provider. Make sure you discuss any questions you have with your health care provider. Document Revised: 12/16/2022 Document Reviewed: 12/16/2022 Elsevier Patient Education  2024 [employer].

## 2023-08-20 ENCOUNTER — Other Ambulatory Visit: Payer: Self-pay

## 2023-08-20 ENCOUNTER — Encounter (HOSPITAL_COMMUNITY): Payer: Self-pay | Admitting: Occupational Therapy

## 2023-08-20 ENCOUNTER — Ambulatory Visit (HOSPITAL_COMMUNITY): Payer: BC Managed Care – PPO | Attending: Internal Medicine | Admitting: Occupational Therapy

## 2023-08-20 DIAGNOSIS — M6281 Muscle weakness (generalized): Secondary | ICD-10-CM | POA: Insufficient documentation

## 2023-08-20 DIAGNOSIS — R29818 Other symptoms and signs involving the nervous system: Secondary | ICD-10-CM | POA: Diagnosis present

## 2023-08-20 DIAGNOSIS — R2681 Unsteadiness on feet: Secondary | ICD-10-CM | POA: Diagnosis present

## 2023-08-20 DIAGNOSIS — R278 Other lack of coordination: Secondary | ICD-10-CM | POA: Diagnosis present

## 2023-08-20 NOTE — Patient Instructions (Signed)
Strengthening Exercises: *Complete exercises using 1-2 pound weight, 10 times each, 2-3 times per day*  1) WRIST EXTENSION CURLS - TABLE  Hold a small free weight, rest your forearm on a table and bend your wrist up and down with your palm face down as shown.      2) WRIST FLEXION CURLS - TABLE  Hold a small free weight, rest your forearm on a table and bend your wrist up and down with your palm face up as shown.     3) FREE WEIGHT RADIAL/ULNAR DEVIATION - TABLE  Hold a small free weight, rest your forearm on a table and bend your wrist up and down with your palm facing towards the side as shown.     4) Pronation  Forearm supported on table with wrist in neutral position. Using a weight, roll wrist so that palm faces downward. Hold for 2 seconds and return to starting position.     5) Supination  Forearm supported on table with wrist in neutral position. Using a weight, roll wrist so that palm is now facing upward. Hold for 2 seconds and return to starting position.       Home Exercises Program Theraputty Exercises  Do the following exercises 2 times a day using your affected hand.  1. Roll putty into a ball.  2. Make into a pancake.  3. Roll putty into a roll.  4. Pinch along log with first finger and thumb.   5. Make into a ball.  6. Roll it back into a log.   7. Pinch using thumb and side of first finger.  8. Roll into a ball, then flatten into a pancake.  9. Using your fingers, make putty into a mountain.  10. Roll putty back into a ball and squeeze gently for 2-3 minutes.

## 2023-08-20 NOTE — Therapy (Unsigned)
OUTPATIENT OCCUPATIONAL THERAPY NEURO EVALUATION  Patient Name: [name] MRN: [id number] DOB:Mar 07, 1971, 52 y.o., female Today's Date: 08/20/2023    END OF SESSION:  OT End of Session - 08/20/23 1426     Visit Number 1    Number of Visits 6    Date for OT Re-Evaluation 10/01/23    Authorization Type BCBS    Authorization Time Period 18 visit limit; requesting 6 visits    Authorization - Visit Number 1    Authorization - Number of Visits 18    OT Start Time 1348    OT Stop Time 1420    OT Time Calculation (min) 32 min    Activity Tolerance Patient tolerated treatment well    Behavior During Therapy WFL for tasks assessed/performed             Past Medical History:  Diagnosis Date   Anemia    Anxiety    Asthma    Diabetes mellitus without complication (HCC)    Fibroids    Hypertension    Sickle cell trait (HCC)    Past Surgical History:  Procedure Laterality Date   ABDOMINAL HYSTERECTOMY N/A 10/04/2015   Procedure: HYSTERECTOMY ABDOMINAL;  Surgeon: Tilda Burrow, MD;  Location: [facility];  Service: Gynecology;  Laterality: N/A;   APPENDECTOMY     BILATERAL SALPINGECTOMY Bilateral 10/04/2015   Procedure: BILATERAL SALPINGECTOMY;  Surgeon: Tilda Burrow, MD;  Location: [facility];  Service: Gynecology;  Laterality: Bilateral;   ORIF ANKLE FRACTURE Right 02/28/2023   Procedure: OPEN REDUCTION INTERNAL FIXATION (ORIF) ANKLE FRACTURE;  Surgeon: Oliver Barre, MD;  Location: [facility];  Service: Orthopedics;  Laterality: Right;   TUBAL LIGATION     Patient Active Problem List   Diagnosis Date Noted   Sickle cell trait (HCC) 04/24/2023   DM (diabetes mellitus) type 2, uncontrolled, with ketoacidosis (HCC) 10/25/2018   DKA (diabetic ketoacidosis) (HCC) 10/24/2018   Hypertension associated with diabetes (HCC) 10/24/2018   AKI (acute kidney injury) (HCC) 10/24/2018   Hypokalemia 10/24/2018   Bronchitis 10/24/2018   S/P total abdominal hysterectomy 10/04/2015    Excessive or frequent menstruation 07/20/2013   OBESITY 01/07/2008   Tobacco use 01/07/2008   PCP: Sharlene Dory, NP REFERRING PROVIDER: Dr. Trevor Mace  ONSET DATE: 08/08/23  REFERRING DIAG: s/p left CVA  THERAPY DIAG:  Other symptoms and signs involving the nervous system  Other lack of coordination  Rationale for Evaluation and Treatment: Rehabilitation  SUBJECTIVE:   SUBJECTIVE STATEMENT: "Everything on the left side went numb from my face down."  Pt accompanied by: self  PERTINENT HISTORY: Pt is a 52 y/o female s/p left CVA on 08/08/23. Pt reports symptoms were tingling/numbness in LUE and LLE, developed some weakness as well.   PRECAUTIONS: None  WEIGHT BEARING RESTRICTIONS: No  PAIN:  Are you having pain? No  FALLS: Has patient fallen in last 6 months? No  PLOF: Independent  PATIENT GOALS: To be able to use the left hand.   OBJECTIVE:  Note: Objective measures were completed at Evaluation unless otherwise noted.  HAND DOMINANCE: Right  ADLs: Overall ADLs: Pt is having difficulty with holding cups-drops or holds too tight. Pt reports difficulty with fixing hair, tying shoes. Pt typically sleeps on stomach, reports unable to lay on stomach due to discomfort in her left side. Pt reports feeling overall weaker on the left side.    FUNCTIONAL OUTCOME MEASURES: Quick Dash: 45.45  UPPER EXTREMITY ROM:  ROM is WFL throughout LUE  UPPER EXTREMITY MMT:     MMT Left eval  Shoulder flexion 5/  Shoulder abduction 5/5  Shoulder internal rotation 5/5  Shoulder external rotation 5/5  Elbow flexion 5/5  Elbow extension 4+/5  Wrist flexion 4/5  Wrist extension 5/5  Wrist ulnar deviation 4/5  Wrist radial deviation 4/5  Wrist pronation 4+/5  Wrist supination 5/5  (Blank rows = not tested)  HAND FUNCTION: Grip strength: Right: 50 lbs; Left: 30 lbs, Lateral pinch: Right: 16 lbs, Left: 12 lbs, and 3 point pinch: Right: 10 lbs, Left: 10  lbs  COORDINATION: 9 Hole Peg test: Right: 29.88 sec; Left: 55.02 sec  SENSATION: Mild tingling  EDEMA: None  COGNITION: Overall cognitive status: Within functional limits for tasks assessed  VISION: Subjective report: No change Baseline vision: No visual deficits Visual history:  None   TODAY'S TREATMENT:                                                                                                                              DATE:  Eval -Wrist strengthening: flexion, extension, ulnar/radial deviation, pronation/supination, 10 reps  PATIENT EDUCATION: Education details: wrist strengthening, theraputty grip and pinch strengthening Person educated: Patient Education method: Explanation, Demonstration, and Handouts Education comprehension: verbalized understanding and returned demonstration  HOME EXERCISE PROGRAM: Eval: wrist strengthening, theraputty grip and pinch strengthening   GOALS: Goals reviewed with patient? Yes  SHORT TERM GOALS: Target date: 09/10/23  Pt will be provided with and educated on HEP to improve LUE strength required for use during ADLs.   Goal status: INITIAL   LONG TERM GOALS: Target date: 10/01/23  Pt will increase LUE strength to 5/5 throughout to improve ability to lift and carry objects using LUE as non-dominant.   Goal status: INITIAL  2.  Pt will increase left grip strength by 10# and pinch strength by 2# to improve ability to grasp and hold objects when completing housekeeping or meal preparation.   Goal status: INITIAL  3.  Pt will improve left hand coordination required for tying shoes, fixing hair, by completing 9 hole peg test in under 40".   Goal status: INITIAL    ASSESSMENT:  CLINICAL IMPRESSION: Patient is a 52 y.o. female who was seen today for occupational therapy evaluation s/p CVA. Pt with right CVA on 08/08/23 with residual LUE and LLE weakness and coordination deficits. Pt reports improvement since discharging  from [facility], continues to have deficits with LUE functional use compared to baseline, impacting ability to complete ADLs incorporating LUE as non-dominant.    PERFORMANCE DEFICITS: in functional skills including ADLs, IADLs, coordination, strength, and UE functional use  IMPAIRMENTS: are limiting patient from ADLs, IADLs, work, and leisure.   CO-MORBIDITIES: has no other co-morbidities that affects occupational performance. Patient will benefit from skilled OT to address above impairments and improve overall function.  MODIFICATION OR ASSISTANCE TO COMPLETE EVALUATION: No modification of tasks or assist necessary  to complete an evaluation.  OT OCCUPATIONAL PROFILE AND HISTORY: Problem focused assessment: Including review of records relating to presenting problem.  CLINICAL DECISION MAKING: LOW - limited treatment options, no task modification necessary  REHAB POTENTIAL: Good  EVALUATION COMPLEXITY: Low    PLAN:  OT FREQUENCY: 1x/week  OT DURATION: 6 weeks  PLANNED INTERVENTIONS: 97168 OT Re-evaluation, 97535 self care/ADL training, 16109 therapeutic exercise, 97530 therapeutic activity, 97112 neuromuscular re-education, 97032 electrical stimulation (manual), 97014 electrical stimulation unattended, patient/family education, and DME and/or AE instructions  RECOMMENDED OTHER SERVICES: None   CONSULTED AND AGREED WITH PLAN OF CARE: Patient  PLAN FOR NEXT SESSION: Follow up on HEP, begin LUE strengthening and coordination work   [employer], OTR/L  [phone] 08/20/2023, 2:28 PM

## 2023-08-21 ENCOUNTER — Ambulatory Visit (HOSPITAL_COMMUNITY): Payer: BC Managed Care – PPO | Admitting: Occupational Therapy

## 2023-08-28 ENCOUNTER — Telehealth: Payer: Self-pay | Admitting: Neurology

## 2023-08-28 NOTE — Telephone Encounter (Signed)
Pt called in stating she is trying to go back to work. She is now able to move and she thinks she can do her job now. She needs a note written so she can go back to work.

## 2023-08-29 ENCOUNTER — Ambulatory Visit (HOSPITAL_COMMUNITY): Payer: BC Managed Care – PPO

## 2023-08-29 ENCOUNTER — Encounter (HOSPITAL_COMMUNITY): Payer: Self-pay | Admitting: Occupational Therapy

## 2023-08-29 ENCOUNTER — Ambulatory Visit (HOSPITAL_COMMUNITY): Payer: BC Managed Care – PPO | Admitting: Occupational Therapy

## 2023-08-29 DIAGNOSIS — R278 Other lack of coordination: Secondary | ICD-10-CM

## 2023-08-29 DIAGNOSIS — R2681 Unsteadiness on feet: Secondary | ICD-10-CM

## 2023-08-29 DIAGNOSIS — R29818 Other symptoms and signs involving the nervous system: Secondary | ICD-10-CM | POA: Diagnosis not present

## 2023-08-29 DIAGNOSIS — M6281 Muscle weakness (generalized): Secondary | ICD-10-CM

## 2023-08-29 NOTE — Therapy (Signed)
[name] OUTPATIENT PHYSICAL THERAPY NEURO EVALUATION   Patient Name: [name] MRN: [id number] DOB:11-08-1970, 52 y.o., female Today's Date: 08/29/2023   PCP: Sharlene Dory, NPPCP - General  REFERRING PROVIDER: Gwenlyn Perking, MD   END OF SESSION:   PT End of Session - 08/29/23 0906     Visit Number 1    Number of Visits 10    Authorization Type BCBS Comm    Authorization Time Period auth requested 12/12; 18 visits total    Progress Note Due on Visit 10    PT Start Time 0930    PT Stop Time 1010    PT Time Calculation (min) 40 min    Activity Tolerance Patient tolerated treatment well    Behavior During Therapy WFL for tasks assessed/performed             Past Medical History:  Diagnosis Date   Anemia    Anxiety    Asthma    Diabetes mellitus without complication (HCC)    Fibroids    Hypertension    Sickle cell trait (HCC)    Past Surgical History:  Procedure Laterality Date   ABDOMINAL HYSTERECTOMY N/A 10/04/2015   Procedure: HYSTERECTOMY ABDOMINAL;  Surgeon: Tilda Burrow, MD;  Location: [facility];  Service: Gynecology;  Laterality: N/A;   APPENDECTOMY     BILATERAL SALPINGECTOMY Bilateral 10/04/2015   Procedure: BILATERAL SALPINGECTOMY;  Surgeon: Tilda Burrow, MD;  Location: [facility];  Service: Gynecology;  Laterality: Bilateral;   ORIF ANKLE FRACTURE Right 02/28/2023   Procedure: OPEN REDUCTION INTERNAL FIXATION (ORIF) ANKLE FRACTURE;  Surgeon: Oliver Barre, MD;  Location: [facility];  Service: Orthopedics;  Laterality: Right;   TUBAL LIGATION     Patient Active Problem List   Diagnosis Date Noted   Sickle cell trait (HCC) 04/24/2023   DM (diabetes mellitus) type 2, uncontrolled, with ketoacidosis (HCC) 10/25/2018   DKA (diabetic ketoacidosis) (HCC) 10/24/2018   Hypertension associated with diabetes (HCC) 10/24/2018   AKI (acute kidney injury) (HCC) 10/24/2018   Hypokalemia 10/24/2018   Bronchitis 10/24/2018   S/P total abdominal hysterectomy  10/04/2015   Excessive or frequent menstruation 07/20/2013   OBESITY 01/07/2008   Tobacco use 01/07/2008    ONSET DATE: Right Thalamic CVA 08/08/23   REFERRING DIAG:  Free Text Diagnosis  CVA    THERAPY DIAG:  Unsteadiness on feet  Muscle weakness (generalized)  Rationale for Evaluation and Treatment: Rehabilitation  SUBJECTIVE:  SUBJECTIVE STATEMENT: Patient s/p right sided thalamic stroke 08/08/23 . Patient was admitted for 1 day. Patient had MD f/u after hospital and was referred to OPPT for residual left sided weakness.   Pt accompanied by: self  PERTINENT HISTORY:  DM II HTN  Right ankle ORIF 2024  PAIN:  Are you having pain? Yes: NPRS scale: 2/10 Pain location: left abs Pain description: throb  Aggravating factors: stretching Relieving factors: n/a  PRECAUTIONS: None  RED FLAGS: None   WEIGHT BEARING RESTRICTIONS: No  FALLS: Has patient fallen in last 6 months? No  LIVING ENVIRONMENT: Lives with: lives with their spouse Lives in: House/apartment Stairs: Yes: External: 3 steps; can reach both Has following equipment at home: None  PLOF: Independent  PATIENT SURVEYS:  FOTO [phone]  PATIENT GOALS: to return to work   OBJECTIVE:   DIAGNOSTIC FINDINGS: n/a   COGNITION: Overall cognitive status: Within functional limits for tasks assessed     SENSATION: WFL   COORDINATION:   Finger to nose: right hand WFL; left hand decreased Heel to shin: right WFL; left decreased   Visual  Left peripheral vision decreased  H -test: WFL Moderate left-sided neglect   POSTURE: No Significant postural limitations   FUNCTIONAL TESTS:  5 times sit to stand: 16.92 with upper extremity assist pus off lower extremity; "plops down into chair"     GAIT ANALYSIS: Gait  pattern:  patient with wider BOS; Mod left sided neglect to environment  Distance walked: 72ft Assistive device utilized: None Level of assistance: Complete Independence Comments:    BED MOBILITY:  Sit to supine Complete Independence Supine to sit Complete Independence   LOWER EXTREMITY MMT:    MMT Right Eval Left Eval  Hip flexion 3+/5 3/5  Hip extension    Hip abduction    Hip adduction    Hip internal rotation    Hip external rotation    Knee flexion 3+/5 3/5  Knee extension 3+/5 3/5  Ankle dorsiflexion 3+/5 3+/5  Ankle plantarflexion    Ankle inversion    Ankle eversion    (Blank rows = not tested)    TODAY'S TREATMENT:                                                                                                                              DATE:   08/29/23  PT initial eval- low complexity  PATIENT EDUCATION: Education details: prognosis, activity modification  Person educated: Patient Education method: Explanation Education comprehension: verbalized understanding  HOME EXERCISE PROGRAM: TBD   GOALS: Goals reviewed with patient? No  SHORT TERM GOALS: Target date: 09/26/2023    Patient will be able to perform the 5TSTS(5 Times Sit to Stand) Test Sit within 16s seconds with minimal upper extremity  assistance from a standard height chair to improve ability to squat, lift objects negotiate stairs, and walk prolonged distances.  Baseline: Goal status: INITIAL     2.  Patient will be  independent with a basic stretching/strengthening HEP Baseline:  Goal status: INITIAL   LONG TERM GOALS: 10/24/2023    Patient will be able to perform the 5TSTS(5 Times Sit to Stand) Test Sit within 14 seconds with NO upper extremity  assistance from a standard height chair to improve ability to squat, lift objects negotiate stairs, and walk prolonged distances.  Baseline:  Goal status: INITIAL  2.    Patient will be able to score a >/=  70 on the FOTO to  demonstrate decreased difficulty in ADL completion Baseline:  Goal status: INITIAL  3.   Patient will be independent with a comprehensive strengthening HEP  Baseline:  Goal status: INITIAL  ASSESSMENT:  CLINICAL IMPRESSION: Patient is a 52 y.o. female who was seen today for physical therapy evaluation and treatment for s/p right sided thalamic CVA 08/08/23.   OBJECTIVE IMPAIRMENTS: Abnormal gait, decreased activity tolerance, decreased coordination, decreased endurance, difficulty walking, decreased strength, decreased safety awareness, and pain.   ACTIVITY LIMITATIONS: carrying, lifting, standing, squatting, sleeping, stairs, transfers, hygiene/grooming, and locomotion level  PARTICIPATION LIMITATIONS: meal prep, cleaning, laundry, personal finances, driving, shopping, community activity, occupation, and yard work  PERSONAL FACTORS:     are also affecting patient's functional outcome.   REHAB POTENTIAL: Fair    CLINICAL DECISION MAKING: Stable/uncomplicated  EVALUATION COMPLEXITY: Low  PLAN:  PT FREQUENCY: 1x/week  PT DURATION: 8 weeks  PLANNED INTERVENTIONS: 97110-Therapeutic exercises, 97530- Therapeutic activity, 97112- Neuromuscular re-education, 97535- Self Care, 09811- Manual therapy, [phone]- Gait training, Patient/Family education, Balance training, Joint mobilization, Joint manipulation, Spinal manipulation, Spinal mobilization, Vestibular training, Visual/preceptual remediation/compensation, Cognitive remediation, Cryotherapy, and Moist heat  PLAN FOR NEXT SESSION: Progress lower extremity strength, balance   MANAGED MEDICAID AUTHORIZATION PEDS  Visit Dx Codes: R26.81, M62.81  Choose one: Rehabilitative  Standardized Assessment:   Standardized Assessment Documents a Deficit at or below the 10th percentile (>1.5 standard deviations below normal for the patient's age)?   Please select the following statement that best describes the patient's presentation or goal  of treatment:   OT: Choose one:   SLP: Choose one:   Please rate overall deficits/functional limitations:   Check all possible CPT codes: 29562- Therapeutic Exercise, [phone]- Neuro Re-education, [phone] - Gait Training, [phone] - Manual Therapy, 97530 - Therapeutic Activities, and 97535 - Self Care    Check all conditions that are expected to impact treatment: Neurological condition and/or seizures   If treatment provided at initial evaluation, no treatment charged due to lack of authorization.      RE-EVALUATION ONLY: How many goals were set at initial evaluation? 5  How many have been met? 0  If zero (0) goals have been met:  What is the potential for progress towards established goals? N/A   Select the primary mitigating factor which limited progress: N/A   Seymour Bars, PT 08/29/2023, 9:27 AM

## 2023-08-29 NOTE — Therapy (Signed)
OUTPATIENT OCCUPATIONAL THERAPY NEURO TREATMENT  Patient Name: [name] MRN: [id number] DOB:01/08/1971, 52 y.o., female Today's Date: 08/29/2023    END OF SESSION:  OT End of Session - 08/29/23 0953     Visit Number 2    Number of Visits 6    Date for OT Re-Evaluation 10/01/23    Authorization Type BCBS    Authorization Time Period 18 visit limit; requesting 6 visits    Authorization - Visit Number 2    Authorization - Number of Visits 18    OT Start Time 0850    OT Stop Time 0930    OT Time Calculation (min) 40 min    Activity Tolerance Patient tolerated treatment well    Behavior During Therapy WFL for tasks assessed/performed              Past Medical History:  Diagnosis Date   Anemia    Anxiety    Asthma    Diabetes mellitus without complication (HCC)    Fibroids    Hypertension    Sickle cell trait (HCC)    Past Surgical History:  Procedure Laterality Date   ABDOMINAL HYSTERECTOMY N/A 10/04/2015   Procedure: HYSTERECTOMY ABDOMINAL;  Surgeon: Tilda Burrow, MD;  Location: [facility];  Service: Gynecology;  Laterality: N/A;   APPENDECTOMY     BILATERAL SALPINGECTOMY Bilateral 10/04/2015   Procedure: BILATERAL SALPINGECTOMY;  Surgeon: Tilda Burrow, MD;  Location: [facility];  Service: Gynecology;  Laterality: Bilateral;   ORIF ANKLE FRACTURE Right 02/28/2023   Procedure: OPEN REDUCTION INTERNAL FIXATION (ORIF) ANKLE FRACTURE;  Surgeon: Oliver Barre, MD;  Location: [facility];  Service: Orthopedics;  Laterality: Right;   TUBAL LIGATION     Patient Active Problem List   Diagnosis Date Noted   Sickle cell trait (HCC) 04/24/2023   DM (diabetes mellitus) type 2, uncontrolled, with ketoacidosis (HCC) 10/25/2018   DKA (diabetic ketoacidosis) (HCC) 10/24/2018   Hypertension associated with diabetes (HCC) 10/24/2018   AKI (acute kidney injury) (HCC) 10/24/2018   Hypokalemia 10/24/2018   Bronchitis 10/24/2018   S/P total abdominal hysterectomy 10/04/2015    Excessive or frequent menstruation 07/20/2013   OBESITY 01/07/2008   Tobacco use 01/07/2008   PCP: Sharlene Dory, NP REFERRING PROVIDER: Dr. Trevor Mace  ONSET DATE: 08/08/23  REFERRING DIAG: s/p left CVA  THERAPY DIAG:  Other lack of coordination  Other symptoms and signs involving the nervous system  Rationale for Evaluation and Treatment: Rehabilitation  SUBJECTIVE:   SUBJECTIVE STATEMENT: "After I exercises I can feel myself tightening back up."  Pt accompanied by: self  PERTINENT HISTORY: Pt is a 52 y/o female s/p left CVA on 08/08/23. Pt reports symptoms were tingling/numbness in LUE and LLE, developed some weakness as well.   PRECAUTIONS: None  WEIGHT BEARING RESTRICTIONS: No  PAIN:  Are you having pain? No  FALLS: Has patient fallen in last 6 months? No  PLOF: Independent  PATIENT GOALS: To be able to use the left hand.   OBJECTIVE:  Note: Objective measures were completed at Evaluation unless otherwise noted.  HAND DOMINANCE: Right  ADLs: Overall ADLs: Pt is having difficulty with holding cups-drops or holds too tight. Pt reports difficulty with fixing hair, tying shoes. Pt typically sleeps on stomach, reports unable to lay on stomach due to discomfort in her left side. Pt reports feeling overall weaker on the left side.    FUNCTIONAL OUTCOME MEASURES: Quick Dash: 45.45  UPPER EXTREMITY ROM:  ROM is WFL throughout LUE  UPPER EXTREMITY MMT:     MMT Left eval  Shoulder flexion 5/  Shoulder abduction 5/5  Shoulder internal rotation 5/5  Shoulder external rotation 5/5  Elbow flexion 5/5  Elbow extension 4+/5  Wrist flexion 4/5  Wrist extension 5/5  Wrist ulnar deviation 4/5  Wrist radial deviation 4/5  Wrist pronation 4+/5  Wrist supination 5/5  (Blank rows = not tested)  HAND FUNCTION: Grip strength: Right: 50 lbs; Left: 30 lbs, Lateral pinch: Right: 16 lbs, Left: 12 lbs, and 3 point pinch: Right: 10 lbs, Left: 10  lbs  COORDINATION: 9 Hole Peg test: Right: 29.88 sec; Left: 55.02 sec  SENSATION: Mild tingling  EDEMA: None  COGNITION: Overall cognitive status: Within functional limits for tasks assessed  VISION: Subjective report: No change Baseline vision: No visual deficits Visual history:  None   TODAY'S TREATMENT:                                                                                                                              DATE:   08/29/23 -Shoulder Strengthening: 4#, flexion, abduction, protraction, horizontal abduction, er/IR, x10 -Elbow strengthening: 4#, bicep curls, hammer curls, tricep kick back, x10 -Wrist Strengthening: 2#, flexion, extension, ulnar/radial deviation, supination/pronation, x10 -Shoulder ABC's in the air: green weighted ball -Wrist ABC's in the air: green weighted ball -Gripper: 18# 10 medium beads, 23# 6 medium beads -Pinch tree: red, green, blue resistance clips, tripod pinch up, lateral pinch down, 7 of each color   PATIENT EDUCATION: Education details: Shoulder strengthening Person educated: Patient Education method: Explanation, Demonstration, and Handouts Education comprehension: verbalized understanding and returned demonstration  HOME EXERCISE PROGRAM: Eval: wrist strengthening, theraputty grip and pinch strengthening 12/12: Shoulder Strengthening   GOALS: Goals reviewed with patient? Yes  SHORT TERM GOALS: Target date: 09/10/23  Pt will be provided with and educated on HEP to improve LUE strength required for use during ADLs.   Goal status: IN PROGRESS   LONG TERM GOALS: Target date: 10/01/23  Pt will increase LUE strength to 5/5 throughout to improve ability to lift and carry objects using LUE as non-dominant.   Goal status: IN PROGRESS  2.  Pt will increase left grip strength by 10# and pinch strength by 2# to improve ability to grasp and hold objects when completing housekeeping or meal preparation.   Goal status: IN  PROGRESS  3.  Pt will improve left hand coordination required for tying shoes, fixing hair, by completing 9 hole peg test in under 40".   Goal status: IN PROGRESS    ASSESSMENT:  CLINICAL IMPRESSION: This session pt working on her overall strength and functional mobility with control. She was able to tolerate 4# dumbbells, requiring 2 rest breaks due to fatigue in the shoulder. With wrist strengthening she tolerated 2# well and will likely do well to progress to 3# next session. OT also started pt on gripping, where she was able  to consecutively grip 18# and 23# with rest breaks in between. Verbal and tactile cuing provided for positioning and technique.   PERFORMANCE DEFICITS: in functional skills including ADLs, IADLs, coordination, strength, and UE functional use   PLAN:  OT FREQUENCY: 1x/week  OT DURATION: 6 weeks  PLANNED INTERVENTIONS: 97168 OT Re-evaluation, 97535 self care/ADL training, 40981 therapeutic exercise, 97530 therapeutic activity, 97112 neuromuscular re-education, 97032 electrical stimulation (manual), 97014 electrical stimulation unattended, patient/family education, and DME and/or AE instructions  RECOMMENDED OTHER SERVICES: None   CONSULTED AND AGREED WITH PLAN OF CARE: Patient  PLAN FOR NEXT SESSION: Follow up on HEP, begin LUE strengthening and coordination work   [employer], OTR/L  [phone] 08/29/2023, 9:59 AM

## 2023-08-29 NOTE — Telephone Encounter (Signed)
LMOVM for patient to call the office back.   Per Dr. Everlena Cooper, It depends on recommendations for PT/OT.  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

## 2023-09-03 NOTE — Telephone Encounter (Signed)
Pt called in returning Sheena's call 

## 2023-09-04 ENCOUNTER — Ambulatory Visit (HOSPITAL_COMMUNITY): Payer: BC Managed Care – PPO | Admitting: Occupational Therapy

## 2023-09-04 ENCOUNTER — Ambulatory Visit (HOSPITAL_COMMUNITY): Payer: BC Managed Care – PPO

## 2023-09-04 ENCOUNTER — Telehealth: Payer: Self-pay | Admitting: Neurology

## 2023-09-04 ENCOUNTER — Encounter (HOSPITAL_COMMUNITY): Payer: Self-pay | Admitting: Occupational Therapy

## 2023-09-04 ENCOUNTER — Encounter (HOSPITAL_COMMUNITY): Payer: Self-pay

## 2023-09-04 DIAGNOSIS — R2681 Unsteadiness on feet: Secondary | ICD-10-CM

## 2023-09-04 DIAGNOSIS — R278 Other lack of coordination: Secondary | ICD-10-CM

## 2023-09-04 DIAGNOSIS — R29818 Other symptoms and signs involving the nervous system: Secondary | ICD-10-CM | POA: Diagnosis not present

## 2023-09-04 DIAGNOSIS — M6281 Muscle weakness (generalized): Secondary | ICD-10-CM

## 2023-09-04 NOTE — Telephone Encounter (Signed)
Patient needs a call back from sheena.

## 2023-09-04 NOTE — Therapy (Signed)
OUTPATIENT OCCUPATIONAL THERAPY NEURO TREATMENT  Patient Name: [name] MRN: [id number] DOB:02-03-1971, 52 y.o., female Today's Date: 09/04/2023    END OF SESSION:  OT End of Session - 09/04/23 1134     Visit Number 3    Number of Visits 6    Date for OT Re-Evaluation 10/01/23    Authorization Type BCBS    Authorization Time Period 18 visit limit    Authorization - Visit Number 3    Authorization - Number of Visits 18    OT Start Time 1055    OT Stop Time 1135    OT Time Calculation (min) 40 min    Activity Tolerance Patient tolerated treatment well    Behavior During Therapy WFL for tasks assessed/performed               Past Medical History:  Diagnosis Date   Anemia    Anxiety    Asthma    Diabetes mellitus without complication (HCC)    Fibroids    Hypertension    Sickle cell trait (HCC)    Past Surgical History:  Procedure Laterality Date   ABDOMINAL HYSTERECTOMY N/A 10/04/2015   Procedure: HYSTERECTOMY ABDOMINAL;  Surgeon: Tilda Burrow, MD;  Location: [facility];  Service: Gynecology;  Laterality: N/A;   APPENDECTOMY     BILATERAL SALPINGECTOMY Bilateral 10/04/2015   Procedure: BILATERAL SALPINGECTOMY;  Surgeon: Tilda Burrow, MD;  Location: [facility];  Service: Gynecology;  Laterality: Bilateral;   ORIF ANKLE FRACTURE Right 02/28/2023   Procedure: OPEN REDUCTION INTERNAL FIXATION (ORIF) ANKLE FRACTURE;  Surgeon: Oliver Barre, MD;  Location: [facility];  Service: Orthopedics;  Laterality: Right;   TUBAL LIGATION     Patient Active Problem List   Diagnosis Date Noted   Sickle cell trait (HCC) 04/24/2023   DM (diabetes mellitus) type 2, uncontrolled, with ketoacidosis (HCC) 10/25/2018   DKA (diabetic ketoacidosis) (HCC) 10/24/2018   Hypertension associated with diabetes (HCC) 10/24/2018   AKI (acute kidney injury) (HCC) 10/24/2018   Hypokalemia 10/24/2018   Bronchitis 10/24/2018   S/P total abdominal hysterectomy 10/04/2015   Excessive or frequent  menstruation 07/20/2013   OBESITY 01/07/2008   Tobacco use 01/07/2008   PCP: Sharlene Dory, NP REFERRING PROVIDER: Dr. Trevor Mace  ONSET DATE: 08/08/23  REFERRING DIAG: s/p left CVA  THERAPY DIAG:  Other symptoms and signs involving the nervous system  Other lack of coordination  Rationale for Evaluation and Treatment: Rehabilitation  SUBJECTIVE:   SUBJECTIVE STATEMENT: S: "It's still a little tightness in the arm."   PERTINENT HISTORY: Pt is a 52 y/o female s/p left CVA on 08/08/23. Pt reports symptoms were tingling/numbness in LUE and LLE, developed some weakness as well.   PRECAUTIONS: None  WEIGHT BEARING RESTRICTIONS: No  PAIN:  Are you having pain? No  FALLS: Has patient fallen in last 6 months? No  PLOF: Independent  PATIENT GOALS: To be able to use the left hand.   OBJECTIVE:  Note: Objective measures were completed at Evaluation unless otherwise noted.  HAND DOMINANCE: Right  ADLs: Overall ADLs: Pt is having difficulty with holding cups-drops or holds too tight. Pt reports difficulty with fixing hair, tying shoes. Pt typically sleeps on stomach, reports unable to lay on stomach due to discomfort in her left side. Pt reports feeling overall weaker on the left side.    FUNCTIONAL OUTCOME MEASURES: Quick Dash: 45.45  UPPER EXTREMITY ROM:      ROM is WFL throughout LUE  UPPER  EXTREMITY MMT:     MMT Left eval  Shoulder flexion 5/  Shoulder abduction 5/5  Shoulder internal rotation 5/5  Shoulder external rotation 5/5  Elbow flexion 5/5  Elbow extension 4+/5  Wrist flexion 4/5  Wrist extension 5/5  Wrist ulnar deviation 4/5  Wrist radial deviation 4/5  Wrist pronation 4+/5  Wrist supination 5/5  (Blank rows = not tested)  HAND FUNCTION: Grip strength: Right: 50 lbs; Left: 30 lbs, Lateral pinch: Right: 16 lbs, Left: 12 lbs, and 3 point pinch: Right: 10 lbs, Left: 10 lbs  COORDINATION: 9 Hole Peg test: Right: 29.88 sec; Left: 55.02  sec  SENSATION: Mild tingling   TODAY'S TREATMENT:                                                                                                                              DATE:  09/04/23 -Shoulder strengthening: green theraband-protraction, flexion, abduction, er, IR, horizontal abduction, 10 reps -Scapular strengthening: green theraband-row, extension, retraction, 10 reps -ABC Writing: shoulder at 90 degrees flexion, 2# weight -Grip strengthening: large beads at 35# with horizontal gripper, medium beads at 35# with gripper vertical -Coin manipulation: pt holding coins in left hand, working on in-hand manipulation to translate to fingertips and place into slotted container. Intermittently dropping coins, completed 3 rounds -Pinch task: pt using green clothespin and 3 point pinch to grasp and stack 2 towers of 5 sponges   08/29/23 -Shoulder Strengthening: 4#, flexion, abduction, protraction, horizontal abduction, er/IR, x10 -Elbow strengthening: 4#, bicep curls, hammer curls, tricep kick back, x10 -Wrist Strengthening: 2#, flexion, extension, ulnar/radial deviation, supination/pronation, x10 -Shoulder ABC's in the air: green weighted ball -Wrist ABC's in the air: green weighted ball -Gripper: 18# 10 medium beads, 23# 6 medium beads -Pinch tree: red, green, blue resistance clips, tripod pinch up, lateral pinch down, 7 of each color   PATIENT EDUCATION: Education details: reviewed HEP Person educated: Patient Education method: Programmer, multimedia, Demonstration, and Handouts Education comprehension: verbalized understanding and returned demonstration  HOME EXERCISE PROGRAM: Eval: wrist strengthening, theraputty grip and pinch strengthening 12/12: Shoulder Strengthening   GOALS: Goals reviewed with patient? Yes  SHORT TERM GOALS: Target date: 09/10/23  Pt will be provided with and educated on HEP to improve LUE strength required for use during ADLs.   Goal status: IN  PROGRESS   LONG TERM GOALS: Target date: 10/01/23  Pt will increase LUE strength to 5/5 throughout to improve ability to lift and carry objects using LUE as non-dominant.   Goal status: IN PROGRESS  2.  Pt will increase left grip strength by 10# and pinch strength by 2# to improve ability to grasp and hold objects when completing housekeeping or meal preparation.   Goal status: IN PROGRESS  3.  Pt will improve left hand coordination required for tying shoes, fixing hair, by completing 9 hole peg test in under 40".   Goal status: IN PROGRESS    ASSESSMENT:  CLINICAL IMPRESSION: Pt  reports she is working hard on her HEP. Introduced resistance exercises today using green theraband, also added scapular strengthening. Pt increased hand gripper resistance to 35# for large and small beads today. Completed novel coordination task with coin manipulation, mod difficulty maintaining grip on coins in palm.   PERFORMANCE DEFICITS: in functional skills including ADLs, IADLs, coordination, strength, and UE functional use   PLAN:  OT FREQUENCY: 1x/week  OT DURATION: 6 weeks  PLANNED INTERVENTIONS: 97168 OT Re-evaluation, 97535 self care/ADL training, 65784 therapeutic exercise, 97530 therapeutic activity, 97112 neuromuscular re-education, 97032 electrical stimulation (manual), 97014 electrical stimulation unattended, patient/family education, and DME and/or AE instructions  RECOMMENDED OTHER SERVICES: None   CONSULTED AND AGREED WITH PLAN OF CARE: Patient  PLAN FOR NEXT SESSION: Follow up on HEP, continue LUE strengthening and coordination work   [employer], OTR/L  [phone] 09/04/2023, 11:59 AM

## 2023-09-04 NOTE — Therapy (Signed)
[name] OUTPATIENT PHYSICAL THERAPY NEURO EVALUATION   Patient Name: [name] MRN: [id number] DOB:[birth year], 52 y.o., female Today's Date: 09/04/2023   PCP: Sharlene Dory, NPPCP - General  REFERRING PROVIDER: Gwenlyn Perking, MD   END OF SESSION:   PT End of Session - 09/04/23 1014     Visit Number 2    Number of Visits 10    Authorization Type [employer] Comm    Authorization Time Period auth requested 12/12; 18 visits total    Progress Note Due on Visit 10    PT Start Time 1017    PT Stop Time 1055    PT Time Calculation (min) 38 min    Equipment Utilized During Treatment Gait belt    Activity Tolerance Patient tolerated treatment well    Behavior During Therapy WFL for tasks assessed/performed             Past Medical History:  Diagnosis Date   Anemia    Anxiety    Asthma    Diabetes mellitus without complication (HCC)    Fibroids    Hypertension    Sickle cell trait (HCC)    Past Surgical History:  Procedure Laterality Date   ABDOMINAL HYSTERECTOMY N/A 10/04/2015   Procedure: HYSTERECTOMY ABDOMINAL;  Surgeon: Tilda Burrow, MD;  Location: [facility];  Service: Gynecology;  Laterality: N/A;   APPENDECTOMY     BILATERAL SALPINGECTOMY Bilateral 10/04/2015   Procedure: BILATERAL SALPINGECTOMY;  Surgeon: Tilda Burrow, MD;  Location: [facility];  Service: Gynecology;  Laterality: Bilateral;   ORIF ANKLE FRACTURE Right 02/28/2023   Procedure: OPEN REDUCTION INTERNAL FIXATION (ORIF) ANKLE FRACTURE;  Surgeon: Oliver Barre, MD;  Location: [facility];  Service: Orthopedics;  Laterality: Right;   TUBAL LIGATION     Patient Active Problem List   Diagnosis Date Noted   Sickle cell trait (HCC) 04/24/2023   DM (diabetes mellitus) type 2, uncontrolled, with ketoacidosis (HCC) 10/25/2018   DKA (diabetic ketoacidosis) (HCC) 10/24/2018   Hypertension associated with diabetes (HCC) 10/24/2018   AKI (acute kidney injury) (HCC) 10/24/2018   Hypokalemia 10/24/2018   Bronchitis  10/24/2018   S/P total abdominal hysterectomy 10/04/2015   Excessive or frequent menstruation 07/20/2013   OBESITY 01/07/2008   Tobacco use 01/07/2008    ONSET DATE: Right Thalamic CVA 08/08/23   REFERRING DIAG:  Free Text Diagnosis  CVA    THERAPY DIAG:  Unsteadiness on feet  Muscle weakness (generalized)  Rationale for Evaluation and Treatment: Rehabilitation  SUBJECTIVE:  SUBJECTIVE STATEMENT: 09/04/23:  Reports she is feeling good today, no reports of pain or recent issue.  Has began exercises and reports improvements with awareness Lt side. Reports she is trying to get apt with eye MD.    Patient s/p right sided thalamic stroke 08/08/23 . Patient was admitted for 1 day. Patient had MD f/u after hospital and was referred to OPPT for residual left sided weakness.   Pt accompanied by: self  PERTINENT HISTORY:  DM II HTN  Right ankle ORIF 2024  PAIN:  Are you having pain? Yes: NPRS scale: 2/10 Pain location: left abs Pain description: throb  Aggravating factors: stretching Relieving factors: n/a  PRECAUTIONS: None  RED FLAGS: None   WEIGHT BEARING RESTRICTIONS: No  FALLS: Has patient fallen in last 6 months? No  LIVING ENVIRONMENT: Lives with: lives with their spouse Lives in: House/apartment Stairs: Yes: External: 3 steps; can reach both Has following equipment at home: None  PLOF: Independent  PATIENT SURVEYS:  FOTO [phone]  PATIENT GOALS: to return to work   OBJECTIVE:   DIAGNOSTIC FINDINGS: n/a   COGNITION: Overall cognitive status: Within functional limits for tasks assessed     SENSATION: WFL   COORDINATION:   Finger to nose: right hand WFL; left hand decreased Heel to shin: right WFL; left decreased   Visual  Left peripheral vision  decreased  H -test: WFL Moderate left-sided neglect   POSTURE: No Significant postural limitations   FUNCTIONAL TESTS:  5 times sit to stand: 16.92 with upper extremity assist pus off lower extremity; "plops down into chair"     GAIT ANALYSIS: Gait pattern:  patient with wider BOS; Mod left sided neglect to environment  Distance walked: 66ft Assistive device utilized: None Level of assistance: Complete Independence Comments:    BED MOBILITY:  Sit to supine Complete Independence Supine to sit Complete Independence   LOWER EXTREMITY MMT:    MMT Right Eval Left Eval Right 09/04/23 Left 09/04/23  Hip flexion 3+/5 3/5    Hip extension   4 4  Hip abduction   4 4-  Hip adduction      Hip internal rotation      Hip external rotation      Knee flexion 3+/5 3/5    Knee extension 3+/5 3/5    Ankle dorsiflexion 3+/5 3+/5    Ankle plantarflexion      Ankle inversion      Ankle eversion      (Blank rows = not tested)    TODAY'S TREATMENT:                                                                                                                              DATE:  09/04/23:  Reviewed goals Educated importance of HEP compliance for maximal benefits MMT for gluts  STS no HHA eccentric control  Heel raises 10x  Toe raises 10x  Squat front of chair 10x Toe tapping 6in  step height opposite UE flexion paired with LE 20x Tandem stance 2x 30" Tandem stance on foam 2x 30" SLS Lt 8", Rt 11" Throw/catch scarf with opposite UE forward/ backward 2RT down hallway Sidestep with GTB 2RT  08/29/23  PT initial eval- low complexity  PATIENT EDUCATION: Education details: prognosis, activity modification  Person educated: Patient Education method: Explanation Education comprehension: verbalized understanding  HOME EXERCISE PROGRAM: TBD   GOALS: Goals reviewed with patient? No  SHORT TERM GOALS: Target date: 09/26/2023    Patient will be able to perform the 5TSTS(5  Times Sit to Stand) Test Sit within 16s seconds with minimal upper extremity  assistance from a standard height chair to improve ability to squat, lift objects negotiate stairs, and walk prolonged distances.  Baseline: Goal status: INITIAL     2.  Patient will be independent with a basic stretching/strengthening HEP Baseline:  Goal status: INITIAL   LONG TERM GOALS: 10/24/2023    Patient will be able to perform the 5TSTS(5 Times Sit to Stand) Test Sit within 14 seconds with NO upper extremity  assistance from a standard height chair to improve ability to squat, lift objects negotiate stairs, and walk prolonged distances.  Baseline:  Goal status: INITIAL  2.    Patient will be able to score a >/=  70 on the FOTO to demonstrate decreased difficulty in ADL completion Baseline:  Goal status: INITIAL  3.   Patient will be independent with a comprehensive strengthening HEP  Baseline:  Goal status: INITIAL  ASSESSMENT:  CLINICAL IMPRESSION: 09/04/23:  Reviewed goals and educated importance of HEP compliance for maximal benefits.  HEP was established this session with focus on LE strengthening and balance training.  Pt tolerated well to all exercises with no reports of pain or fatigue.  Noted increased difficulty on dynamic surface required intermittent HHA and cueing to improve focal point, noted ankle instability as well.  Pt given HEP printout and GTB to use during balance activities.  Minimal Lt side deficits noted.    Patient is a 52 y.o. female who was seen today for physical therapy evaluation and treatment for s/p right sided thalamic CVA 08/08/23.   OBJECTIVE IMPAIRMENTS: Abnormal gait, decreased activity tolerance, decreased coordination, decreased endurance, difficulty walking, decreased strength, decreased safety awareness, and pain.   ACTIVITY LIMITATIONS: carrying, lifting, standing, squatting, sleeping, stairs, transfers, hygiene/grooming, and locomotion  level  PARTICIPATION LIMITATIONS: meal prep, cleaning, laundry, personal finances, driving, shopping, community activity, occupation, and yard work  PERSONAL FACTORS:     are also affecting patient's functional outcome.   REHAB POTENTIAL: Fair    CLINICAL DECISION MAKING: Stable/uncomplicated  EVALUATION COMPLEXITY: Low  PLAN:  PT FREQUENCY: 1x/week  PT DURATION: 8 weeks  PLANNED INTERVENTIONS: 97110-Therapeutic exercises, 97530- Therapeutic activity, 97112- Neuromuscular re-education, 97535- Self Care, 17616- Manual therapy, [id number]- Gait training, Patient/Family education, Balance training, Joint mobilization, Joint manipulation, Spinal manipulation, Spinal mobilization, Vestibular training, Visual/preceptual remediation/compensation, Cognitive remediation, Cryotherapy, and Moist heat  PLAN FOR NEXT SESSION: Progress lower extremity strength, balance.  Increase difficulty next session.  [place], head turns, vector stance, dynamic surface.  Becky Sax, LPTA/CLT; CBIS [phone]  Juel Burrow, PTA 09/04/2023, 11:12 AM

## 2023-09-05 NOTE — Telephone Encounter (Signed)
Pt needs a letter for work stating what her restrictions are so she can go back to work  please call

## 2023-09-05 NOTE — Telephone Encounter (Signed)
Pt called in stating she spoke with [facility] yesterday and is waiting on Sheena to call her back

## 2023-09-09 ENCOUNTER — Encounter: Payer: Self-pay | Admitting: Neurology

## 2023-09-09 NOTE — Telephone Encounter (Signed)
Tried calling patient, no answer. VM full.

## 2023-09-13 ENCOUNTER — Encounter (HOSPITAL_COMMUNITY): Payer: Self-pay | Admitting: Occupational Therapy

## 2023-09-13 ENCOUNTER — Ambulatory Visit (HOSPITAL_COMMUNITY): Payer: BC Managed Care – PPO

## 2023-09-13 ENCOUNTER — Ambulatory Visit (HOSPITAL_COMMUNITY): Payer: BC Managed Care – PPO | Admitting: Occupational Therapy

## 2023-09-13 DIAGNOSIS — M6281 Muscle weakness (generalized): Secondary | ICD-10-CM

## 2023-09-13 DIAGNOSIS — R29818 Other symptoms and signs involving the nervous system: Secondary | ICD-10-CM | POA: Diagnosis not present

## 2023-09-13 DIAGNOSIS — R278 Other lack of coordination: Secondary | ICD-10-CM

## 2023-09-13 DIAGNOSIS — R2681 Unsteadiness on feet: Secondary | ICD-10-CM

## 2023-09-13 NOTE — Therapy (Signed)
OUTPATIENT OCCUPATIONAL THERAPY NEURO TREATMENT  Patient Name: [name] MRN: [id number] DOB:1970/11/04, 52 y.o., female Today's Date: 09/13/2023    END OF SESSION:  OT End of Session - 09/13/23 1044     Visit Number 4    Number of Visits 6    Date for OT Re-Evaluation 10/01/23    Authorization Type [employer]    Authorization Time Period 18 visit limit    Authorization - Visit Number 4    Authorization - Number of Visits 18    OT Start Time 0930    OT Stop Time 1015    OT Time Calculation (min) 45 min    Activity Tolerance Patient tolerated treatment well    Behavior During Therapy WFL for tasks assessed/performed                Past Medical History:  Diagnosis Date   Anemia    Anxiety    Asthma    Diabetes mellitus without complication (HCC)    Fibroids    Hypertension    Sickle cell trait (HCC)    Past Surgical History:  Procedure Laterality Date   ABDOMINAL HYSTERECTOMY N/A 10/04/2015   Procedure: HYSTERECTOMY ABDOMINAL;  Surgeon: Tilda Burrow, MD;  Location: [facility];  Service: Gynecology;  Laterality: N/A;   APPENDECTOMY     BILATERAL SALPINGECTOMY Bilateral 10/04/2015   Procedure: BILATERAL SALPINGECTOMY;  Surgeon: Tilda Burrow, MD;  Location: [facility];  Service: Gynecology;  Laterality: Bilateral;   ORIF ANKLE FRACTURE Right 02/28/2023   Procedure: OPEN REDUCTION INTERNAL FIXATION (ORIF) ANKLE FRACTURE;  Surgeon: Oliver Barre, MD;  Location: [facility];  Service: Orthopedics;  Laterality: Right;   TUBAL LIGATION     Patient Active Problem List   Diagnosis Date Noted   Sickle cell trait (HCC) 04/24/2023   DM (diabetes mellitus) type 2, uncontrolled, with ketoacidosis (HCC) 10/25/2018   DKA (diabetic ketoacidosis) (HCC) 10/24/2018   Hypertension associated with diabetes (HCC) 10/24/2018   AKI (acute kidney injury) (HCC) 10/24/2018   Hypokalemia 10/24/2018   Bronchitis 10/24/2018   S/P total abdominal hysterectomy 10/04/2015   Excessive or frequent  menstruation 07/20/2013   OBESITY 01/07/2008   Tobacco use 01/07/2008   PCP: Sharlene Dory, NP REFERRING PROVIDER: Dr. Trevor Mace  ONSET DATE: 08/08/23  REFERRING DIAG: s/p left CVA  THERAPY DIAG:  Other symptoms and signs involving the nervous system  Other lack of coordination  Rationale for Evaluation and Treatment: Rehabilitation  SUBJECTIVE:   SUBJECTIVE STATEMENT: S: "I'm still tight in the mornings when I wake up."   PERTINENT HISTORY: Pt is a 52 y/o female s/p left CVA on 08/08/23. Pt reports symptoms were tingling/numbness in LUE and LLE, developed some weakness as well.   PRECAUTIONS: None  WEIGHT BEARING RESTRICTIONS: No  PAIN:  Are you having pain? No  FALLS: Has patient fallen in last 6 months? No  PLOF: Independent  PATIENT GOALS: To be able to use the left hand.   OBJECTIVE:  Note: Objective measures were completed at Evaluation unless otherwise noted.  HAND DOMINANCE: Right  ADLs: Overall ADLs: Pt is having difficulty with holding cups-drops or holds too tight. Pt reports difficulty with fixing hair, tying shoes. Pt typically sleeps on stomach, reports unable to lay on stomach due to discomfort in her left side. Pt reports feeling overall weaker on the left side.    FUNCTIONAL OUTCOME MEASURES: Quick Dash: 45.45  UPPER EXTREMITY ROM:      ROM is WFL throughout  LUE  UPPER EXTREMITY MMT:     MMT Left eval  Shoulder flexion 5/5  Shoulder abduction 5/5  Shoulder internal rotation 5/5  Shoulder external rotation 5/5  Elbow flexion 5/5  Elbow extension 4+/5  Wrist flexion 4/5  Wrist extension 5/5  Wrist ulnar deviation 4/5  Wrist radial deviation 4/5  Wrist pronation 4+/5  Wrist supination 5/5  (Blank rows = not tested)  HAND FUNCTION: Grip strength: Right: 50 lbs; Left: 30 lbs, Lateral pinch: Right: 16 lbs, Left: 12 lbs, and 3 point pinch: Right: 10 lbs, Left: 10 lbs  COORDINATION: 9 Hole Peg test: Right: 29.88 sec; Left:  55.02 sec  SENSATION: Mild tingling   TODAY'S TREATMENT:                                                                                                                              DATE:   09/13/23 -Shoulder strengthening: green theraband-protraction, flexion, abduction, er, IR, horizontal abduction, 10 reps -Job simulation: removing linen bag, dust mopping, wiping down tables, pt performing without difficulty, using RUE as dominant and guiding with LUE as needed -Grip strengthening: large beads at 42# with horizontal gripper, medium beads at 42# with gripper vertical -Therapy ball strengthening: standing, green ball-chest press, overhead press, flexion, circles each direction, 10 reps each -Grooved pegboard: pt attempting to use tweezers to place pegs into pegboard, max difficulty so downgraded to fingers. Pt holding coins in palm, working on in-hand manipulation and dexterity to move to fingertips and place into pegboard. Increased time, occasionally dropping pegs.   09/04/23 -Shoulder strengthening: green theraband-protraction, flexion, abduction, er, IR, horizontal abduction, 10 reps -Scapular strengthening: green theraband-row, extension, retraction, 10 reps -ABC Writing: shoulder at 90 degrees flexion, 2# weight -Grip strengthening: large beads at 35# with horizontal gripper, medium beads at 35# with gripper vertical -Coin manipulation: pt holding coins in left hand, working on in-hand manipulation to translate to fingertips and place into slotted container. Intermittently dropping coins, completed 3 rounds -Pinch task: pt using green clothespin and 3 point pinch to grasp and stack 2 towers of 5 sponges   08/29/23 -Shoulder Strengthening: 4#, flexion, abduction, protraction, horizontal abduction, er/IR, x10 -Elbow strengthening: 4#, bicep curls, hammer curls, tricep kick back, x10 -Wrist Strengthening: 2#, flexion, extension, ulnar/radial deviation, supination/pronation,  x10 -Shoulder ABC's in the air: green weighted ball -Wrist ABC's in the air: green weighted ball -Gripper: 18# 10 medium beads, 23# 6 medium beads -Pinch tree: red, green, blue resistance clips, tripod pinch up, lateral pinch down, 7 of each color   PATIENT EDUCATION: Education details: green theraband strengthening Person educated: Patient Education method: Explanation, Demonstration, and Handouts Education comprehension: verbalized understanding and returned demonstration  HOME EXERCISE PROGRAM: Eval: wrist strengthening, theraputty grip and pinch strengthening 12/12: Shoulder Strengthening 12/27: green theraband strengthening   GOALS: Goals reviewed with patient? Yes  SHORT TERM GOALS: Target date: 09/10/23  Pt will be provided with and educated on HEP to improve  LUE strength required for use during ADLs.   Goal status: IN PROGRESS   LONG TERM GOALS: Target date: 10/01/23  Pt will increase LUE strength to 5/5 throughout to improve ability to lift and carry objects using LUE as non-dominant.   Goal status: IN PROGRESS  2.  Pt will increase left grip strength by 10# and pinch strength by 2# to improve ability to grasp and hold objects when completing housekeeping or meal preparation.   Goal status: IN PROGRESS  3.  Pt will improve left hand coordination required for tying shoes, fixing hair, by completing 9 hole peg test in under 40".   Goal status: IN PROGRESS    ASSESSMENT:  CLINICAL IMPRESSION: Pt reports she is feeling good, the cool sensation in her LUE and LLE is improving. Continued with LUE strengthening and stability. Pt completing job simulation tasks without difficulty, using LUE as non-dominant, good motor planning throughout activities. Increased hand gripper resistance to 42# today. Added therapy ball strengthening and grooved pegboard tasks today. Verbal cuing for form and technique.   PERFORMANCE DEFICITS: in functional skills including ADLs, IADLs,  coordination, strength, and UE functional use   PLAN:  OT FREQUENCY: 1x/week  OT DURATION: 6 weeks  PLANNED INTERVENTIONS: 97168 OT Re-evaluation, 97535 self care/ADL training, 96045 therapeutic exercise, 97530 therapeutic activity, 97112 neuromuscular re-education, 97032 electrical stimulation (manual), 97014 electrical stimulation unattended, patient/family education, and DME and/or AE instructions  RECOMMENDED OTHER SERVICES: None   CONSULTED AND AGREED WITH PLAN OF CARE: Patient  PLAN FOR NEXT SESSION: Follow up on HEP, continue LUE strengthening and coordination work   [employer], OTR/L  [phone] 09/13/2023, 10:44 AM

## 2023-09-13 NOTE — Patient Instructions (Signed)

## 2023-09-13 NOTE — Telephone Encounter (Signed)
Letter ready at the front desk.

## 2023-09-13 NOTE — Therapy (Addendum)
[name] OUTPATIENT PHYSICAL THERAPY NEURO TREATMENT/ PROGRESS NOTE/DISCHARGE  PHYSICAL THERAPY DISCHARGE SUMMARY  Visits from Start of Care: 3  Current functional level related to goals / functional outcomes: See below   Remaining deficits: See below   Education / Equipment: HEP   Patient agrees to discharge. Patient goals were met. Patient is being discharged due to meeting the stated rehab goals.     Patient Name: [name] MRN: [id number] DOB:1970/11/16, 52 y.o., female Today's Date: 09/13/2023   PCP: Sharlene Dory, NPPCP - General  REFERRING PROVIDER: Gwenlyn Perking, MD   END OF SESSION:   PT End of Session - 09/13/23 1019     Visit Number 3    Number of Visits 10    Authorization Type [employer]    Authorization Time Period auth requested 12/12; 18 visits total    Progress Note Due on Visit 10    PT Start Time 1019    PT Stop Time 1048    PT Time Calculation (min) 29 min    Equipment Utilized During Treatment Gait belt    Activity Tolerance Patient tolerated treatment well    Behavior During Therapy WFL for tasks assessed/performed             Past Medical History:  Diagnosis Date   Anemia    Anxiety    Asthma    Diabetes mellitus without complication (HCC)    Fibroids    Hypertension    Sickle cell trait (HCC)    Past Surgical History:  Procedure Laterality Date   ABDOMINAL HYSTERECTOMY N/A 10/04/2015   Procedure: HYSTERECTOMY ABDOMINAL;  Surgeon: Tilda Burrow, MD;  Location: [facility];  Service: Gynecology;  Laterality: N/A;   APPENDECTOMY     BILATERAL SALPINGECTOMY Bilateral 10/04/2015   Procedure: BILATERAL SALPINGECTOMY;  Surgeon: Tilda Burrow, MD;  Location: [facility];  Service: Gynecology;  Laterality: Bilateral;   ORIF ANKLE FRACTURE Right 02/28/2023   Procedure: OPEN REDUCTION INTERNAL FIXATION (ORIF) ANKLE FRACTURE;  Surgeon: Oliver Barre, MD;  Location: [facility];  Service: Orthopedics;  Laterality: Right;   TUBAL LIGATION      Patient Active Problem List   Diagnosis Date Noted   Sickle cell trait (HCC) 04/24/2023   DM (diabetes mellitus) type 2, uncontrolled, with ketoacidosis (HCC) 10/25/2018   DKA (diabetic ketoacidosis) (HCC) 10/24/2018   Hypertension associated with diabetes (HCC) 10/24/2018   AKI (acute kidney injury) (HCC) 10/24/2018   Hypokalemia 10/24/2018   Bronchitis 10/24/2018   S/P total abdominal hysterectomy 10/04/2015   Excessive or frequent menstruation 07/20/2013   OBESITY 01/07/2008   Tobacco use 01/07/2008    ONSET DATE: Right Thalamic CVA 08/08/23   REFERRING DIAG:  Free Text Diagnosis  CVA    THERAPY DIAG:  Other symptoms and signs involving the nervous system  Other lack of coordination  Unsteadiness on feet  Muscle weakness (generalized)  Rationale for Evaluation and Treatment: Rehabilitation  SUBJECTIVE:  SUBJECTIVE STATEMENT: No pain reported today; feels she is close to  back to normal.  "90%" improved  Patient s/p right sided thalamic stroke 08/08/23 . Patient was admitted for 1 day. Patient had MD f/u after hospital and was referred to OPPT for residual left sided weakness.   Pt accompanied by: self  PERTINENT HISTORY:  DM II HTN  Right ankle ORIF 2024  PAIN:  Are you having pain? Yes: NPRS scale: 2/10 Pain location: left abs Pain description: throb  Aggravating factors: stretching Relieving factors: n/a  PRECAUTIONS: None  RED FLAGS: None   WEIGHT BEARING RESTRICTIONS: No  FALLS: Has patient fallen in last 6 months? No  LIVING ENVIRONMENT: Lives with: lives with their spouse Lives in: House/apartment Stairs: Yes: External: 3 steps; can reach both Has following equipment at home: None  PLOF: Independent  PATIENT SURVEYS:  FOTO [phone]  PATIENT GOALS:  to return to work   OBJECTIVE:   DIAGNOSTIC FINDINGS: n/a   COGNITION: Overall cognitive status: Within functional limits for tasks assessed     SENSATION: WFL   COORDINATION:   Finger to nose: right hand WFL; left hand decreased Heel to shin: right WFL; left decreased   Visual  Left peripheral vision decreased  H -test: WFL Moderate left-sided neglect   POSTURE: No Significant postural limitations   FUNCTIONAL TESTS:  5 times sit to stand: 16.92 with upper extremity assist pus off lower extremity; "plops down into chair"     GAIT ANALYSIS: Gait pattern:  patient with wider BOS; Mod left sided neglect to environment  Distance walked: 15ft Assistive device utilized: None Level of assistance: Complete Independence Comments:    BED MOBILITY:  Sit to supine Complete Independence Supine to sit Complete Independence   LOWER EXTREMITY MMT:    MMT Right Eval Left Eval Right 09/04/23 Left 09/04/23 Right 09/13/23 Left 09/13/23  Hip flexion 3+/5 3/5   5 5   Hip extension   4 4 5 5   Hip abduction   4 4- 4+ 4+  Hip adduction        Hip internal rotation        Hip external rotation        Knee flexion 3+/5 3/5   4+ 4+  Knee extension 3+/5 3/5   5 5   Ankle dorsiflexion 3+/5 3+/5   5 5   Ankle plantarflexion        Ankle inversion        Ankle eversion        (Blank rows = not tested)    TODAY'S TREATMENT:                                                                                                                              DATE:  09/13/23 FOTO 98 5 times sit to stand 11.61 no UE assist MMT's see above   09/04/23:  Reviewed goals Educated importance of HEP compliance for maximal benefits MMT for gluts  STS no HHA eccentric control  Heel raises 10x  Toe raises 10x  Squat front of chair 10x Toe tapping 6in step height opposite UE flexion paired with LE 20x Tandem stance 2x 30" Tandem stance on foam 2x 30" SLS Lt 8", Rt 11" Throw/catch  scarf with opposite UE forward/ backward 2RT down hallway Sidestep with GTB 2RT  08/29/23  PT initial eval- low complexity  PATIENT EDUCATION: Education details: prognosis, activity modification  Person educated: Patient Education method: Explanation Education comprehension: verbalized understanding  HOME EXERCISE PROGRAM: TBD   GOALS: Goals reviewed with patient? No  SHORT TERM GOALS: Target date: 09/26/2023    Patient will be able to perform the 5TSTS(5 Times Sit to Stand) Test Sit within 16s seconds with minimal upper extremity  assistance from a standard height chair to improve ability to squat, lift objects negotiate stairs, and walk prolonged distances.  Baseline:12/27 11.61 sec Goal status: met     2.  Patient will be independent with a basic stretching/strengthening HEP Baseline:  Goal status: met   LONG TERM GOALS: 10/24/2023    Patient will be able to perform the 5TSTS(5 Times Sit to Stand) Test Sit within 14 seconds with NO upper extremity  assistance from a standard height chair to improve ability to squat, lift objects negotiate stairs, and walk prolonged distances.  Baseline: 09/13/23 11.61 no UE assist Goal status: met  2.    Patient will be able to score a >/=  70 on the FOTO to demonstrate decreased difficulty in ADL completion Baseline: 12/27 98 Goal status: met  3.   Patient will be independent with a comprehensive strengthening HEP  Baseline:  Goal status: met  ASSESSMENT:  CLINICAL IMPRESSION: Patient feels she is 90% improved so progress note done today and she has indeed met all set rehab goals and is agreeable to discharge at this time.    Patient is a 52 y.o. female who was seen today for physical therapy evaluation and treatment for s/p right sided thalamic CVA 08/08/23.   OBJECTIVE IMPAIRMENTS: Abnormal gait, decreased activity tolerance, decreased coordination, decreased endurance, difficulty walking, decreased strength, decreased  safety awareness, and pain.   ACTIVITY LIMITATIONS: carrying, lifting, standing, squatting, sleeping, stairs, transfers, hygiene/grooming, and locomotion level  PARTICIPATION LIMITATIONS: meal prep, cleaning, laundry, personal finances, driving, shopping, community activity, occupation, and yard work  PERSONAL FACTORS:     are also affecting patient's functional outcome.   REHAB POTENTIAL: Fair    CLINICAL DECISION MAKING: Stable/uncomplicated  EVALUATION COMPLEXITY: Low  PLAN:  PT FREQUENCY: 1x/week  PT DURATION: 8 weeks  PLANNED INTERVENTIONS: 97110-Therapeutic exercises, 97530- Therapeutic activity, 97112- Neuromuscular re-education, 97535- Self Care, 16109- Manual therapy, [phone]- Gait training, Patient/Family education, Balance training, Joint mobilization, Joint manipulation, Spinal manipulation, Spinal mobilization, Vestibular training, Visual/preceptual remediation/compensation, Cognitive remediation, Cryotherapy, and Moist heat  PLAN FOR NEXT SESSION: discharge  10:51 AM, 09/13/23 Atilla Zollner Small Klara Stjames MPT [facility] [place] [phone] Ph:[phone]

## 2023-09-16 ENCOUNTER — Other Ambulatory Visit: Payer: Self-pay | Admitting: Orthopedic Surgery

## 2023-09-17 ENCOUNTER — Encounter: Payer: Self-pay | Admitting: Neurology

## 2023-09-17 MED ORDER — TRAMADOL HCL 50 MG PO TABS: 50.0000 mg | ORAL_TABLET | Freq: Four times a day (QID) | ORAL | 0 refills | Status: DC | PRN

## 2023-09-19 ENCOUNTER — Ambulatory Visit (HOSPITAL_COMMUNITY): Payer: BC Managed Care – PPO

## 2023-09-19 ENCOUNTER — Encounter (HOSPITAL_COMMUNITY): Payer: Self-pay | Admitting: Occupational Therapy

## 2023-09-19 ENCOUNTER — Ambulatory Visit (HOSPITAL_COMMUNITY): Payer: BC Managed Care – PPO | Attending: Internal Medicine | Admitting: Occupational Therapy

## 2023-09-19 DIAGNOSIS — R278 Other lack of coordination: Secondary | ICD-10-CM | POA: Diagnosis present

## 2023-09-19 DIAGNOSIS — R29818 Other symptoms and signs involving the nervous system: Secondary | ICD-10-CM | POA: Diagnosis present

## 2023-09-19 NOTE — Therapy (Signed)
 OUTPATIENT OCCUPATIONAL THERAPY NEURO TREATMENT AND DISCHARGE NOTE  Patient Name: [name] MRN: [id number] DOB:[birth year], 53 y.o., female Today's Date: 09/19/2023  OCCUPATIONAL THERAPY DISCHARGE SUMMARY  Visits from Start of Care: 5  Current functional level related to goals / functional outcomes: Pt has met 5 out of 5 OT goals. Her strength and mobility are [facility], her coordination and grip/pinch strengthening are WNL.    Remaining deficits: Pt has no noted deficits at this time.    Education / Equipment: Pt has been provided a comprehensive HEP.   Plan: Patient agrees to discharge as all OT goals have been met.    END OF SESSION:  OT End of Session - 09/19/23 1038     Visit Number 5    Number of Visits 6    Date for OT Re-Evaluation 10/01/23    Authorization Type [employer]    Authorization Time Period 18 visit limit    Authorization - Visit Number 5    Authorization - Number of Visits 18    OT Start Time 1008    OT Stop Time 1038    OT Time Calculation (min) 30 min    Activity Tolerance Patient tolerated treatment well    Behavior During Therapy WFL for tasks assessed/performed             Past Medical History:  Diagnosis Date   Anemia    Anxiety    Asthma    Diabetes mellitus without complication (HCC)    Fibroids    Hypertension    Sickle cell trait (HCC)    Past Surgical History:  Procedure Laterality Date   ABDOMINAL HYSTERECTOMY N/A 10/04/2015   Procedure: HYSTERECTOMY ABDOMINAL;  Surgeon: Norleen Edsel GAILS, MD;  Location: [facility];  Service: Gynecology;  Laterality: N/A;   APPENDECTOMY     BILATERAL SALPINGECTOMY Bilateral 10/04/2015   Procedure: BILATERAL SALPINGECTOMY;  Surgeon: Norleen Edsel GAILS, MD;  Location: [facility];  Service: Gynecology;  Laterality: Bilateral;   ORIF ANKLE FRACTURE Right 02/28/2023   Procedure: OPEN REDUCTION INTERNAL FIXATION (ORIF) ANKLE FRACTURE;  Surgeon: Onesimo Oneil DELENA, MD;  Location: [facility];  Service: Orthopedics;   Laterality: Right;   TUBAL LIGATION     Patient Active Problem List   Diagnosis Date Noted   Sickle cell trait (HCC) 04/24/2023   DM (diabetes mellitus) type 2, uncontrolled, with ketoacidosis (HCC) 10/25/2018   DKA (diabetic ketoacidosis) (HCC) 10/24/2018   Hypertension associated with diabetes (HCC) 10/24/2018   AKI (acute kidney injury) (HCC) 10/24/2018   Hypokalemia 10/24/2018   Bronchitis 10/24/2018   S/P total abdominal hysterectomy 10/04/2015   Excessive or frequent menstruation 07/20/2013   OBESITY 01/07/2008   Tobacco use 01/07/2008   PCP: Vineetha Joelin, NP REFERRING PROVIDER: Dr. Skeet Bianchi  ONSET DATE: 08/08/23  REFERRING DIAG: s/p left CVA  THERAPY DIAG:  Other lack of coordination  Other symptoms and signs involving the nervous system  Rationale for Evaluation and Treatment: Rehabilitation  SUBJECTIVE:   SUBJECTIVE STATEMENT: S: I'm ready to get back to work.   PERTINENT HISTORY: Pt is a 53 y/o female s/p left CVA on 08/08/23. Pt reports symptoms were tingling/numbness in LUE and LLE, developed some weakness as well.   PRECAUTIONS: None  WEIGHT BEARING RESTRICTIONS: No  PAIN:  Are you having pain? No  FALLS: Has patient fallen in last 6 months? No  PLOF: Independent  PATIENT GOALS: To be able to use the left hand.   OBJECTIVE:  Note: Objective measures were completed  at Evaluation unless otherwise noted.  HAND DOMINANCE: Right  ADLs: Overall ADLs: Pt is having difficulty with holding cups-drops or holds too tight. Pt reports difficulty with fixing hair, tying shoes. Pt typically sleeps on stomach, reports unable to lay on stomach due to discomfort in her left side. Pt reports feeling overall weaker on the left side.    FUNCTIONAL OUTCOME MEASURES: Quick Dash: 45.45  UPPER EXTREMITY ROM:      ROM is WFL throughout LUE  UPPER EXTREMITY MMT:     MMT Left eval Left 09/19/23  Shoulder flexion 5/5 5/5  Shoulder abduction 5/5 5/5   Shoulder internal rotation 5/5 5/5  Shoulder external rotation 5/5 5/5  Elbow flexion 5/5 5/5  Elbow extension 4+/5 5/5  Wrist flexion 4/5 5/5  Wrist extension 5/5 5/5  Wrist ulnar deviation 4/5 5/5  Wrist radial deviation 4/5 5/5  Wrist pronation 4+/5 5/5  Wrist supination 5/5 5/5  (Blank rows = not tested)  HAND FUNCTION: Grip strength: Right: 50 lbs; Left: 30 lbs, Lateral pinch: Right: 16 lbs, Left: 12 lbs, and 3 point pinch: Right: 10 lbs, Left: 10 lbs 09/19/23: Grip strength: Left: 74 lbs, Lateral pinch: Left: 13 lbs, and 3 point pinch: Left: 16 lbs  COORDINATION: 9 Hole Peg test: Right: 29.88 sec; Left: 55.02 sec 09/19/23: 9 Hole Peg test: Left: 30.25 sec  SENSATION: Mild tingling   TODAY'S TREATMENT:                                                                                                                              DATE:   09/19/23 -Shoulder Strengthening: 3#, flexion, abduction, protraction, horizontal abduciton, er/IR, x15 -Strengthening: 3#, hammer curls, bicep curls, tricep kickbacks, x15 -Wrist strengthening: 3#, flexion, extension, ulnar/radial deviation, supination/pronation, x15 -9 hole peg test -Measurements for reassessment  09/13/23 -Shoulder strengthening: green theraband-protraction, flexion, abduction, er, IR, horizontal abduction, 10 reps -Job simulation: removing linen bag, dust mopping, wiping down tables, pt performing without difficulty, using RUE as dominant and guiding with LUE as needed -Grip strengthening: large beads at 42# with horizontal gripper, medium beads at 42# with gripper vertical -Therapy ball strengthening: standing, green ball-chest press, overhead press, flexion, circles each direction, 10 reps each -Grooved pegboard: pt attempting to use tweezers to place pegs into pegboard, max difficulty so downgraded to fingers. Pt holding coins in palm, working on in-hand manipulation and dexterity to move to fingertips and place into  pegboard. Increased time, occasionally dropping pegs.   09/04/23 -Shoulder strengthening: green theraband-protraction, flexion, abduction, er, IR, horizontal abduction, 10 reps -Scapular strengthening: green theraband-row, extension, retraction, 10 reps -ABC Writing: shoulder at 90 degrees flexion, 2# weight -Grip strengthening: large beads at 35# with horizontal gripper, medium beads at 35# with gripper vertical -Coin manipulation: pt holding coins in left hand, working on in-hand manipulation to translate to fingertips and place into slotted container. Intermittently dropping coins, completed 3 rounds -Pinch task: pt using green clothespin and  3 point pinch to grasp and stack 2 towers of 5 sponges     PATIENT EDUCATION: Education details: green theraband strengthening Person educated: Patient Education method: Explanation, Demonstration, and Handouts Education comprehension: verbalized understanding and returned demonstration  HOME EXERCISE PROGRAM: Eval: wrist strengthening, theraputty grip and pinch strengthening 12/12: Shoulder Strengthening 12/27: green theraband strengthening   GOALS: Goals reviewed with patient? Yes  SHORT TERM GOALS: Target date: 09/10/23  Pt will be provided with and educated on HEP to improve LUE strength required for use during ADLs.   Goal status: MET   LONG TERM GOALS: Target date: 10/01/23  Pt will increase LUE strength to 5/5 throughout to improve ability to lift and carry objects using LUE as non-dominant.   Goal status: MET  2.  Pt will increase left grip strength by 10# and pinch strength by 2# to improve ability to grasp and hold objects when completing housekeeping or meal preparation.   Goal status: MET  3.  Pt will improve left hand coordination required for tying shoes, fixing hair, by completing 9 hole peg test in under 40.   Goal status: MET    ASSESSMENT:  CLINICAL IMPRESSION: This session, pt presenting for her OT  session, reporting that she is feeling very good and close to her baseline, ready to go back to work. OT completed reassessment on pt, where she demonstrated improved strength, mobility, and coordination in all areas. She is preforming [facility] and feels that she has no further skilled therapy needs at this time. Pt will be discharged from OT at this time.   PERFORMANCE DEFICITS: in functional skills including ADLs, IADLs, coordination, strength, and UE functional use   PLAN:  OT FREQUENCY: 1x/week  OT DURATION: 6 weeks  PLANNED INTERVENTIONS: 97168 OT Re-evaluation, 97535 self care/ADL training, 02889 therapeutic exercise, 97530 therapeutic activity, 97112 neuromuscular re-education, 97032 electrical stimulation (manual), 97014 electrical stimulation unattended, patient/family education, and DME and/or AE instructions  RECOMMENDED OTHER SERVICES: None   CONSULTED AND AGREED WITH PLAN OF CARE: Patient  PLAN FOR NEXT SESSION: Discharge   Valentin Nightingale, OTR/L [phone] 09/19/2023, 10:57 AM

## 2023-09-20 ENCOUNTER — Encounter: Payer: Self-pay | Admitting: Neurology

## 2023-09-20 ENCOUNTER — Ambulatory Visit (INDEPENDENT_AMBULATORY_CARE_PROVIDER_SITE_OTHER): Payer: BC Managed Care – PPO | Admitting: Neurology

## 2023-09-20 VITALS — BP 122/80 | HR 65 | Ht 63.0 in | Wt 198.2 lb

## 2023-09-20 DIAGNOSIS — I6381 Other cerebral infarction due to occlusion or stenosis of small artery: Secondary | ICD-10-CM

## 2023-09-20 DIAGNOSIS — E785 Hyperlipidemia, unspecified: Secondary | ICD-10-CM

## 2023-09-20 DIAGNOSIS — I10 Essential (primary) hypertension: Secondary | ICD-10-CM

## 2023-09-20 DIAGNOSIS — E1110 Type 2 diabetes mellitus with ketoacidosis without coma: Secondary | ICD-10-CM

## 2023-09-20 DIAGNOSIS — I152 Hypertension secondary to endocrine disorders: Secondary | ICD-10-CM

## 2023-09-20 DIAGNOSIS — R002 Palpitations: Secondary | ICD-10-CM | POA: Diagnosis not present

## 2023-09-20 DIAGNOSIS — E1159 Type 2 diabetes mellitus with other circulatory complications: Secondary | ICD-10-CM

## 2023-09-20 NOTE — Progress Notes (Signed)
 NEUROLOGY FOLLOW UP OFFICE NOTE  [name] [id number]  Assessment/Plan:   Right thalamic infarct, most likely secondary to small vessel disease but cannot rule out cardioembolic source. Hypertension Type 2 diabetes mellitus, uncontrolled Palpitations Hyperlipidemia Intracranial stenosis, moderate Tobacco use disorder Snoring, concern for obstructive sleep apnea   Secondary stroke prevention: ASA 81mg  daily.  Stop Pavix Atorvastatin  80mg  daily.  LDL goal less than 70.  Repeat lipid panel in 1.5 months. Normotensive blood pressure Hgb A1c goal less than 7 Discussed tobacco cessation Mediterranean diet Refer to cardiology and sleep medicine Follow up 6 months.  She may now return to work without restrictions..     Subjective:  [name] is a 53 year old right-handed female with HTN, DM 2, Sickle cell trait and asthma who follows up for stroke.  She is accompanied by her husband. PT/OT notes reviewed.  UPDATE: Current medications:  ASA 81mg  daily, Plavix  75mg  daily, atorvastatin  80mg  daily, atenolol  Has not received a call to schedule with cardiology or sleep medicine   She has been discharged from PT and OT and has met all goals.  She has some mild residual tingling in the hand but strength, coordination and dexterity intact.     HISTORY: She woke up around 11 PM on 08/07/2023 with left upper extremity and left sided facial numbness.  No headache, blurred vision, speech disturbance, focal weakness or numbness involving the left lower extremity.  She was admitted to [facility] [place] and admitted for stroke for stroke.  Did not receive tPA or TNK.  CT head revealed no acute findings.  CTA of head and neck revealed moderate left intracranial ICA stenosis and moderate bilateral intradural vertebral artery stenosis proximally on the left and distally on the right, but no LVO.  MRI of brain revealed small acute right thalamic infarct.  2D echo showed LVEF >55%, grade 1  diastolic dysfunction and mild mitral annular calcification but no significant valvular disease or evidence of an interatrial flow communication or intrapulmonary shunt by agitated saline study.  LDL was 144.  Hgb A1c was 9.7.  She was discharged on ASA 81mg  daily and atorvastatin  20mg  daily.    She endorsed snoring and daytime fatigue.  She was referred for a sleep study but also has not received a call to schedule.  She is a smoker, smokes 1 ppd.  Since hospital discharge, she has been smoking only 4 cigarettes daily.  She continues to have left hemisensory loss involving face, torso, arm and leg.  Has difficulty walking and with dexterity of her left hand.  She reports some pain in the lower left lateral torso.    She reports that she occasionally feels palpitations.  This is recent, which began prior to her stroke.    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    PAST MEDICAL HISTORY: Past Medical History:  Diagnosis Date   Anemia    Anxiety    Asthma    Diabetes mellitus without complication (HCC)    Fibroids    Hypertension    Sickle cell trait (HCC)     MEDICATIONS: Current Outpatient Medications on File Prior to Visit  Medication Sig Dispense Refill   amLODipine  (NORVASC ) 10 MG tablet Take 1 tablet (10 mg total) by mouth every morning.     atenolol (TENORMIN) 50 MG tablet Take 50 mg by mouth daily.     atorvastatin  (LIPITOR) 80 MG tablet Take 1 tablet (  80 mg total) by mouth daily. 30 tablet 3   chlorhexidine  (PERIDEX ) 0.12 % solution Use as directed 15 mLs in the mouth or throat 2 (two) times daily. 120 mL 0   clopidogrel  (PLAVIX ) 75 MG tablet Take 1 tablet (75 mg total) by mouth daily. 17 tablet 0   escitalopram  (LEXAPRO ) 20 MG tablet Take 20 mg by mouth daily.      ibuprofen  (ADVIL ) 800 MG tablet Take 1 tablet (800 mg total) by mouth every 8 (eight) hours as needed. 60 tablet 2   Insulin  Glargine (LANTUS ) 100 UNIT/ML Solostar Pen  Inject 45 Units into the skin daily. (Patient taking differently: Inject 45 Units into the skin in the morning.) 15 mL 0   insulin  lispro (HUMALOG) 100 UNIT/ML KwikPen Inject 15 Units into the skin 3 (three) times daily before meals.     lisinopril-hydrochlorothiazide (PRINZIDE,ZESTORETIC) 10-12.5 MG tablet Take 1 tablet by mouth daily.  2   metFORMIN  (GLUCOPHAGE -XR) 500 MG 24 hr tablet Take 2 tablets (1,000 mg total) by mouth 2 (two) times daily with a meal for 30 days. 120 tablet 0   montelukast  (SINGULAIR ) 10 MG tablet Take 10 mg by mouth at bedtime.      naproxen  (NAPROSYN ) 375 MG tablet Take 1 tablet (375 mg total) by mouth 2 (two) times daily. 20 tablet 0   potassium chloride  (KLOR-CON  M) 10 MEQ tablet Take 1 tablet (10 mEq total) by mouth 2 (two) times daily for 5 days. 10 tablet 0   tiZANidine  (ZANAFLEX ) 4 MG tablet Take 1 tablet (4 mg total) by mouth at bedtime. 30 tablet 0   tizanidine  (ZANAFLEX ) 6 MG capsule Take 1 capsule (6 mg total) by mouth 3 (three) times daily as needed for muscle spasms. Do not drink alcohol or drive while taking this medication. May cause drowsiness 15 capsule 0   traMADol  (ULTRAM ) 50 MG tablet Take 1 tablet (50 mg total) by mouth every 6 (six) hours as needed. 20 tablet 0   No current facility-administered medications on file prior to visit.    ALLERGIES: No Known Allergies  FAMILY HISTORY: Family History  Problem Relation Age of Onset   Hypertension Mother    Diabetes Father    Stroke Brother    Migraines Brother    Asthma Brother    Stroke Brother    Allergic rhinitis Neg Hx    Eczema Neg Hx    Immunodeficiency Neg Hx    Urticaria Neg Hx       Objective:  Blood pressure 122/80, pulse 65, height 5' 3 (1.6 m), weight 198 lb 3.2 oz (89.9 kg), last menstrual period 06/23/2015, SpO2 98%. General: No acute distress.  Patient appears well-groomed.   Head:  Normocephalic/atraumatic Eyes:  Fundi examined but not visualized Neck: supple, no  paraspinal tenderness, full range of motion Heart:  Regular rate and rhythm Lungs:  Clear to auscultation bilaterally Back: No paraspinal tenderness Neurological Exam: alert and oriented.  Speech fluent and not dysarthric, language intact.  CN II-XII intact. Bulk and tone normal, muscle strength 5/5 throughout.  Sensation to pinprick and vibration intact.  Deep tendon reflexes 2+ throughout, toes downgoing.  Finger to nose testing intact.  Gait normal, Romberg negative.   Juliene Dunnings, DO  CC: Vineetha Joelin, NP

## 2023-09-23 ENCOUNTER — Other Ambulatory Visit (HOSPITAL_COMMUNITY): Payer: Self-pay | Attending: Medical Genetics

## 2023-09-26 ENCOUNTER — Ambulatory Visit (HOSPITAL_COMMUNITY): Payer: BC Managed Care – PPO

## 2023-09-26 ENCOUNTER — Encounter (HOSPITAL_COMMUNITY): Payer: BC Managed Care – PPO | Admitting: Occupational Therapy

## 2023-10-02 ENCOUNTER — Encounter (HOSPITAL_COMMUNITY): Payer: BC Managed Care – PPO | Admitting: Occupational Therapy

## 2023-10-02 ENCOUNTER — Ambulatory Visit (HOSPITAL_COMMUNITY): Payer: BC Managed Care – PPO

## 2023-10-04 ENCOUNTER — Ambulatory Visit: Payer: BC Managed Care – PPO | Admitting: Orthopedic Surgery

## 2023-10-09 ENCOUNTER — Ambulatory Visit (HOSPITAL_COMMUNITY): Payer: BC Managed Care – PPO

## 2023-10-09 ENCOUNTER — Encounter (HOSPITAL_COMMUNITY): Payer: BC Managed Care – PPO | Admitting: Occupational Therapy

## 2023-10-16 ENCOUNTER — Encounter (HOSPITAL_COMMUNITY): Payer: BC Managed Care – PPO | Admitting: Occupational Therapy

## 2023-10-16 ENCOUNTER — Ambulatory Visit (HOSPITAL_COMMUNITY): Payer: BC Managed Care – PPO

## 2023-10-23 ENCOUNTER — Encounter (HOSPITAL_COMMUNITY): Payer: BC Managed Care – PPO | Admitting: Occupational Therapy

## 2023-10-23 ENCOUNTER — Ambulatory Visit (HOSPITAL_COMMUNITY): Payer: BC Managed Care – PPO

## 2023-11-14 ENCOUNTER — Ambulatory Visit: Payer: BC Managed Care – PPO | Admitting: Neurology

## 2024-01-10 ENCOUNTER — Ambulatory Visit: Payer: BC Managed Care – PPO | Attending: Internal Medicine | Admitting: Internal Medicine

## 2024-01-10 NOTE — Progress Notes (Signed)
 Erroneous encounter - please disregard.

## 2024-03-18 NOTE — Progress Notes (Deleted)
 NEUROLOGY FOLLOW UP OFFICE NOTE  ROSHELLE TRAUB [id number]  Assessment/Plan:   Right thalamic infarct, most likely secondary to small vessel disease but cannot rule out cardioembolic source. Hypertension Type 2 diabetes mellitus, uncontrolled Palpitations Hyperlipidemia Intracranial stenosis, moderate Tobacco use disorder Snoring, concern for obstructive sleep apnea   Secondary stroke prevention: ASA 81mg  daily.  Stop Pavix Atorvastatin  80mg  daily.  LDL goal less than 70.  Repeat lipid panel in 1.5 months. Normotensive blood pressure Hgb A1c goal less than 7 Discussed tobacco cessation Mediterranean diet Refer to cardiology and sleep medicine Follow up 6 months.  She may now return to work without restrictions..     Subjective:  Anyra Kaufman Karnes is a 53 year old right-handed female with HTN, DM 2, Sickle cell trait and asthma who follows up for stroke.  She is accompanied by her husband.   UPDATE: Current medications:  ASA 81mg  daily, Plavix  75mg  daily, atorvastatin  80mg  daily, atenolol  Has not received a call to schedule with cardiology or sleep medicine   She has some mild residual tingling in the hand but strength, coordination and dexterity intact.     HISTORY: She woke up around 11 PM on 08/07/2023 with left upper extremity and left sided facial numbness.  No headache, blurred vision, speech disturbance, focal weakness or numbness involving the left lower extremity.  She was admitted to [facility] and admitted for stroke for stroke.  Did not receive tPA or TNK.  CT head revealed no acute findings.  CTA of head and neck revealed moderate left intracranial ICA stenosis and moderate bilateral intradural vertebral artery stenosis proximally on the left and distally on the right, but no LVO.  MRI of brain revealed small acute right thalamic infarct.  2D echo showed LVEF >55%, grade 1 diastolic dysfunction and mild mitral annular calcification but no significant  valvular disease or evidence of an interatrial flow communication or intrapulmonary shunt by agitated saline study.  LDL was 144.  Hgb A1c was 9.7.  She was discharged on ASA 81mg  daily and atorvastatin  20mg  daily.    She endorsed snoring and daytime fatigue.  She was referred for a sleep study but also has not received a call to schedule.  She is a smoker, smokes 1 ppd.  Since hospital discharge, she has been smoking only 4 cigarettes daily.  She continues to have left hemisensory loss involving face, torso, arm and leg.  Has difficulty walking and with dexterity of her left hand.  She reports some pain in the lower left lateral torso.    She reports that she occasionally feels palpitations.  This is recent, which began prior to her stroke.    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    PAST MEDICAL HISTORY: Past Medical History:  Diagnosis Date   Anemia    Anxiety    Asthma    Diabetes mellitus without complication (HCC)    Fibroids    Hypertension    Sickle cell trait (HCC)     MEDICATIONS: Current Outpatient Medications on File Prior to Visit  Medication Sig Dispense Refill   amLODipine  (NORVASC ) 10 MG tablet Take 1 tablet (10 mg total) by mouth every morning.     atenolol (TENORMIN) 50 MG tablet Take 50 mg by mouth daily.     atorvastatin  (LIPITOR) 80 MG tablet Take 1 tablet (80 mg total) by mouth daily. 30 tablet 3   chlorhexidine  (PERIDEX ) 0.12 % solution  Use as directed 15 mLs in the mouth or throat 2 (two) times daily. 120 mL 0   clopidogrel  (PLAVIX ) 75 MG tablet Take 1 tablet (75 mg total) by mouth daily. 17 tablet 0   escitalopram  (LEXAPRO ) 20 MG tablet Take 20 mg by mouth daily.      ibuprofen  (ADVIL ) 800 MG tablet Take 1 tablet (800 mg total) by mouth every 8 (eight) hours as needed. 60 tablet 2   Insulin  Glargine (LANTUS ) 100 UNIT/ML Solostar Pen Inject 45 Units into the skin daily. (Patient taking differently: Inject 45  Units into the skin in the morning.) 15 mL 0   insulin  lispro (HUMALOG) 100 UNIT/ML KwikPen Inject 15 Units into the skin 3 (three) times daily before meals.     lisinopril-hydrochlorothiazide (PRINZIDE,ZESTORETIC) 10-12.5 MG tablet Take 1 tablet by mouth daily.  2   metFORMIN  (GLUCOPHAGE -XR) 500 MG 24 hr tablet Take 2 tablets (1,000 mg total) by mouth 2 (two) times daily with a meal for 30 days. 120 tablet 0   montelukast  (SINGULAIR ) 10 MG tablet Take 10 mg by mouth at bedtime.      naproxen  (NAPROSYN ) 375 MG tablet Take 1 tablet (375 mg total) by mouth 2 (two) times daily. 20 tablet 0   potassium chloride  (KLOR-CON  M) 10 MEQ tablet Take 1 tablet (10 mEq total) by mouth 2 (two) times daily for 5 days. 10 tablet 0   tiZANidine  (ZANAFLEX ) 4 MG tablet Take 1 tablet (4 mg total) by mouth at bedtime. 30 tablet 0   tizanidine  (ZANAFLEX ) 6 MG capsule Take 1 capsule (6 mg total) by mouth 3 (three) times daily as needed for muscle spasms. Do not drink alcohol or drive while taking this medication. May cause drowsiness 15 capsule 0   traMADol  (ULTRAM ) 50 MG tablet Take 1 tablet (50 mg total) by mouth every 6 (six) hours as needed. 20 tablet 0   No current facility-administered medications on file prior to visit.    ALLERGIES: No Known Allergies  FAMILY HISTORY: Family History  Problem Relation Age of Onset   Hypertension Mother    Diabetes Father    Stroke Brother    Migraines Brother    Asthma Brother    Stroke Brother    Allergic rhinitis Neg Hx    Eczema Neg Hx    Immunodeficiency Neg Hx    Urticaria Neg Hx       Objective:  *** General: No acute distress.  Patient appears well-groomed.   Head:  Normocephalic/atraumatic Neck:  Supple.  No paraspinal tenderness.  Full range of motion. Heart:  Regular rate and rhythm. Neuro:  Alert and oriented.  Speech fluent and not dysarthric.  Language intact.  CN II-XII intact.  Bulk and tone normal.  Muscle strength 5/5 throughout.  Sensation to  pinprick and vibration intact.  Deep tendon reflexes 2+ throughout, toes downgoing.  Gait normal.  Romberg negative.    Juliene Dunnings, DO  CC: Vineetha Joelin, NP

## 2024-03-19 ENCOUNTER — Other Ambulatory Visit (HOSPITAL_COMMUNITY): Payer: Self-pay | Admitting: Family

## 2024-03-19 ENCOUNTER — Ambulatory Visit: Payer: BC Managed Care – PPO | Admitting: Neurology

## 2024-03-19 DIAGNOSIS — F17210 Nicotine dependence, cigarettes, uncomplicated: Secondary | ICD-10-CM

## 2024-03-19 NOTE — Progress Notes (Deleted)
 NEUROLOGY FOLLOW UP OFFICE NOTE  [name] [id number]  Assessment/Plan:   Right thalamic infarct, most likely secondary to small vessel disease but cannot rule out cardioembolic source. Hypertension Type 2 diabetes mellitus, uncontrolled Palpitations Hyperlipidemia Intracranial stenosis, moderate Tobacco use disorder Snoring, concern for obstructive sleep apnea   Secondary stroke prevention: ASA 81mg  daily.  Stop Pavix Atorvastatin  80mg  daily.  LDL goal less than 70.  Repeat lipid panel in 1.5 months. Normotensive blood pressure Hgb A1c goal less than 7 Discussed tobacco cessation Mediterranean diet Refer to cardiology and sleep medicine Follow up 6 months.  She may now return to work without restrictions..     Subjective:  [name] is a 53 year old right-handed female with HTN, DM 2, Sickle cell trait and asthma who follows up for stroke.  She is accompanied by her husband.   UPDATE: Current medications:  ASA 81mg  daily, Plavix  75mg  daily, atorvastatin  80mg  daily, atenolol  Has not received a call to schedule with cardiology or sleep medicine   She has some mild residual tingling in the hand but strength, coordination and dexterity intact.     HISTORY: She woke up around 11 PM on 08/07/2023 with left upper extremity and left sided facial numbness.  No headache, blurred vision, speech disturbance, focal weakness or numbness involving the left lower extremity.  She was admitted to [facility] and admitted for stroke for stroke.  Did not receive tPA or TNK.  CT head revealed no acute findings.  CTA of head and neck revealed moderate left intracranial ICA stenosis and moderate bilateral intradural vertebral artery stenosis proximally on the left and distally on the right, but no LVO.  MRI of brain revealed small acute right thalamic infarct.  2D echo showed LVEF >55%, grade 1 diastolic dysfunction and mild mitral annular calcification but no significant  valvular disease or evidence of an interatrial flow communication or intrapulmonary shunt by agitated saline study.  LDL was 144.  Hgb A1c was 9.7.  She was discharged on ASA 81mg  daily and atorvastatin  20mg  daily.    She endorsed snoring and daytime fatigue.  She was referred for a sleep study but also has not received a call to schedule.  She is a smoker, smokes 1 ppd.  Since hospital discharge, she has been smoking only 4 cigarettes daily.  She continues to have left hemisensory loss involving face, torso, arm and leg.  Has difficulty walking and with dexterity of her left hand.  She reports some pain in the lower left lateral torso.    She reports that she occasionally feels palpitations.  This is recent, which began prior to her stroke.    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    PAST MEDICAL HISTORY: Past Medical History:  Diagnosis Date   Anemia    Anxiety    Asthma    Diabetes mellitus without complication (HCC)    Fibroids    Hypertension    Sickle cell trait (HCC)     MEDICATIONS: Current Outpatient Medications on File Prior to Visit  Medication Sig Dispense Refill   amLODipine  (NORVASC ) 10 MG tablet Take 1 tablet (10 mg total) by mouth every morning.     atenolol (TENORMIN) 50 MG tablet Take 50 mg by mouth daily.     atorvastatin  (LIPITOR) 80 MG tablet Take 1 tablet (80 mg total) by mouth daily. 30 tablet 3   chlorhexidine  (PERIDEX ) 0.12 % solution  Use as directed 15 mLs in the mouth or throat 2 (two) times daily. 120 mL 0   clopidogrel  (PLAVIX ) 75 MG tablet Take 1 tablet (75 mg total) by mouth daily. 17 tablet 0   escitalopram  (LEXAPRO ) 20 MG tablet Take 20 mg by mouth daily.      ibuprofen  (ADVIL ) 800 MG tablet Take 1 tablet (800 mg total) by mouth every 8 (eight) hours as needed. 60 tablet 2   Insulin  Glargine (LANTUS ) 100 UNIT/ML Solostar Pen Inject 45 Units into the skin daily. (Patient taking differently: Inject 45  Units into the skin in the morning.) 15 mL 0   insulin  lispro (HUMALOG) 100 UNIT/ML KwikPen Inject 15 Units into the skin 3 (three) times daily before meals.     lisinopril-hydrochlorothiazide (PRINZIDE,ZESTORETIC) 10-12.5 MG tablet Take 1 tablet by mouth daily.  2   metFORMIN  (GLUCOPHAGE -XR) 500 MG 24 hr tablet Take 2 tablets (1,000 mg total) by mouth 2 (two) times daily with a meal for 30 days. 120 tablet 0   montelukast  (SINGULAIR ) 10 MG tablet Take 10 mg by mouth at bedtime.      naproxen  (NAPROSYN ) 375 MG tablet Take 1 tablet (375 mg total) by mouth 2 (two) times daily. 20 tablet 0   potassium chloride  (KLOR-CON  M) 10 MEQ tablet Take 1 tablet (10 mEq total) by mouth 2 (two) times daily for 5 days. 10 tablet 0   tiZANidine  (ZANAFLEX ) 4 MG tablet Take 1 tablet (4 mg total) by mouth at bedtime. 30 tablet 0   tizanidine  (ZANAFLEX ) 6 MG capsule Take 1 capsule (6 mg total) by mouth 3 (three) times daily as needed for muscle spasms. Do not drink alcohol or drive while taking this medication. May cause drowsiness 15 capsule 0   traMADol  (ULTRAM ) 50 MG tablet Take 1 tablet (50 mg total) by mouth every 6 (six) hours as needed. 20 tablet 0   No current facility-administered medications on file prior to visit.    ALLERGIES: No Known Allergies  FAMILY HISTORY: Family History  Problem Relation Age of Onset   Hypertension Mother    Diabetes Father    Stroke Brother    Migraines Brother    Asthma Brother    Stroke Brother    Allergic rhinitis Neg Hx    Eczema Neg Hx    Immunodeficiency Neg Hx    Urticaria Neg Hx       Objective:  *** General: No acute distress.  Patient appears well-groomed.   Head:  Normocephalic/atraumatic Neck:  Supple.  No paraspinal tenderness.  Full range of motion. Heart:  Regular rate and rhythm. Neuro:  Alert and oriented.  Speech fluent and not dysarthric.  Language intact.  CN II-XII intact.  Bulk and tone normal.  Muscle strength 5/5 throughout.  Sensation to  pinprick and vibration intact.  Deep tendon reflexes 2+ throughout, toes downgoing.  Gait normal.  Romberg negative.    Juliene Dunnings, DO  CC: Vineetha Joelin, NP

## 2024-03-23 ENCOUNTER — Ambulatory Visit: Admitting: Neurology

## 2024-03-23 ENCOUNTER — Encounter: Payer: Self-pay | Admitting: Neurology

## 2024-03-23 DIAGNOSIS — Z029 Encounter for administrative examinations, unspecified: Secondary | ICD-10-CM

## 2024-03-25 ENCOUNTER — Other Ambulatory Visit (HOSPITAL_COMMUNITY): Payer: Self-pay | Admitting: Family

## 2024-03-25 DIAGNOSIS — Z1231 Encounter for screening mammogram for malignant neoplasm of breast: Secondary | ICD-10-CM

## 2024-04-02 ENCOUNTER — Ambulatory Visit (HOSPITAL_COMMUNITY)
Admission: RE | Admit: 2024-04-02 | Discharge: 2024-04-02 | Disposition: A | Discharge status: Home / Self Care | Source: Ambulatory Visit | Attending: Family | Admitting: Family

## 2024-04-02 ENCOUNTER — Encounter (HOSPITAL_COMMUNITY): Payer: Self-pay

## 2024-04-02 DIAGNOSIS — Z1231 Encounter for screening mammogram for malignant neoplasm of breast: Secondary | ICD-10-CM | POA: Insufficient documentation

## 2024-07-17 ENCOUNTER — Other Ambulatory Visit: Payer: Self-pay | Admitting: Medical Genetics

## 2024-07-17 DIAGNOSIS — Z006 Encounter for examination for normal comparison and control in clinical research program: Secondary | ICD-10-CM

## 2024-08-06 ENCOUNTER — Encounter (INDEPENDENT_AMBULATORY_CARE_PROVIDER_SITE_OTHER): Payer: Self-pay | Admitting: *Deleted

## 2024-09-29 NOTE — Progress Notes (Unsigned)
 "  Virtual Visit via Video Note:   Consent was obtained for video visit:  Yes.   Answered questions that patient had about telehealth interaction:  Yes.   I discussed the limitations, risks, security and privacy concerns of performing an evaluation and management service by telemedicine. I also discussed with the patient that there may be a patient responsible charge related to this service. The patient expressed understanding and agreed to proceed.  Pt location: Home Physician Location: office Name of referring provider:  Joelin, Vineetha, NP I connected with Marliss A Coyt at patients initiation/request on 10/01/2024 at 10:30 AM EST by video enabled telemedicine application and verified that I am speaking with the correct person using two identifiers. Pt MRN:  [id number] Pt DOB:  December 13, 1970 Video Participants:  Lonell A Silbaugh   Assessment/Plan:   Right thalamic infarct, most likely secondary to small vessel disease. Hypertension Type 2 diabetes mellitus, uncontrolled Palpitations Hyperlipidemia Intracranial stenosis, moderate Tobacco use disorder Snoring, concern for obstructive sleep apnea   Secondary stroke prevention: ASA 81mg  daily.  Atorvastatin  80mg  daily.  LDL goal less than 70.  Repeat lipid panel in 1.5 months. Normotensive blood pressure Hgb A1c goal less than 7 Discussed tobacco cessation Mediterranean diet For the arm and leg tightness, switch tizanidine  to baclofen  10mg  three times daily as needed.  If ineffective, referral to PM&R for consideration of Botox. Will hold off on referral to cardiology and sleep medicine as she is doing well. Follow up 6 months.    Subjective:  [name] is a 54 year old right-handed female with HTN, DM 2, Sickle cell trait and asthma who follows up for stroke.  She is accompanied by her husband.  UPDATE: Current medications:  ASA 81mg  daily, atorvastatin  80mg  daily, atenolol  Has not received a call to schedule with  cardiology or sleep medicine.  However, she denies palpitations and excessive daytime sleepiness for a year now.     She reports increased tightness in the left arm and leg, despite exercises.  Biofreeze ineffective.  Hot water eases it but still hurts.  Wearing braces at work.     HISTORY: She woke up around 11 PM on 08/07/2023 with left upper extremity and left sided facial numbness.  No headache, blurred vision, speech disturbance, focal weakness or numbness involving the left lower extremity.  She was admitted to [facility] and admitted for stroke for stroke.  Did not receive tPA or TNK.  CT head revealed no acute findings.  CTA of head and neck revealed moderate left intracranial ICA stenosis and moderate bilateral intradural vertebral artery stenosis proximally on the left and distally on the right, but no LVO.  MRI of brain revealed small acute right thalamic infarct.  2D echo showed LVEF >55%, grade 1 diastolic dysfunction and mild mitral annular calcification but no significant valvular disease or evidence of an interatrial flow communication or intrapulmonary shunt by agitated saline study.  LDL was 144.  Hgb A1c was 9.7.  She was discharged on ASA 81mg  daily and atorvastatin  20mg  daily.    She endorsed snoring and daytime fatigue.  She was referred for a sleep study but also has not received a call to schedule.  She is a smoker, smokes 1 ppd.  Since hospital discharge, she has been smoking only 4 cigarettes daily.  She continues to have left hemisensory loss involving face, torso, arm and leg.  Has difficulty walking and with dexterity of her left hand.  She reports some pain  in the lower left lateral torso.    She reports that she occasionally feels palpitations.  This is recent, which began prior to her stroke.    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    Past Medical History: Past Medical History:  Diagnosis Date   Anemia     Anxiety    Asthma    Diabetes mellitus without complication (HCC)    Fibroids    Hypertension    Sickle cell trait     Medications: Outpatient Encounter Medications as of 10/01/2024  Medication Sig   amLODipine  (NORVASC ) 10 MG tablet Take 1 tablet (10 mg total) by mouth every morning.   atenolol (TENORMIN) 50 MG tablet Take 50 mg by mouth daily.   atorvastatin  (LIPITOR) 80 MG tablet Take 1 tablet (80 mg total) by mouth daily.   chlorhexidine  (PERIDEX ) 0.12 % solution Use as directed 15 mLs in the mouth or throat 2 (two) times daily.   clopidogrel  (PLAVIX ) 75 MG tablet Take 1 tablet (75 mg total) by mouth daily.   escitalopram  (LEXAPRO ) 20 MG tablet Take 20 mg by mouth daily.    ibuprofen  (ADVIL ) 800 MG tablet Take 1 tablet (800 mg total) by mouth every 8 (eight) hours as needed.   Insulin  Glargine (LANTUS ) 100 UNIT/ML Solostar Pen Inject 45 Units into the skin daily. (Patient taking differently: Inject 45 Units into the skin in the morning.)   insulin  lispro (HUMALOG) 100 UNIT/ML KwikPen Inject 15 Units into the skin 3 (three) times daily before meals.   lisinopril-hydrochlorothiazide (PRINZIDE,ZESTORETIC) 10-12.5 MG tablet Take 1 tablet by mouth daily.   metFORMIN  (GLUCOPHAGE -XR) 500 MG 24 hr tablet Take 2 tablets (1,000 mg total) by mouth 2 (two) times daily with a meal for 30 days.   montelukast  (SINGULAIR ) 10 MG tablet Take 10 mg by mouth at bedtime.    naproxen  (NAPROSYN ) 375 MG tablet Take 1 tablet (375 mg total) by mouth 2 (two) times daily. (Patient taking differently: Take 375 mg by mouth as needed.)   [DISCONTINUED] traMADol  (ULTRAM ) 50 MG tablet Take 1 tablet (50 mg total) by mouth every 6 (six) hours as needed.   tiZANidine  (ZANAFLEX ) 4 MG tablet Take 1 tablet (4 mg total) by mouth at bedtime. (Patient not taking: Reported on 10/01/2024)   tizanidine  (ZANAFLEX ) 6 MG capsule Take 1 capsule (6 mg total) by mouth 3 (three) times daily as needed for muscle spasms. Do not drink alcohol  or drive while taking this medication. May cause drowsiness (Patient not taking: Reported on 10/01/2024)   [DISCONTINUED] potassium chloride  (KLOR-CON  M) 10 MEQ tablet Take 1 tablet (10 mEq total) by mouth 2 (two) times daily for 5 days.   No facility-administered encounter medications on file as of 10/01/2024.    Allergies: Allergies[1]  Family History: Family History  Problem Relation Age of Onset   Hypertension Mother    Diabetes Father    Stroke Brother    Migraines Brother    Asthma Brother    Stroke Brother    Allergic rhinitis Neg Hx    Eczema Neg Hx    Immunodeficiency Neg Hx    Urticaria Neg Hx     Observations/Objective:   No acute distress.  Alert and oriented.  Speech fluent and not dysarthric.  Language intact.  Eyes orthophoric on primary gaze.  Face symmetric.   Follow up Instructions:      -I discussed the assessment and treatment plan with the  patient. The patient was provided an opportunity to ask questions and all were answered. The patient agreed with the plan and demonstrated an understanding of the instructions.   The patient was advised to call back or seek an in-person evaluation if the symptoms worsen or if the condition fails to improve as anticipated.   Juliene Lamar Dunnings, DO   CC: Vineetha Joelin, NP          [1] No Known Allergies  "

## 2024-10-01 ENCOUNTER — Telehealth: Admitting: Neurology

## 2024-10-01 DIAGNOSIS — Z8673 Personal history of transient ischemic attack (TIA), and cerebral infarction without residual deficits: Secondary | ICD-10-CM | POA: Diagnosis not present

## 2024-10-01 DIAGNOSIS — I10 Essential (primary) hypertension: Secondary | ICD-10-CM

## 2024-10-01 DIAGNOSIS — E785 Hyperlipidemia, unspecified: Secondary | ICD-10-CM | POA: Diagnosis not present

## 2024-10-01 DIAGNOSIS — E119 Type 2 diabetes mellitus without complications: Secondary | ICD-10-CM

## 2024-10-01 DIAGNOSIS — E1110 Type 2 diabetes mellitus with ketoacidosis without coma: Secondary | ICD-10-CM

## 2024-10-01 DIAGNOSIS — I6381 Other cerebral infarction due to occlusion or stenosis of small artery: Secondary | ICD-10-CM

## 2024-10-01 MED ORDER — BACLOFEN 10 MG PO TABS: 10.0000 mg | ORAL_TABLET | Freq: Three times a day (TID) | ORAL | 5 refills | Status: AC | PRN

## 2024-10-01 NOTE — Patient Instructions (Addendum)
"   take baclofen  for the arm and leg tightness - 1 pill up to three times daily as needed.  If ineffective would refer to physical medicine and rehab for consideration of Botox "

## 2025-04-13 ENCOUNTER — Ambulatory Visit: Payer: Self-pay | Admitting: Neurology
